# Patient Record
Sex: Female | Born: 1965 | Race: White | Hispanic: No | Marital: Single | Smoking: Never smoker
Health system: Southern US, Community
[De-identification: ages and names within clinical notes are randomized; demographics above are authoritative.]

## PROBLEM LIST (undated history)

## (undated) DIAGNOSIS — C569 Malignant neoplasm of unspecified ovary: Secondary | ICD-10-CM

## (undated) DIAGNOSIS — N390 Urinary tract infection, site not specified: Secondary | ICD-10-CM

## (undated) DIAGNOSIS — E210 Primary hyperparathyroidism: Secondary | ICD-10-CM

## (undated) DIAGNOSIS — R112 Nausea with vomiting, unspecified: Secondary | ICD-10-CM

## (undated) DIAGNOSIS — I10 Essential (primary) hypertension: Secondary | ICD-10-CM

## (undated) DIAGNOSIS — Z9889 Other specified postprocedural states: Secondary | ICD-10-CM

## (undated) DIAGNOSIS — C801 Malignant (primary) neoplasm, unspecified: Secondary | ICD-10-CM

## (undated) DIAGNOSIS — E042 Nontoxic multinodular goiter: Secondary | ICD-10-CM

## (undated) DIAGNOSIS — C55 Malignant neoplasm of uterus, part unspecified: Secondary | ICD-10-CM

## (undated) HISTORY — DX: Primary hyperparathyroidism: E21.0

## (undated) HISTORY — PX: CHOLECYSTECTOMY: SHX55

## (undated) HISTORY — DX: Nontoxic multinodular goiter: E04.2

## (undated) HISTORY — DX: Essential (primary) hypertension: I10

## (undated) HISTORY — DX: Malignant neoplasm of unspecified ovary: C56.9

---

## 2009-10-05 ENCOUNTER — Encounter: Admission: RE | Admit: 2009-10-05 | Discharge: 2009-10-05 | Payer: Self-pay | Admitting: Family Medicine

## 2010-02-18 ENCOUNTER — Encounter: Payer: Self-pay | Admitting: Family Medicine

## 2013-09-02 ENCOUNTER — Ambulatory Visit: Payer: BC Managed Care – PPO | Attending: Gynecologic Oncology | Admitting: Gynecologic Oncology

## 2013-09-02 ENCOUNTER — Encounter: Payer: Self-pay | Admitting: Gynecologic Oncology

## 2013-09-02 VITALS — BP 163/97 | HR 98 | Resp 16 | Ht 62.0 in | Wt 288.5 lb

## 2013-09-02 DIAGNOSIS — C541 Malignant neoplasm of endometrium: Secondary | ICD-10-CM

## 2013-09-02 DIAGNOSIS — C55 Malignant neoplasm of uterus, part unspecified: Secondary | ICD-10-CM | POA: Insufficient documentation

## 2013-09-02 DIAGNOSIS — C549 Malignant neoplasm of corpus uteri, unspecified: Secondary | ICD-10-CM

## 2013-09-02 NOTE — Progress Notes (Signed)
Consult Note: Gyn-Onc  Consult was requested by Dr. Marvel Plan for the evaluation of Shannon [name] 48 y.o. female  CC:  Chief Complaint  Patient presents with  . Uterine cancer    Assessment/Plan:  [name]  is a 48 y.o.  year old who is seen at the request of Dr Marvel Plan for grade 1 endometrial cancer in the setting of anovulatory bleeding and morbid obesity.  A detailed discussion was held with the patient and her family with regard to to her endometrial cancer diagnosis. We discussed the standard management options for uterine cancer which includes surgery followed possibly by adjuvant therapy depending on the results of surgery. The options for surgical management include a hysterectomy and removal of the tubes and ovaries possibly with removal of pelvic and para-aortic lymph nodes. A minimally invasive approach including a robotic hysterectomy or laparoscopic hysterectomy have benefits including shorter hospital stay, recovery time and better wound healing. The alternative approach is an open hysterectomy. The patient has been counseled about these surgical options and the risks of surgery in general including infection, bleeding, damage to surrounding structures (including bowel, bladder, ureters, nerves or vessels), and the postoperative risks of PE/ DVT, and lymphedema. I extensively reviewed the additional risks of robotic hysterectomy including possible need for conversion to open laparotomy.  I discussed positioning during surgery of trendelenberg and risks of minor facial swelling and care we take in preoperative positioning.  After counseling and consideration of her options, she desires to proceed with robotic hysterectomy, bilateral salpingo-oophorectomy and pelvic lymphadenectomy on 09/28/13. I discussed that she will be postmenopausal postoperatively I discussed that if her staging reveals an early stage lesion, it is safe to provide her with estrogen replacement therapy  postop until age of natural menopause. I discussed that after hysterectomy she will no longer be able to conceive a biologic child (she will have permanent infertility).  She will be seen by anesthesia for preoperative clearance and discussion of postoperative pain management.  She was given the opportunity to ask questions, which were answered to her satisfaction, and she is agreement with the above mentioned plan of care.    HPI: Shannon [name] is a 48 year old woman who has a 5 year history of abnormal uterine bleeding. She has experienced long episodes of amenorrhea followed by menorrhagia. Approximate 3 years ago she was treated with oral contraceptive pills to control cycling. This helped somewhat. In July 2015 she experienced particularly heavy uterine bleeding and was seen for evaluation by Dr. Marvel Plan. Dr. 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 She was prescribed aygestin to control the bleeding.  Interval History: The patient continues to have abnormal bleeding and intermittent spotting.  Current Meds:  Outpatient Encounter Prescriptions as of 09/02/2013  Medication Sig  . norethindrone (AYGESTIN) 5 MG tablet     Allergy:  Allergies  Allergen Reactions  . Tetanus Toxoids     Swelling at site, lethargic x few days    Social Hx:   History   Social History  . Marital Status: Single    Spouse Name: N/A    Number of Children: N/A  . Years of Education: N/A   Occupational History  . Not on file.   Social History Main Topics  . Smoking status: Never Smoker   . Smokeless tobacco: Not on file  . Alcohol Use: Not on file  . Drug Use: Not on file  . Sexual Activity: Not Currently  Other Topics Concern  . Not on file   Social History Narrative  . No narrative on file    Past Surgical Hx:  Past Surgical History  Procedure Laterality Date  . Cholecystectomy       Past  Medical Hx:  Past Medical History  Diagnosis Date  . Hypertension     Past Gynecological History:  G2P2 (SVD)  No LMP recorded.  Family Hx: History reviewed. No pertinent family history. No history of colon and endometrial cancer   Review of Systems:  Constitutional  Feels well,    ENT Normal appearing ears and nares bilaterally Skin/Breast  No rash, sores, jaundice, itching, dryness Cardiovascular  No chest pain, shortness of breath, or edema  Pulmonary  No cough or wheeze.  Gastro Intestinal  No nausea, vomitting, or diarrhoea. No bright red blood per rectum, no abdominal pain, change in bowel movement, or constipation.  Genito Urinary  No frequency, urgency, dysuria, see HPI Musculo Skeletal  No myalgia, arthralgia, joint swelling or pain  Neurologic  No weakness, numbness, change in gait,  Psychology  No depression, anxiety, insomnia.   Vitals:  Blood pressure 163/97, pulse 98, resp. rate 16, height 5\' 2"  (1.575 m), weight 288 lb 8 oz (130.863 kg).  Physical Exam: WD in NAD Neck  Supple NROM, without any enlargements.  Lymph Node Survey No cervical supraclavicular or inguinal adenopathy Cardiovascular  Pulse normal rate, regularity and rhythm. S1 and S2 normal.  Lungs  Clear to auscultation bilateraly, without wheezes/crackles/rhonchi. Good air movement.  Skin  No rash/lesions/breakdown  Psychiatry  Alert and oriented to person, place, and time  Abdomen  Normoactive bowel sounds, abdomen soft, non-tender and morbidly obese without evidence of hernia.  Back No CVA tenderness Genito Urinary  Vulva/vagina: Normal external female genitalia.  No lesions. No discharge or bleeding.  Bladder/urethra:  No lesions or masses, well supported bladder  Vagina: normal rugation and support  Cervix: Normal appearing, no lesions.  Uterus: Small, mobile, no parametrial involvement or nodularity. Exam limited secondary to body habitus.  Adnexa: no palpable masses. Rectal   Good tone, no masses no cul de sac nodularity.  Extremities  No bilateral cyanosis, clubbing or edema.   Donaciano Eva, MD   09/02/2013, 3:20 PM

## 2013-09-02 NOTE — Patient Instructions (Signed)
Your surgery is scheduled for Sept                 Preparing for your Surgery  Pre-operative Testing -You will receive a phone call from presurgical testing at [facility] to arrange for a pre-operative testing appointment before your surgery.  This appointment normally occurs one to two weeks before your scheduled surgery.   -Bring your insurance card, copy of an advanced directive if applicable, medication list  -At that visit, you will be asked to sign a consent for a possible blood transfusion in case a transfusion becomes necessary during surgery.  The need for a blood transfusion is rare but having consent is a necessary part of your care.     Day Before Surgery at [place] will be asked to take in only clear liquids the day before surgery.  Examples of clear liquids include broths, jello, and clear juices.  You may also be advised to perform a Miralax bowel prep or fleets enema the night before your surgery based off of your provider's recommendations.  You will be advised to have nothing to eat or drink after midnight the evening before.    Your role in recovery Your role is to become active as soon as directed by your doctor, while still giving yourself time to heal.  Rest when you feel tired. You will be asked to do the following in order to speed your recovery:  - Cough and breathe deeply. This helps toclear and expand your lungs and can prevent pneumonia. You may be given a spirometer to practice deep breathing. A staff member will show you how to use the spirometer. - Do mild physical activity. Walking or moving your legs help your circulation and body functions return to normal. A staff member will help you when you try to walk and will provide you with simple exercises. Do not try to get up or walk alone the first time. - Actively manage your pain. Managing your pain lets you move in comfort. We will ask you to rate your pain on a scale of zero to 10. It is your  responsibility to tell your doctor or nurse where and how much you hurt so your pain can be treated.  Special Considerations -If you are diabetic, you may be placed on insulin after surgery to have closer control over your blood sugars to promote healing and recovery.  This does not mean that you will be discharged on insulin.  If applicable, your oral antidiabetics will be resumed when you are tolerating a solid diet.  -Your final pathology results from surgery should be available by the Friday after surgery and the results will be relayed to you when available.  Hysterectomy Information  A hysterectomy is a surgery in which your uterus is removed. This surgery may be done to treat various medical problems. After the surgery, you will no longer have menstrual periods. The surgery will also make you unable to become pregnant (sterile). The fallopian tubes and ovaries can be removed (bilateral salpingo-oophorectomy) during this surgery as well.  REASONS FOR A HYSTERECTOMY  Persistent, abnormal bleeding.  Lasting (chronic) pelvic pain or infection.  The lining of the uterus (endometrium) starts growing outside the uterus (endometriosis).  The endometrium starts growing in the muscle of the uterus (adenomyosis).  The uterus falls down into the vagina (pelvic organ prolapse).  Noncancerous growths in the uterus (uterine fibroids) that cause symptoms.  Precancerous cells.  Cervical cancer or uterine cancer.  TYPES OF HYSTERECTOMIES  Supracervical hysterectomy--In this type, the top part of the uterus is removed, but not the cervix.  Total hysterectomy--The uterus and cervix are removed.  Radical hysterectomy--The uterus, the cervix, and the fibrous tissue that holds the uterus in place in the pelvis (parametrium) are removed. WAYS A HYSTERECTOMY CAN BE PERFORMED  Abdominal hysterectomy--A large surgical cut (incision) is made in the abdomen. The uterus is removed through this  incision.  Vaginal hysterectomy--An incision is made in the vagina. The uterus is removed through this incision. There are no abdominal incisions.  Conventional laparoscopic hysterectomy--Three or four small incisions are made in the abdomen. A thin, lighted tube with a camera (laparoscope) is inserted into one of the incisions. Other tools are put through the other incisions. The uterus is cut into small pieces. The small pieces are removed through the incisions, or they are removed through the vagina.  Laparoscopically assisted vaginal hysterectomy (LAVH)--Three or four small incisions are made in the abdomen. Part of the surgery is performed laparoscopically and part vaginally. The uterus is removed through the vagina.  Robot-assisted laparoscopic hysterectomy--A laparoscope and other tools are inserted into 3 or 4 small incisions in the abdomen. A computer-controlled device is used to give the surgeon a 3D image and to help control the surgical instruments. This allows for more precise movements of surgical instruments. The uterus is cut into small pieces and removed through the incisions or removed through the vagina. RISKS AND COMPLICATIONS  Possible complications associated with this procedure include:  Bleeding and risk of blood transfusion. Tell your health care provider if you do not want to receive any blood products.  Blood clots in the legs or lung.  Infection.  Injury to surrounding organs.  Problems or side effects related to anesthesia.  Conversion to an abdominal hysterectomy from one of the other techniques. WHAT TO EXPECT AFTER A HYSTERECTOMY  You will be given pain medicine.  You will need to have someone with you for the first 3-5 days after you go home.  You will need to follow up with your surgeon in 2-4 weeks after surgery to evaluate your progress.  You may have early menopause symptoms such as hot flashes, night sweats, and insomnia.  If you had a  hysterectomy for a problem that was not cancer or not a condition that could lead to cancer, then you no longer need Pap tests. However, even if you no longer need a Pap test, a regular exam is a good idea to make sure no other problems are starting. Document Released: 07/10/2000 Document Revised: 11/04/2012 Document Reviewed: 09/21/2012 [facility] Patient Information 2015 [place], [place]. This information is not intended to replace advice given to you by your health care provider. Make sure you discuss any questions you have with your health care provider.

## 2013-09-20 ENCOUNTER — Encounter (HOSPITAL_COMMUNITY): Payer: Self-pay | Admitting: Pharmacist

## 2013-09-23 ENCOUNTER — Ambulatory Visit (HOSPITAL_COMMUNITY)
Admission: RE | Admit: 2013-09-23 | Discharge: 2013-09-23 | Disposition: A | Discharge status: Home / Self Care | Payer: BC Managed Care – PPO | Source: Ambulatory Visit | Attending: Gynecologic Oncology | Admitting: Gynecologic Oncology

## 2013-09-23 ENCOUNTER — Encounter (HOSPITAL_COMMUNITY)
Admission: RE | Admit: 2013-09-23 | Discharge: 2013-09-23 | Disposition: A | Discharge status: Home / Self Care | Payer: BC Managed Care – PPO | Source: Ambulatory Visit | Attending: Gynecologic Oncology | Admitting: Gynecologic Oncology

## 2013-09-23 ENCOUNTER — Encounter (HOSPITAL_COMMUNITY): Payer: Self-pay

## 2013-09-23 DIAGNOSIS — C55 Malignant neoplasm of uterus, part unspecified: Secondary | ICD-10-CM | POA: Diagnosis not present

## 2013-09-23 DIAGNOSIS — Z01818 Encounter for other preprocedural examination: Secondary | ICD-10-CM | POA: Insufficient documentation

## 2013-09-23 HISTORY — DX: Urinary tract infection, site not specified: N39.0

## 2013-09-23 HISTORY — DX: Other specified postprocedural states: Z98.890

## 2013-09-23 HISTORY — DX: Malignant (primary) neoplasm, unspecified: C80.1

## 2013-09-23 HISTORY — DX: Nausea with vomiting, unspecified: R11.2

## 2013-09-23 LAB — URINALYSIS, ROUTINE W REFLEX MICROSCOPIC
Bilirubin Urine: NEGATIVE
Glucose, UA: NEGATIVE mg/dL
Ketones, ur: NEGATIVE mg/dL
NITRITE: NEGATIVE
PH: 6 (ref 5.0–8.0)
Protein, ur: NEGATIVE mg/dL
SPECIFIC GRAVITY, URINE: 1.023 (ref 1.005–1.030)
Urobilinogen, UA: 1 mg/dL (ref 0.0–1.0)

## 2013-09-23 LAB — URINE MICROSCOPIC-ADD ON

## 2013-09-23 LAB — HCG, SERUM, QUALITATIVE: Preg, Serum: NEGATIVE

## 2013-09-23 LAB — CBC WITH DIFFERENTIAL/PLATELET
BASOS PCT: 0 % (ref 0–1)
Basophils Absolute: 0 10*3/uL (ref 0.0–0.1)
EOS ABS: 0.1 10*3/uL (ref 0.0–0.7)
Eosinophils Relative: 1 % (ref 0–5)
HCT: 34.7 % — ABNORMAL LOW (ref 36.0–46.0)
Hemoglobin: 11 g/dL — ABNORMAL LOW (ref 12.0–15.0)
LYMPHS ABS: 1.7 10*3/uL (ref 0.7–4.0)
Lymphocytes Relative: 20 % (ref 12–46)
MCH: 27.3 pg (ref 26.0–34.0)
MCHC: 31.7 g/dL (ref 30.0–36.0)
MCV: 86.1 fL (ref 78.0–100.0)
Monocytes Absolute: 0.7 10*3/uL (ref 0.1–1.0)
Monocytes Relative: 8 % (ref 3–12)
NEUTROS PCT: 71 % (ref 43–77)
Neutro Abs: 5.8 10*3/uL (ref 1.7–7.7)
PLATELETS: 291 10*3/uL (ref 150–400)
RBC: 4.03 MIL/uL (ref 3.87–5.11)
RDW: 13.1 % (ref 11.5–15.5)
WBC: 8.3 10*3/uL (ref 4.0–10.5)

## 2013-09-23 LAB — COMPREHENSIVE METABOLIC PANEL
ALBUMIN: 4.2 g/dL (ref 3.5–5.2)
ALK PHOS: 57 U/L (ref 39–117)
ALT: 13 U/L (ref 0–35)
AST: 14 U/L (ref 0–37)
Anion gap: 12 (ref 5–15)
BUN: 8 mg/dL (ref 6–23)
CO2: 24 mEq/L (ref 19–32)
Calcium: 10.8 mg/dL — ABNORMAL HIGH (ref 8.4–10.5)
Chloride: 102 mEq/L (ref 96–112)
Creatinine, Ser: 0.65 mg/dL (ref 0.50–1.10)
GFR calc Af Amer: >90 mL/min (ref >90)
GFR calc non Af Amer: >90 mL/min (ref >90)
Glucose, Bld: 86 mg/dL (ref 70–99)
POTASSIUM: 4.8 meq/L (ref 3.7–5.3)
SODIUM: 138 meq/L (ref 137–147)
TOTAL PROTEIN: 7.5 g/dL (ref 6.0–8.3)
Total Bilirubin: 0.4 mg/dL (ref 0.3–1.2)

## 2013-09-23 LAB — ABO/RH: ABO/RH(D): O POS

## 2013-09-23 NOTE — Pre-Procedure Instructions (Addendum)
09-23-13 EKG/ CXR done today 09-23-13 labs viewable in Epic-faxed to Dr. Serita Grit office [phone], note urinalysis.

## 2013-09-23 NOTE — Patient Instructions (Addendum)
[address] Shannon [name]  09/23/2013   Your procedure is scheduled on: 9-1  -2015 Tuesday  Enter through [facility] Entrance and follow signs to [facility]. Arrive at    0800    AM..  Call this number if you have problems the morning of surgery: [phone]  Or Presurgical Testing [phone].   For Living Will and/or Health Care Power Attorney Forms: please provide copy for your medical record,may bring AM of surgery(Forms should be already notarized -we do not provide this service).(09-23-13  No information preferred today).  Remember: Follow any bowel prep instructions per MD office.(Clear Liquid diet x 24 hours prior to surgery).    Do not eat food/ or drink: After Midnight.     CLEAR LIQUID DIET   Foods Allowed                                                                     Foods Excluded  Coffee and tea, regular and decaf                             liquids that you cannot  Plain Jell-O in any flavor                                             see through such as: Fruit ices (not with fruit pulp)                                     milk, soups, orange juice  Iced Popsicles                                    All solid food Carbonated beverages, regular and diet                                    Cranberry, grape and apple juices Sports drinks like Gatorade Lightly seasoned clear broth or consume(fat free) Sugar, honey syrup  Sample Menu Breakfast                                Lunch                                     Supper Cranberry juice                    Beef broth                            Chicken broth Jell-O  Grape juice                           Apple juice Coffee or tea                        Jell-O                                      Popsicle                                                Coffee or tea                        Coffee or  tea  _____________________________________________________________________    Take these medicines the morning of surgery with A SIP OF WATER: none   Do not wear jewelry, make-up or nail polish.  Do not wear lotions, powders, or perfumes. You may wear deodorant.  Do not shave 48 hours(2 days) prior to first CHG shower(legs and under arms).(Shaving face and neck okay.)  Do not bring valuables to the hospital.(Hospital is not responsible for lost valuables).  Contacts, dentures or removable bridgework, body piercing, hair pins may not be worn into surgery.  Leave suitcase in the car. After surgery it may be brought to your room.  For patients admitted to the hospital, checkout time is 11:00 AM the day of discharge.(Restricted visitors-Any Persons displaying flu-like symptoms or illness).    Patients discharged the day of surgery will not be allowed to drive home. Must have responsible person with you x 24 hours once discharged.  Name and phone number of your driver: Shannon [name] ,sister [phone] h  Special Instructions: CHG(Chlorhedine 4%-"Hibiclens","Betasept","Aplicare") Shower Use Special Wash: see special instructions.(avoid face and genitals)   Please read over the following fact sheets that you were given: Incentive Spirometry Instruction.  Remember : Type/Screen "Blue armbands" - may not be removed once applied(would result in being retested AM of surgery, if removed).   _______________________    [facility] - Preparing for Surgery Before surgery, you can play an important role.  Because skin is not sterile, your skin needs to be as free of germs as possible.  You can reduce the number of germs on your skin by washing with CHG (chlorahexidine gluconate) soap before surgery.  CHG is an antiseptic cleaner which kills germs and bonds with the skin to continue killing germs even after washing. Please DO NOT use if you have an allergy to CHG or antibacterial soaps.  If your skin  becomes reddened/irritated stop using the CHG and inform your nurse when you arrive at Short Stay. Do not shave (including legs and underarms) for at least 48 hours prior to the first CHG shower.  You may shave your face/neck. Please follow these instructions carefully:  1.  Shower with CHG Soap the night before surgery and the  morning of Surgery.  2.  If you choose to wash your hair, wash your hair first as usual with your  normal  shampoo.  3.  After you shampoo, rinse your hair and body thoroughly to remove the  shampoo.  4.  Use CHG as you would any other liquid soap.  You can apply chg directly  to the skin and wash                       Gently with a scrungie or clean washcloth.  5.  Apply the CHG Soap to your body ONLY FROM THE NECK DOWN.   Do not use on face/ open                           Wound or open sores. Avoid contact with eyes, ears mouth and genitals (private parts).                       Wash face,  Genitals (private parts) with your normal soap.             6.  Wash thoroughly, paying special attention to the area where your surgery  will be performed.  7.  Thoroughly rinse your body with warm water from the neck down.  8.  DO NOT shower/wash with your normal soap after using and rinsing off  the CHG Soap.                9.  Pat yourself dry with a clean towel.            10.  Wear clean pajamas.            11.  Place clean sheets on your bed the night of your first shower and do not  sleep with pets. Day of Surgery : Do not apply any lotions/deodorants the morning of surgery.  Please wear clean clothes to the hospital/surgery center.  FAILURE TO FOLLOW THESE INSTRUCTIONS MAY RESULT IN THE CANCELLATION OF YOUR SURGERY PATIENT SIGNATURE_________________________________  NURSE SIGNATURE__________________________________  ________________________________________________________________________   Shannon [name]  An incentive spirometer is a  tool that can help keep your lungs clear and active. This tool measures how well you are filling your lungs with each breath. Taking long deep breaths may help reverse or decrease the chance of developing breathing (pulmonary) problems (especially infection) following:  A long period of time when you are unable to move or be active. BEFORE THE PROCEDURE   If the spirometer includes an indicator to show your best effort, your nurse or respiratory therapist will set it to a desired goal.  If possible, sit up straight or lean slightly forward. Try not to slouch.  Hold the incentive spirometer in an upright position. INSTRUCTIONS FOR USE  1. Sit on the edge of your bed if possible, or sit up as far as you can in bed or on a chair. 2. Hold the incentive spirometer in an upright position. 3. Breathe out normally. 4. Place the mouthpiece in your mouth and seal your lips tightly around it. 5. Breathe in slowly and as deeply as possible, raising the piston or the ball toward the top of the column. 6. Hold your breath for 3-5 seconds or for as long as possible. Allow the piston or ball to fall to the bottom of the column. 7. Remove the mouthpiece from your mouth and breathe out normally. 8. Rest for a few seconds and repeat Steps 1 through 7 at least 10 times every 1-2 hours when you are awake. Take your time and take a few normal breaths between deep breaths. 9. The spirometer may include an indicator to show  your best effort. Use the indicator as a goal to work toward during each repetition. 10. After each set of 10 deep breaths, practice coughing to be sure your lungs are clear. If you have an incision (the cut made at the time of surgery), support your incision when coughing by placing a pillow or rolled up towels firmly against it. Once you are able to get out of bed, walk around indoors and cough well. You may stop using the incentive spirometer when instructed by your caregiver.  RISKS AND  COMPLICATIONS  Take your time so you do not get dizzy or light-headed.  If you are in pain, you may need to take or ask for pain medication before doing incentive spirometry. It is harder to take a deep breath if you are having pain. AFTER USE  Rest and breathe slowly and easily.  It can be helpful to keep track of a log of your progress. Your caregiver can provide you with a simple table to help with this. If you are using the spirometer at home, follow these instructions: [place] IF:   You are having difficultly using the spirometer.  You have trouble using the spirometer as often as instructed.  Your pain medication is not giving enough relief while using the spirometer.  You develop fever of 100.5 F (38.1 C) or higher. SEEK IMMEDIATE MEDICAL CARE IF:   You cough up bloody sputum that had not been present before.  You develop fever of 102 F (38.9 C) or greater.  You develop worsening pain at or near the incision site. MAKE SURE YOU:   Understand these instructions.  Will watch your condition.  Will get help right away if you are not doing well or get worse. Document Released: 05/27/2006 Document Revised: 04/08/2011 Document Reviewed: 07/28/2006 Exit[employer] Patient Information 2014 [employer], [place].   ________________________________________________________________________  WHAT IS A BLOOD TRANSFUSION? Blood Transfusion Information  A transfusion is the replacement of blood or some of its parts. Blood is made up of multiple cells which provide different functions.  Red blood cells carry oxygen and are used for blood loss replacement.  White blood cells fight against infection.  Platelets control bleeding.  Plasma helps clot blood.  Other blood products are available for specialized needs, such as hemophilia or other clotting disorders. BEFORE THE TRANSFUSION  Who gives blood for transfusions?   Healthy volunteers who are fully evaluated to make sure  their blood is safe. This is blood bank blood. Transfusion therapy is the safest it has ever been in the practice of medicine. Before blood is taken from a donor, a complete history is taken to make sure that person has no history of diseases nor engages in risky social behavior (examples are intravenous drug use or sexual activity with multiple partners). The donor's travel history is screened to minimize risk of transmitting infections, such as malaria. The donated blood is tested for signs of infectious diseases, such as HIV and hepatitis. The blood is then tested to be sure it is compatible with you in order to minimize the chance of a transfusion reaction. If you or a relative donates blood, this is often done in anticipation of surgery and is not appropriate for emergency situations. It takes many days to process the donated blood. RISKS AND COMPLICATIONS Although transfusion therapy is very safe and saves many lives, the main dangers of transfusion include:   Getting an infectious disease.  Developing a transfusion reaction. This is an allergic reaction to  something in the blood you were given. Every precaution is taken to prevent this. The decision to have a blood transfusion has been considered carefully by your caregiver before blood is given. Blood is not given unless the benefits outweigh the risks. AFTER THE TRANSFUSION  Right after receiving a blood transfusion, you will usually feel much better and more energetic. This is especially true if your red blood cells have gotten low (anemic). The transfusion raises the level of the red blood cells which carry oxygen, and this usually causes an energy increase.  The nurse administering the transfusion will monitor you carefully for complications. HOME CARE INSTRUCTIONS  No special instructions are needed after a transfusion. You may find your energy is better. Speak with your caregiver about any limitations on activity for underlying diseases  you may have. SEEK MEDICAL CARE IF:   Your condition is not improving after your transfusion.  You develop redness or irritation at the intravenous (IV) site. SEEK IMMEDIATE MEDICAL CARE IF:  Any of the following symptoms occur over the next 12 hours:  Shaking chills.  You have a temperature by mouth above 102 F (38.9 C), not controlled by medicine.  Chest, back, or muscle pain.  People around you feel you are not acting correctly or are confused.  Shortness of breath or difficulty breathing.  Dizziness and fainting.  You get a rash or develop hives.  You have a decrease in urine output.  Your urine turns a dark color or changes to pink, red, or brown. Any of the following symptoms occur over the next 10 days:  You have a temperature by mouth above 102 F (38.9 C), not controlled by medicine.  Shortness of breath.  Weakness after normal activity.  The white part of the eye turns yellow (jaundice).  You have a decrease in the amount of urine or are urinating less often.  Your urine turns a dark color or changes to pink, red, or brown. Document Released: 01/12/2000 Document Revised: 04/08/2011 Document Reviewed: 08/31/2007 [facility] Patient Information 2014 [place], [place].  _______________________________________________________________________

## 2013-09-23 NOTE — Progress Notes (Signed)
09-23-13 labs viewable in Epic, please note urinalysis. Would need to order urine culture and notify Lab 02-13 if culture needed preop.

## 2013-09-27 ENCOUNTER — Telehealth: Payer: Self-pay | Admitting: Gynecologic Oncology

## 2013-09-27 MED ORDER — DEXTROSE 5 % IV SOLN
3.0000 g | INTRAVENOUS | Status: AC
Start: 2013-09-28 — End: 2013-09-28
  Administered 2013-09-28: 3 g via INTRAVENOUS
  Filled 2013-09-27: qty 3000

## 2013-09-27 NOTE — Telephone Encounter (Signed)
Telephone call to check on pre-operative status.  Patient complaint with pre-operative instructions.  Reinforced NPO after midnight.  No questions or concerns voiced.  Instructed to call for any needs.

## 2013-09-28 ENCOUNTER — Encounter (HOSPITAL_COMMUNITY)
Admission: RE | Disposition: A | Discharge status: Home / Self Care | Payer: Self-pay | Source: Ambulatory Visit | Attending: Gynecologic Oncology

## 2013-09-28 ENCOUNTER — Encounter (HOSPITAL_COMMUNITY): Payer: BC Managed Care – PPO | Admitting: Anesthesiology

## 2013-09-28 ENCOUNTER — Encounter (HOSPITAL_COMMUNITY): Payer: Self-pay

## 2013-09-28 ENCOUNTER — Ambulatory Visit (HOSPITAL_COMMUNITY)
Admission: RE | Admit: 2013-09-28 | Discharge: 2013-09-29 | Disposition: A | Discharge status: Home / Self Care | Payer: BC Managed Care – PPO | Source: Ambulatory Visit | Attending: Gynecologic Oncology | Admitting: Gynecologic Oncology

## 2013-09-28 ENCOUNTER — Ambulatory Visit (HOSPITAL_COMMUNITY): Payer: BC Managed Care – PPO | Admitting: Anesthesiology

## 2013-09-28 DIAGNOSIS — N83209 Unspecified ovarian cyst, unspecified side: Secondary | ICD-10-CM | POA: Insufficient documentation

## 2013-09-28 DIAGNOSIS — Z6841 Body Mass Index (BMI) 40.0 and over, adult: Secondary | ICD-10-CM | POA: Diagnosis not present

## 2013-09-28 DIAGNOSIS — C541 Malignant neoplasm of endometrium: Secondary | ICD-10-CM | POA: Diagnosis present

## 2013-09-28 DIAGNOSIS — Z887 Allergy status to serum and vaccine status: Secondary | ICD-10-CM | POA: Diagnosis not present

## 2013-09-28 DIAGNOSIS — C549 Malignant neoplasm of corpus uteri, unspecified: Secondary | ICD-10-CM | POA: Diagnosis present

## 2013-09-28 DIAGNOSIS — N970 Female infertility associated with anovulation: Secondary | ICD-10-CM | POA: Diagnosis not present

## 2013-09-28 DIAGNOSIS — N841 Polyp of cervix uteri: Secondary | ICD-10-CM | POA: Diagnosis not present

## 2013-09-28 DIAGNOSIS — C55 Malignant neoplasm of uterus, part unspecified: Secondary | ICD-10-CM

## 2013-09-28 HISTORY — PX: ROBOTIC ASSISTED TOTAL HYSTERECTOMY WITH BILATERAL SALPINGO OOPHERECTOMY: SHX6086

## 2013-09-28 HISTORY — DX: Malignant neoplasm of uterus, part unspecified: C55

## 2013-09-28 LAB — TYPE AND SCREEN
ABO/RH(D): O POS
Antibody Screen: NEGATIVE

## 2013-09-28 SURGERY — ROBOTIC ASSISTED TOTAL HYSTERECTOMY WITH BILATERAL SALPINGO OOPHORECTOMY
Anesthesia: General | Laterality: Bilateral

## 2013-09-28 MED ORDER — DEXAMETHASONE SODIUM PHOSPHATE 10 MG/ML IJ SOLN
INTRAMUSCULAR | Status: AC
  Filled 2013-09-28: qty 1

## 2013-09-28 MED ORDER — PROPOFOL 10 MG/ML IV BOLUS
INTRAVENOUS | Status: AC
  Filled 2013-09-28: qty 20

## 2013-09-28 MED ORDER — KETOROLAC TROMETHAMINE 30 MG/ML IJ SOLN
30.0000 mg | Freq: Four times a day (QID) | INTRAMUSCULAR | Status: DC
  Administered 2013-09-28 – 2013-09-29 (×3): 30 mg via INTRAVENOUS
  Filled 2013-09-28 (×4): qty 1

## 2013-09-28 MED ORDER — HYDROMORPHONE HCL PF 2 MG/ML IJ SOLN
INTRAMUSCULAR | Status: AC
  Filled 2013-09-28: qty 1

## 2013-09-28 MED ORDER — MIDAZOLAM HCL 5 MG/5ML IJ SOLN
INTRAMUSCULAR | Status: DC | PRN
  Administered 2013-09-28 (×2): 1 mg via INTRAVENOUS

## 2013-09-28 MED ORDER — METOCLOPRAMIDE HCL 5 MG/ML IJ SOLN
INTRAMUSCULAR | Status: DC | PRN
  Administered 2013-09-28: 10 mg via INTRAVENOUS

## 2013-09-28 MED ORDER — BUPIVACAINE LIPOSOME 1.3 % IJ SUSP
20.0000 mL | Freq: Once | INTRAMUSCULAR | Status: AC
  Administered 2013-09-28: 20 mL
  Filled 2013-09-28 (×2): qty 20

## 2013-09-28 MED ORDER — ONDANSETRON HCL 4 MG/2ML IJ SOLN
INTRAMUSCULAR | Status: DC | PRN
  Administered 2013-09-28: 4 mg via INTRAVENOUS

## 2013-09-28 MED ORDER — EPHEDRINE SULFATE 50 MG/ML IJ SOLN
INTRAMUSCULAR | Status: DC | PRN
  Administered 2013-09-28 (×2): 10 mg via INTRAVENOUS
  Administered 2013-09-28: 5 mg via INTRAVENOUS

## 2013-09-28 MED ORDER — FENTANYL CITRATE 0.05 MG/ML IJ SOLN
INTRAMUSCULAR | Status: DC | PRN
  Administered 2013-09-28 (×7): 50 ug via INTRAVENOUS

## 2013-09-28 MED ORDER — KETOROLAC TROMETHAMINE 30 MG/ML IJ SOLN
30.0000 mg | Freq: Four times a day (QID) | INTRAMUSCULAR | Status: DC
  Filled 2013-09-28 (×4): qty 1

## 2013-09-28 MED ORDER — PROPOFOL 10 MG/ML IV BOLUS
INTRAVENOUS | Status: DC | PRN
  Administered 2013-09-28: 200 mg via INTRAVENOUS

## 2013-09-28 MED ORDER — GLYCOPYRROLATE 0.2 MG/ML IJ SOLN
INTRAMUSCULAR | Status: AC
  Filled 2013-09-28: qty 3

## 2013-09-28 MED ORDER — STERILE WATER FOR IRRIGATION IR SOLN
Status: DC | PRN
  Administered 2013-09-28: 3000 mL

## 2013-09-28 MED ORDER — KCL IN DEXTROSE-NACL 20-5-0.45 MEQ/L-%-% IV SOLN
INTRAVENOUS | Status: DC
  Administered 2013-09-28 – 2013-09-29 (×2): via INTRAVENOUS
  Filled 2013-09-28 (×4): qty 1000

## 2013-09-28 MED ORDER — ONDANSETRON HCL 4 MG/2ML IJ SOLN
INTRAMUSCULAR | Status: AC
  Filled 2013-09-28: qty 2

## 2013-09-28 MED ORDER — SODIUM CHLORIDE 0.9 % IJ SOLN
INTRAMUSCULAR | Status: AC
  Filled 2013-09-28: qty 20

## 2013-09-28 MED ORDER — ONDANSETRON HCL 4 MG/2ML IJ SOLN: 4.0000 mg | Freq: Four times a day (QID) | INTRAMUSCULAR | Status: DC | PRN

## 2013-09-28 MED ORDER — LACTATED RINGERS IV SOLN
INTRAVENOUS | Status: DC
  Administered 2013-09-28 (×2): via INTRAVENOUS
  Administered 2013-09-28: 1000 mL via INTRAVENOUS

## 2013-09-28 MED ORDER — ROCURONIUM BROMIDE 100 MG/10ML IV SOLN
INTRAVENOUS | Status: AC
  Filled 2013-09-28: qty 1

## 2013-09-28 MED ORDER — METOCLOPRAMIDE HCL 5 MG/ML IJ SOLN
INTRAMUSCULAR | Status: AC
  Filled 2013-09-28: qty 2

## 2013-09-28 MED ORDER — MIDAZOLAM HCL 2 MG/2ML IJ SOLN
INTRAMUSCULAR | Status: AC
  Filled 2013-09-28: qty 2

## 2013-09-28 MED ORDER — ROCURONIUM BROMIDE 100 MG/10ML IV SOLN
INTRAVENOUS | Status: DC | PRN
  Administered 2013-09-28 (×2): 10 mg via INTRAVENOUS
  Administered 2013-09-28: 50 mg via INTRAVENOUS
  Administered 2013-09-28: 10 mg via INTRAVENOUS

## 2013-09-28 MED ORDER — HYDROMORPHONE HCL PF 1 MG/ML IJ SOLN
INTRAMUSCULAR | Status: AC
  Filled 2013-09-28: qty 1

## 2013-09-28 MED ORDER — SCOPOLAMINE 1 MG/3DAYS TD PT72
MEDICATED_PATCH | TRANSDERMAL | Status: AC
  Filled 2013-09-28: qty 1

## 2013-09-28 MED ORDER — HYDROMORPHONE HCL PF 1 MG/ML IJ SOLN
0.2500 mg | INTRAMUSCULAR | Status: DC | PRN
Start: 2013-09-28 — End: 2013-09-28
  Administered 2013-09-28: 0.5 mg via INTRAVENOUS

## 2013-09-28 MED ORDER — OXYCODONE-ACETAMINOPHEN 5-325 MG PO TABS
1.0000 | ORAL_TABLET | ORAL | Status: DC | PRN
  Administered 2013-09-29: 1 via ORAL
  Filled 2013-09-28: qty 1

## 2013-09-28 MED ORDER — ONDANSETRON HCL 4 MG PO TABS: 4.0000 mg | ORAL_TABLET | Freq: Four times a day (QID) | ORAL | Status: DC | PRN

## 2013-09-28 MED ORDER — DEXAMETHASONE SODIUM PHOSPHATE 4 MG/ML IJ SOLN
INTRAMUSCULAR | Status: DC | PRN
  Administered 2013-09-28: 10 mg via INTRAVENOUS

## 2013-09-28 MED ORDER — NEOSTIGMINE METHYLSULFATE 10 MG/10ML IV SOLN
INTRAVENOUS | Status: DC | PRN
  Administered 2013-09-28: 2 mg via INTRAVENOUS

## 2013-09-28 MED ORDER — ENOXAPARIN SODIUM 40 MG/0.4ML ~~LOC~~ SOLN
40.0000 mg | SUBCUTANEOUS | Status: AC
  Administered 2013-09-28: 40 mg via SUBCUTANEOUS
  Filled 2013-09-28: qty 0.4

## 2013-09-28 MED ORDER — FENTANYL CITRATE 0.05 MG/ML IJ SOLN
INTRAMUSCULAR | Status: AC
  Filled 2013-09-28: qty 2

## 2013-09-28 MED ORDER — ACETAMINOPHEN 10 MG/ML IV SOLN
1000.0000 mg | Freq: Once | INTRAVENOUS | Status: AC
  Administered 2013-09-28: 1000 mg via INTRAVENOUS
  Filled 2013-09-28: qty 100

## 2013-09-28 MED ORDER — EPHEDRINE SULFATE 50 MG/ML IJ SOLN
INTRAMUSCULAR | Status: AC
  Filled 2013-09-28: qty 1

## 2013-09-28 MED ORDER — NEOSTIGMINE METHYLSULFATE 10 MG/10ML IV SOLN
INTRAVENOUS | Status: AC
  Filled 2013-09-28: qty 1

## 2013-09-28 MED ORDER — LACTATED RINGERS IR SOLN
Status: DC | PRN
  Administered 2013-09-28: 1000 mL

## 2013-09-28 MED ORDER — GLYCOPYRROLATE 0.2 MG/ML IJ SOLN
INTRAMUSCULAR | Status: DC | PRN
  Administered 2013-09-28: 0.3 mg via INTRAVENOUS

## 2013-09-28 MED ORDER — HYDROMORPHONE HCL PF 1 MG/ML IJ SOLN
INTRAMUSCULAR | Status: DC | PRN
  Administered 2013-09-28: 0.5 mg via INTRAVENOUS

## 2013-09-28 MED ORDER — ENOXAPARIN SODIUM 40 MG/0.4ML ~~LOC~~ SOLN
40.0000 mg | SUBCUTANEOUS | Status: DC
  Administered 2013-09-29: 40 mg via SUBCUTANEOUS
  Filled 2013-09-28 (×2): qty 0.4

## 2013-09-28 MED ORDER — FENTANYL CITRATE 0.05 MG/ML IJ SOLN
INTRAMUSCULAR | Status: AC
  Filled 2013-09-28: qty 5

## 2013-09-28 MED ORDER — KETAMINE HCL 10 MG/ML IJ SOLN
INTRAMUSCULAR | Status: DC | PRN
  Administered 2013-09-28: 30 mg via INTRAVENOUS
  Administered 2013-09-28: 20 mg via INTRAVENOUS

## 2013-09-28 MED ORDER — PROMETHAZINE HCL 25 MG/ML IJ SOLN: 6.2500 mg | INTRAMUSCULAR | Status: DC | PRN

## 2013-09-28 MED ORDER — SCOPOLAMINE 1 MG/3DAYS TD PT72
MEDICATED_PATCH | TRANSDERMAL | Status: DC | PRN
  Administered 2013-09-28: 1 via TRANSDERMAL

## 2013-09-28 SURGICAL SUPPLY — 48 items
CABLE HIGH FREQUENCY MONO STRZ (ELECTRODE) ×2 IMPLANT
CHLORAPREP W/TINT 26ML (MISCELLANEOUS) ×2 IMPLANT
CORDS BIPOLAR (ELECTRODE) ×2 IMPLANT
COVER SURGICAL LIGHT HANDLE (MISCELLANEOUS) ×2 IMPLANT
COVER TIP SHEARS 8 DVNC (MISCELLANEOUS) ×1 IMPLANT
COVER TIP SHEARS 8MM DA VINCI (MISCELLANEOUS) ×1
DERMABOND ADVANCED (GAUZE/BANDAGES/DRESSINGS) ×1
DERMABOND ADVANCED .7 DNX12 (GAUZE/BANDAGES/DRESSINGS) ×1 IMPLANT
DRAPE LG THREE QUARTER DISP (DRAPES) ×4 IMPLANT
DRAPE SURG IRRIG POUCH 19X23 (DRAPES) ×2 IMPLANT
DRAPE TABLE BACK 44X90 PK DISP (DRAPES) ×4 IMPLANT
DRAPE WARM FLUID 44X44 (DRAPE) ×2 IMPLANT
DRSG TEGADERM 6X8 (GAUZE/BANDAGES/DRESSINGS) ×8 IMPLANT
ELECT REM PT RETURN 9FT ADLT (ELECTROSURGICAL) ×2
ELECTRODE REM PT RTRN 9FT ADLT (ELECTROSURGICAL) ×1 IMPLANT
GLOVE BIO SURGEON STRL SZ 6 (GLOVE) ×6 IMPLANT
GLOVE BIO SURGEON STRL SZ 6.5 (GLOVE) ×4 IMPLANT
GOWN STRL REUS W/ TWL LRG LVL4 (GOWN DISPOSABLE) ×3 IMPLANT
GOWN STRL REUS W/TWL LRG LVL3 (GOWN DISPOSABLE) ×6 IMPLANT
GOWN STRL REUS W/TWL LRG LVL4 (GOWN DISPOSABLE) ×3
HOLDER FOLEY CATH W/STRAP (MISCELLANEOUS) ×2 IMPLANT
KIT ACCESSORY DA VINCI DISP (KITS) ×1
KIT ACCESSORY DVNC DISP (KITS) ×1 IMPLANT
KIT BASIN OR (CUSTOM PROCEDURE TRAY) ×2 IMPLANT
MANIPULATOR UTERINE 4.5 ZUMI (MISCELLANEOUS) ×2 IMPLANT
OCCLUDER COLPOPNEUMO (BALLOONS) ×2 IMPLANT
PENCIL BUTTON HOLSTER BLD 10FT (ELECTRODE) ×2 IMPLANT
POUCH SPECIMEN RETRIEVAL 10MM (ENDOMECHANICALS) ×4 IMPLANT
SET TUBE IRRIG SUCTION NO TIP (IRRIGATION / IRRIGATOR) ×2 IMPLANT
SHEET LAVH (DRAPES) ×2 IMPLANT
SOLUTION ANTI FOG 6CC (MISCELLANEOUS) ×2 IMPLANT
SOLUTION ELECTROLUBE (MISCELLANEOUS) ×2 IMPLANT
SUT VIC AB 0 CT1 27 (SUTURE) ×2
SUT VIC AB 0 CT1 27XBRD ANTBC (SUTURE) ×2 IMPLANT
SUT VIC AB 4-0 PS2 27 (SUTURE) ×4 IMPLANT
SUT VICRYL 0 UR6 27IN ABS (SUTURE) ×2 IMPLANT
SYR 50ML LL SCALE MARK (SYRINGE) ×2 IMPLANT
TOWEL OR 17X26 10 PK STRL BLUE (TOWEL DISPOSABLE) ×4 IMPLANT
TOWEL OR NON WOVEN STRL DISP B (DISPOSABLE) ×2 IMPLANT
TRAP SPECIMEN MUCOUS 40CC (MISCELLANEOUS) IMPLANT
TRAY FOLEY CATH 14FRSI W/METER (CATHETERS) ×2 IMPLANT
TRAY LAP CHOLE (CUSTOM PROCEDURE TRAY) ×2 IMPLANT
TROCAR 12M 150ML BLUNT (TROCAR) ×2 IMPLANT
TROCAR BLADELESS OPT 5 100 (ENDOMECHANICALS) ×2 IMPLANT
TROCAR XCEL 12X100 BLDLESS (ENDOMECHANICALS) ×2 IMPLANT
TUBING INSUFFLATION 10FT LAP (TUBING) ×2 IMPLANT
WATER STERILE IRR 1500ML POUR (IV SOLUTION) ×2 IMPLANT
YANKAUER SUCT BULB TIP 10FT TU (MISCELLANEOUS) ×2 IMPLANT

## 2013-09-28 NOTE — Interval H&P Note (Signed)
History and Physical Interval Note:  09/28/2013 9:56 AM  Shannon [name]  has presented today for surgery, with the diagnosis of endometrial cancer  The various methods of treatment have been discussed with the patient and family. After consideration of risks, benefits and other options for treatment, the patient has consented to  Procedure(s): ROBOTIC ASSISTED TOTAL HYSTERECTOMY WITH BILATERAL SALPINGO OOPHORECTOMY WITH POSSIBLE LYMPH NODE DISECTION (Bilateral) as a surgical intervention .  The patient's history has been reviewed, patient examined, no change in status, stable for surgery.  I have reviewed the patient's chart and labs.  Questions were answered to the patient's satisfaction.     Donaciano Eva

## 2013-09-28 NOTE — H&P (View-Only) (Signed)
Consult Note: Gyn-Onc  Consult was requested by Dr. Marvel Plan for the evaluation of Shannon [name] 48 y.o. female  CC:  Chief Complaint  Patient presents with  . Uterine cancer    Assessment/Plan:  [name]  is a 48 y.o.  year old who is seen at the request of Dr Marvel Plan for grade 1 endometrial cancer in the setting of anovulatory bleeding and morbid obesity.  A detailed discussion was held with the patient and her family with regard to to her endometrial cancer diagnosis. We discussed the standard management options for uterine cancer which includes surgery followed possibly by adjuvant therapy depending on the results of surgery. The options for surgical management include a hysterectomy and removal of the tubes and ovaries possibly with removal of pelvic and para-aortic lymph nodes. A minimally invasive approach including a robotic hysterectomy or laparoscopic hysterectomy have benefits including shorter hospital stay, recovery time and better wound healing. The alternative approach is an open hysterectomy. The patient has been counseled about these surgical options and the risks of surgery in general including infection, bleeding, damage to surrounding structures (including bowel, bladder, ureters, nerves or vessels), and the postoperative risks of PE/ DVT, and lymphedema. I extensively reviewed the additional risks of robotic hysterectomy including possible need for conversion to open laparotomy.  I discussed positioning during surgery of trendelenberg and risks of minor facial swelling and care we take in preoperative positioning.  After counseling and consideration of her options, she desires to proceed with robotic hysterectomy, bilateral salpingo-oophorectomy and pelvic lymphadenectomy on 09/28/13. I discussed that she will be postmenopausal postoperatively I discussed that if her staging reveals an early stage lesion, it is safe to provide her with estrogen replacement therapy  postop until age of natural menopause. I discussed that after hysterectomy she will no longer be able to conceive a biologic child (she will have permanent infertility).  She will be seen by anesthesia for preoperative clearance and discussion of postoperative pain management.  She was given the opportunity to ask questions, which were answered to her satisfaction, and she is agreement with the above mentioned plan of care.    HPI: Shannon [name] is a 48 year old woman who has a 5 year history of abnormal uterine bleeding. She has experienced long episodes of amenorrhea followed by menorrhagia. Approximate 3 years ago she was treated with oral contraceptive pills to control cycling. This helped somewhat. In July 2015 she experienced particularly heavy uterine bleeding and was seen for evaluation by Dr. Marvel Plan. Dr. 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 She was prescribed aygestin to control the bleeding.  Interval History: The patient continues to have abnormal bleeding and intermittent spotting.  Current Meds:  Outpatient Encounter Prescriptions as of 09/02/2013  Medication Sig  . norethindrone (AYGESTIN) 5 MG tablet     Allergy:  Allergies  Allergen Reactions  . Tetanus Toxoids     Swelling at site, lethargic x few days    Social Hx:   History   Social History  . Marital Status: Single    Spouse Name: N/A    Number of Children: N/A  . Years of Education: N/A   Occupational History  . Not on file.   Social History Main Topics  . Smoking status: Never Smoker   . Smokeless tobacco: Not on file  . Alcohol Use: Not on file  . Drug Use: Not on file  . Sexual Activity: Not Currently  Other Topics Concern  . Not on file   Social History Narrative  . No narrative on file    Past Surgical Hx:  Past Surgical History  Procedure Laterality Date  . Cholecystectomy       Past  Medical Hx:  Past Medical History  Diagnosis Date  . Hypertension     Past Gynecological History:  G2P2 (SVD)  No LMP recorded.  Family Hx: History reviewed. No pertinent family history. No history of colon and endometrial cancer   Review of Systems:  Constitutional  Feels well,    ENT Normal appearing ears and nares bilaterally Skin/Breast  No rash, sores, jaundice, itching, dryness Cardiovascular  No chest pain, shortness of breath, or edema  Pulmonary  No cough or wheeze.  Gastro Intestinal  No nausea, vomitting, or diarrhoea. No bright red blood per rectum, no abdominal pain, change in bowel movement, or constipation.  Genito Urinary  No frequency, urgency, dysuria, see HPI Musculo Skeletal  No myalgia, arthralgia, joint swelling or pain  Neurologic  No weakness, numbness, change in gait,  Psychology  No depression, anxiety, insomnia.   Vitals:  Blood pressure 163/97, pulse 98, resp. rate 16, height 5\' 2"  (1.575 m), weight 288 lb 8 oz (130.863 kg).  Physical Exam: WD in NAD Neck  Supple NROM, without any enlargements.  Lymph Node Survey No cervical supraclavicular or inguinal adenopathy Cardiovascular  Pulse normal rate, regularity and rhythm. S1 and S2 normal.  Lungs  Clear to auscultation bilateraly, without wheezes/crackles/rhonchi. Good air movement.  Skin  No rash/lesions/breakdown  Psychiatry  Alert and oriented to person, place, and time  Abdomen  Normoactive bowel sounds, abdomen soft, non-tender and morbidly obese without evidence of hernia.  Back No CVA tenderness Genito Urinary  Vulva/vagina: Normal external female genitalia.  No lesions. No discharge or bleeding.  Bladder/urethra:  No lesions or masses, well supported bladder  Vagina: normal rugation and support  Cervix: Normal appearing, no lesions.  Uterus: Small, mobile, no parametrial involvement or nodularity. Exam limited secondary to body habitus.  Adnexa: no palpable masses. Rectal   Good tone, no masses no cul de sac nodularity.  Extremities  No bilateral cyanosis, clubbing or edema.   Donaciano Eva, MD   09/02/2013, 3:20 PM

## 2013-09-28 NOTE — Anesthesia Preprocedure Evaluation (Signed)
Anesthesia Evaluation  Patient identified by MRN, date of birth, ID band Patient awake    Reviewed: Allergy & Precautions, H&P , NPO status , Patient's Chart, lab work & pertinent test results  Airway Mallampati: III TM Distance: <3 FB Neck ROM: Full    Dental no notable dental hx.    Pulmonary neg pulmonary ROS,  breath sounds clear to auscultation  + decreased breath sounds      Cardiovascular hypertension, Rhythm:Regular Rate:Normal     Neuro/Psych negative neurological ROS  negative psych ROS   GI/Hepatic negative GI ROS, Neg liver ROS,   Endo/Other  Morbid obesity  Renal/GU negative Renal ROS  negative genitourinary   Musculoskeletal negative musculoskeletal ROS (+)   Abdominal (+) + obese,   Peds negative pediatric ROS (+)  Hematology negative hematology ROS (+)   Anesthesia Other Findings   Reproductive/Obstetrics negative OB ROS                           Anesthesia Physical Anesthesia Plan  ASA: III  Anesthesia Plan: General   Post-op Pain Management:    Induction: Intravenous  Airway Management Planned: Oral ETT  Additional Equipment:   Intra-op Plan:   Post-operative Plan: Extubation in OR  Informed Consent: I have reviewed the patients History and Physical, chart, labs and discussed the procedure including the risks, benefits and alternatives for the proposed anesthesia with the patient or authorized representative who has indicated his/her understanding and acceptance.   Dental advisory given  Plan Discussed with: CRNA and Surgeon  Anesthesia Plan Comments:         Anesthesia Quick Evaluation

## 2013-09-28 NOTE — Anesthesia Postprocedure Evaluation (Signed)
  Anesthesia Post-op Note  Patient: Shannon [name]  Procedure(s) Performed: Procedure(s) (LRB): ROBOTIC ASSISTED TOTAL HYSTERECTOMY WITH BILATERAL SALPINGO OOPHORECTOMY WITH  LYMPH NODE DISECTION,  (Bilateral)  Patient Location: PACU  Anesthesia Type: General  Level of Consciousness: awake and alert   Airway and Oxygen Therapy: Patient Spontanous Breathing  Post-op Pain: mild  Post-op Assessment: Post-op Vital signs reviewed, Patient's Cardiovascular Status Stable, Respiratory Function Stable, Patent Airway and No signs of Nausea or vomiting  Last Vitals:  Filed Vitals:   09/28/13 1430  BP: 127/76  Pulse: 89  Temp:   Resp: 16    Post-op Vital Signs: stable   Complications: No apparent anesthesia complications

## 2013-09-28 NOTE — Transfer of Care (Signed)
Immediate Anesthesia Transfer of Care Note  Patient: Shannon [name]  Procedure(s) Performed: Procedure(s): ROBOTIC ASSISTED TOTAL HYSTERECTOMY WITH BILATERAL SALPINGO OOPHORECTOMY WITH  LYMPH NODE DISECTION,  (Bilateral)  Patient Location: PACU  Anesthesia Type:General  Level of Consciousness: Patient easily awoken, sedated, comfortable, cooperative, following commands, responds to stimulation.   Airway & Oxygen Therapy: Patient spontaneously breathing, ventilating well, oxygen via simple oxygen mask.  Post-op Assessment: Report given to PACU RN, vital signs reviewed and stable, moving all extremities.   Post vital signs: Reviewed and stable.  Complications: No apparent anesthesia complications

## 2013-09-28 NOTE — Progress Notes (Signed)
Peripad put in place.

## 2013-09-28 NOTE — Progress Notes (Signed)
Peripad  In place- no drainage noted.

## 2013-09-28 NOTE — Op Note (Signed)
PATIENT: Shannon [name] DATE OF BIRTH: 10-23-1965 ENCOUNTER DATE: 9/1/5   Preop Diagnosis: grade 1 endometrioid adenocarcinoma.   Postoperative Diagnosis: same.   Surgery: Total robotic hysterectomy bilateral salpingo-oophorectomy (uterus >250gm), left and right pelvic lymph node dissection.   Surgeons:  Lenard Simmer. Denman George, MD; Lahoma Crocker, MD   Anesthesia: General   Estimated blood loss:  75 ml   IVF:  1200 ml   Urine output:  778 ml   Complications: None   Pathology: Uterus, cervix, bilateral tubes and ovaries, left and right pelvic lymph nodes to pathology.   Operative findings: 14 week size uterus, normal appearing ovaries, no suspicious nodes  Procedure: The patient was identified in the preoperative holding area. Informed consent was signed on the chart. Patient was seen history was reviewed and exam was performed.   The patient was then taken to the operating room and placed in the supine position with SCD hose on. She was then placed in the dorsolithotomy position. Her arms were tucked at her side with appropriate precautions on the gel pad. General anesthesia was then induced without difficulty. Shoulder blocks were then placed in the usual fashion with appropriate precautions. A OG-tube was placed to suction. First timeout was performed to confirm the patient, procedure, antibiotic, allergy status, estimated blood loss and OR time. The perineum was then prepped in the usual fashion with Betadine. A 14 French Foley was inserted into the bladder under sterile conditions. A sterile speculum was placed in the vagina. The cervix was without lesions. The cervix was grasped with a single-tooth tenaculum. The dilator without difficulty. A ZUMI with a large Koe ring was placed without difficulty. The abdomen was then prepped with 2 Chlor prep sponges per protocol.   Patient was then draped after the prep was dried. Second timeout was performed to confirm the above. After again  confirming OG tube placement and it was to suction. A stab-wound was made in left upper quadrant 2 cm below the costal margin on the left in the midclavicular line. A 5 mm operative report was used to assure intra-abdominal placement. The abdomen was insufflated. At this point all points during the procedure the patient's intra-abdominal pressure was not increased over 15 mm of mercury. After insufflation was complete, the patient was placed in deep Trendelenburg position. 25 cm above the pubic symphysis that area was marked the camera port. Bilateral robotic ports were marked 10 cm from the midline incision at approximately 5 angle. Under direct visualization each of the trochars was placed into the abdomen. The small bowel was folded on its mesentery to allow visualization to the pelvis. The 5 mm LUQ port was then converted to a 10/12 port under direct visualization.  After assuring adequate visualization, the robot was then docked in the usual fashion. Under direct visualization the robotic instruments replaced.   The round ligament on the patient's right side was transected with monopolar cautery. The anterior and posterior leaves of the broad ligament were then taken down in the usual fashion. The ureter was identified on the medial leaf of the broad ligament. A window was made between the IP and the ureter. The IP was coagulated with bipolar cautery and transected. The posterior leaf of the broad ligament was taken down to the level of the KOH ring. The bladder flap was created using meticulous dissection and pinpoint cautery. The uterine vessels were coagulated with bipolar cautery. The uterine vessels were then transected and the C loop was created. The same procedure  was performed on the patient's left side. The pneumo-occulder in the vagina was then insufflated. The colpotomy was then created in the usual fashion. The specimen was too large to fit through the vaginal colpotomy therefore it was placed in  the abdomen for later retrieval.  Our attention was then drawn to opening the paravesical space on her right side the perirectal space was also opened. The obturator nerve was identified. The nodal bundle extending over the external iliac artery down to the external iliac vein was taken down using sharp dissection and monopolar cautery. The genitofemoral nerve was identified and spared. We continued our dissection down to the level of the obturator nerve. The nodal bundle superior to the obturator nerve was taken. All pedicles were noted to be hemostatic the ureter was noted to be well medial of the area of dissection. The nodal bundle was then placed and an Endo catch bag. In a similar fashion on the left the pararectal and paravesical spaces were developed, the obturator and genitofemoral nerves were identified and spared and the ureter was mobilized medially. The lateralized nodal packet was removed en bloc and placed in an endocatch bag for retrieval. The nodes and raytecs were placed in the vagina and removed. The vaginal cuff was closed with a running 0 Vicryl on CT 1 suture. The abdomen and pelvis were copiously irrigated and noted to be hemostatic. The robotic instruments were removed under direct visualization as were the robotic trochars. The robotic was undocked.   A 5cm suprapubic transverse incision was made with the scalpel. The fascia was incised transversely with the bovie. The rectus was mobilized off of the rectus and the peritoneum was opened in the midline. The uterine specimen was retrieved manually from the pelvis. The fascia was closed with 0 vicryl and the suprapubic incision. The subcutaneous fat was infiltrated with long acting local anesthetic. The subcutaneous tissues were closed with 2-0 vicryl. The skin was closed with 4-0 vicryl.  Using of 0 Vicryl on a UR 6 needle the midline port fascia was closed after being grasped with allis clamps. The subcutaneous tissues of the port in  the left upper quadrant was reapproximated. The skin was closed using 4-0 Vicryl. Dermabond was applied. The pneumo occluded balloon was removed from the vagina. The vagina was swabbed and noted to be hemostatic.   All instrument needle and Ray-Tec counts were correct x2. The patient tolerated the procedure well and was taken to the recovery room in stable condition. This is Everitt Amber dictating an operative note on patient Shannon [name].

## 2013-09-29 ENCOUNTER — Encounter (HOSPITAL_COMMUNITY): Payer: Self-pay | Admitting: Gynecologic Oncology

## 2013-09-29 DIAGNOSIS — C549 Malignant neoplasm of corpus uteri, unspecified: Secondary | ICD-10-CM | POA: Diagnosis not present

## 2013-09-29 LAB — CBC
HCT: 27.5 % — ABNORMAL LOW (ref 36.0–46.0)
Hemoglobin: 8.7 g/dL — ABNORMAL LOW (ref 12.0–15.0)
MCH: 27.3 pg (ref 26.0–34.0)
MCHC: 31.6 g/dL (ref 30.0–36.0)
MCV: 86.2 fL (ref 78.0–100.0)
PLATELETS: 323 10*3/uL (ref 150–400)
RBC: 3.19 MIL/uL — ABNORMAL LOW (ref 3.87–5.11)
RDW: 13.1 % (ref 11.5–15.5)
WBC: 12.7 10*3/uL — AB (ref 4.0–10.5)

## 2013-09-29 LAB — BASIC METABOLIC PANEL
Anion gap: 10 (ref 5–15)
BUN: 6 mg/dL (ref 6–23)
CO2: 23 mEq/L (ref 19–32)
Calcium: 9.6 mg/dL (ref 8.4–10.5)
Chloride: 104 mEq/L (ref 96–112)
Creatinine, Ser: 0.65 mg/dL (ref 0.50–1.10)
Glucose, Bld: 140 mg/dL — ABNORMAL HIGH (ref 70–99)
POTASSIUM: 4.4 meq/L (ref 3.7–5.3)
SODIUM: 137 meq/L (ref 137–147)

## 2013-09-29 LAB — HEMOGLOBIN AND HEMATOCRIT, BLOOD
HCT: 27.9 % — ABNORMAL LOW (ref 36.0–46.0)
Hemoglobin: 9.2 g/dL — ABNORMAL LOW (ref 12.0–15.0)

## 2013-09-29 MED ORDER — OXYCODONE-ACETAMINOPHEN 5-325 MG PO TABS: 1.0000 | ORAL_TABLET | ORAL | Status: DC | PRN

## 2013-09-29 NOTE — Discharge Instructions (Signed)
09/29/2013  Return to work: 4-6 weeks if applicable  Activity: 1. Be up and out of the bed during the day.  Take a nap if needed.  You may walk up steps but be careful and use the hand rail.  Stair climbing will tire you more than you think, you may need to stop part way and rest.   2. No lifting or straining for 6 weeks.  3. No driving for 1 week.  Do not drive if you are taking narcotic pain medicine.  4. Shower daily.  Use soap and water on your incision and pat dry; don't rub.  No tub baths until cleared by your surgeon.   5. No sexual activity and nothing in the vagina for 8 weeks.  Diet: 1. Low sodium Heart Healthy Diet is recommended.  2. It is safe to use a laxative, such as Miralax or Colace, if you have difficulty moving your bowels.   Wound Care: 1. Keep clean and dry.  Shower daily.  Reasons to call the Doctor:  Fever - Oral temperature greater than 100.4 degrees Fahrenheit  Foul-smelling vaginal discharge  Difficulty urinating  Nausea and vomiting  Increased pain at the site of the incision that is unrelieved with pain medicine.  Difficulty breathing with or without chest pain  New calf pain especially if only on one side  Sudden, continuing increased vaginal bleeding with or without clots.   Contacts: For questions or concerns you should contact:  Dr. Lahoma Crocker at [phone]  Dr. Everitt Amber at [phone]  Joylene John, NP at [phone]  Acetaminophen; Oxycodone tablets What is this medicine? ACETAMINOPHEN; OXYCODONE (a set a MEE noe fen; ox i KOE done) is a pain reliever. It is used to treat mild to moderate pain. This medicine may be used for other purposes; ask your health care provider or pharmacist if you have questions. COMMON BRAND NAME(S): Endocet, Magnacet, Narvox, Percocet, Perloxx, Primalev, Primlev, Roxicet, Xolox What should I tell my health care provider before I take this medicine? They need to know if you have any of  these conditions: -brain tumor -Crohn's disease, inflammatory bowel disease, or ulcerative colitis -drug abuse or addiction -head injury -heart or circulation problems -if you often drink alcohol -kidney disease or problems going to the bathroom -liver disease -lung disease, asthma, or breathing problems -an unusual or allergic reaction to acetaminophen, oxycodone, other opioid analgesics, other medicines, foods, dyes, or preservatives -pregnant or trying to get pregnant -breast-feeding How should I use this medicine? Take this medicine by mouth with a full glass of water. Follow the directions on the prescription label. Take your medicine at regular intervals. Do not take your medicine more often than directed. Talk to your pediatrician regarding the use of this medicine in children. Special care may be needed. Patients over 54 years old may have a stronger reaction and need a smaller dose. Overdosage: If you think you have taken too much of this medicine contact a poison control center or emergency room at once. NOTE: This medicine is only for you. Do not share this medicine with others. What if I miss a dose? If you miss a dose, take it as soon as you can. If it is almost time for your next dose, take only that dose. Do not take double or extra doses. What may interact with this medicine? -alcohol -antihistamines -barbiturates like amobarbital, butalbital, butabarbital, methohexital, pentobarbital, phenobarbital, thiopental, and secobarbital -benztropine -drugs for bladder problems like solifenacin, trospium, oxybutynin, tolterodine, hyoscyamine, and  methscopolamine -drugs for breathing problems like ipratropium and tiotropium -drugs for certain stomach or intestine problems like propantheline, homatropine methylbromide, glycopyrrolate, atropine, belladonna, and dicyclomine -general anesthetics like etomidate, ketamine, nitrous oxide, propofol, desflurane, enflurane, halothane,  isoflurane, and sevoflurane -medicines for depression, anxiety, or psychotic disturbances -medicines for sleep -muscle relaxants -naltrexone -narcotic medicines (opiates) for pain -phenothiazines like perphenazine, thioridazine, chlorpromazine, mesoridazine, fluphenazine, prochlorperazine, promazine, and trifluoperazine -scopolamine -tramadol -trihexyphenidyl This list may not describe all possible interactions. Give your health care provider a list of all the medicines, herbs, non-prescription drugs, or dietary supplements you use. Also tell them if you smoke, drink alcohol, or use illegal drugs. Some items may interact with your medicine. What should I watch for while using this medicine? Tell your doctor or health care professional if your pain does not go away, if it gets worse, or if you have new or a different type of pain. You may develop tolerance to the medicine. Tolerance means that you will need a higher dose of the medication for pain relief. Tolerance is normal and is expected if you take this medicine for a long time. Do not suddenly stop taking your medicine because you may develop a severe reaction. Your body becomes used to the medicine. This does NOT mean you are addicted. Addiction is a behavior related to getting and using a drug for a non-medical reason. If you have pain, you have a medical reason to take pain medicine. Your doctor will tell you how much medicine to take. If your doctor wants you to stop the medicine, the dose will be slowly lowered over time to avoid any side effects. You may get drowsy or dizzy. Do not drive, use machinery, or do anything that needs mental alertness until you know how this medicine affects you. Do not stand or sit up quickly, especially if you are an older patient. This reduces the risk of dizzy or fainting spells. Alcohol may interfere with the effect of this medicine. Avoid alcoholic drinks. There are different types of narcotic medicines  (opiates) for pain. If you take more than one type at the same time, you may have more side effects. Give your health care provider a list of all medicines you use. Your doctor will tell you how much medicine to take. Do not take more medicine than directed. Call emergency for help if you have problems breathing. The medicine will cause constipation. Try to have a bowel movement at least every 2 to 3 days. If you do not have a bowel movement for 3 days, call your doctor or health care professional. Do not take Tylenol (acetaminophen) or medicines that have acetaminophen with this medicine. Too much acetaminophen can be very dangerous. Many nonprescription medicines contain acetaminophen. Always read the labels carefully to avoid taking more acetaminophen. What side effects may I notice from receiving this medicine? Side effects that you should report to your doctor or health care professional as soon as possible: -allergic reactions like skin rash, itching or hives, swelling of the face, lips, or tongue -breathing difficulties, wheezing -confusion -light headedness or fainting spells -severe stomach pain -unusually weak or tired -yellowing of the skin or the whites of the eyes Side effects that usually do not require medical attention (report to your doctor or health care professional if they continue or are bothersome): -dizziness -drowsiness -nausea -vomiting This list may not describe all possible side effects. Call your doctor for medical advice about side effects. You may report side effects to FDA at  1-800-FDA-1088. Where should I keep my medicine? Keep out of the reach of children. This medicine can be abused. Keep your medicine in a safe place to protect it from theft. Do not share this medicine with anyone. Selling or giving away this medicine is dangerous and against the law. Store at room temperature between 20 and 25 degrees C (68 and 77 degrees F). Keep container tightly closed.  Protect from light. This medicine may cause accidental overdose and death if it is taken by other adults, children, or pets. Flush any unused medicine down the toilet to reduce the chance of harm. Do not use the medicine after the expiration date. NOTE: This sheet is a summary. It may not cover all possible information. If you have questions about this medicine, talk to your doctor, pharmacist, or health care provider.  2015, [employer]. (2012-09-07 13:17:35)

## 2013-09-29 NOTE — Discharge Summary (Signed)
Physician Discharge Summary  Patient ID: Shannon [name] MRN: [id number] DOB/AGE: [birth year] 48 y.o.  Admit date: 09/28/2013 Discharge date: 09/29/2013  Admission Diagnoses: Endometrial cancer, grade I  Discharge Diagnoses:  Principal Problem:   Endometrial cancer, grade I Active Problems:   Endometrial cancer   Discharged Condition:  The patient is in good condition and stable for discharge.    Hospital Course: On 09/28/2013, the patient underwent the following: Procedure(s): ROBOTIC ASSISTED TOTAL HYSTERECTOMY WITH BILATERAL SALPINGO OOPHORECTOMY WITH  LYMPH NODE DISSECTION.  The postoperative course was uneventful.  She was discharged to home on postoperative day 1 tolerating a regular diet.  Consults: None  Significant Diagnostic Studies: None  Treatments: surgery: see above  Discharge Exam: Blood pressure 105/62, pulse 79, temperature 98.7 F (37.1 C), temperature source Oral, resp. rate 18, height 5\' 2"  (1.575 m), weight 283 lb (128.368 kg), last menstrual period 08/06/2013, SpO2 96.00%. General appearance: alert, cooperative and no distress Resp: clear to auscultation bilaterally Cardio: regular rate and rhythm, S1, S2 normal, no murmur, click, rub or gallop GI: soft, non-tender; bowel sounds normal; no masses,  no organomegaly and abdomen obese Extremities: extremities normal, atraumatic, no cyanosis or edema Incision/Wound: Lap sites with dermabond intact with no drainage or erythema  Disposition: Home      Discharge Instructions   Call MD for:  difficulty breathing, headache or visual disturbances    Complete by:  As directed      Call MD for:  extreme fatigue    Complete by:  As directed      Call MD for:  hives    Complete by:  As directed      Call MD for:  persistant dizziness or light-headedness    Complete by:  As directed      Call MD for:  persistant nausea and vomiting    Complete by:  As directed      Call MD for:  redness, tenderness, or signs of  infection (pain, swelling, redness, odor or green/yellow discharge around incision site)    Complete by:  As directed      Call MD for:  severe uncontrolled pain    Complete by:  As directed      Call MD for:  temperature >100.4    Complete by:  As directed      Diet - low sodium heart healthy    Complete by:  As directed      Driving Restrictions    Complete by:  As directed   No driving for 1 week.  Do not take narcotics and drive.     Increase activity slowly    Complete by:  As directed      Lifting restrictions    Complete by:  As directed   No lifting greater than 10 lbs.     Sexual Activity Restrictions    Complete by:  As directed   No sexual activity, nothing in the vagina, for 8 weeks.            Medication List    STOP taking these medications       norethindrone 5 MG tablet  Commonly known as:  AYGESTIN      TAKE these medications       oxyCODONE-acetaminophen 5-325 MG per tablet  Commonly known as:  PERCOCET/ROXICET  Take 1-2 tablets by mouth every 4 (four) hours as needed for severe pain (moderate to severe pain (when tolerating fluids)).       Follow-up  Information   Follow up with Donaciano Eva, MD On 11/11/2013. (at 12:00pm at the [facility] for follow up)    Specialty:  Obstetrics and Gynecology   Contact information:   [address] [place] [address] [phone]       Greater than thirty minutes were spend for face to face discharge instructions and discharge orders/summary in EPIC.   Signed: CROSS, MELISSA DEAL 09/29/2013, 9:04 AM

## 2013-09-29 NOTE — Progress Notes (Signed)
Nurse reviewed discharge instructions with pt and family.  Pt verbalized understanding of discharge instructions, new medications and follow up appointments.  No concerns at time of discharge.

## 2013-10-05 ENCOUNTER — Encounter: Payer: Self-pay | Admitting: Gynecologic Oncology

## 2013-10-05 ENCOUNTER — Telehealth: Payer: Self-pay | Admitting: Gynecologic Oncology

## 2013-10-05 NOTE — Progress Notes (Signed)
Patient presents to the office today with complaints of drainage from her left abdominal lap site after being startled out of bed over the weekend.  Reports having low grade temps at times with 100.5 being the highest.  Tolerating diet, bowels and bladder functioning, no vaginal bleeding.  Ecchymosis noted measuring 15 cm by 8 cm across the lower abdomen.  Dermabond removed from the left lap site with minimal amount of serous drainage noted.  No signs of infection at this time.  Incisional care discussed.  Low transverse incision intact with no drainage or erythema.  Dr. Denman George notified of situation.  Reportable signs and symptoms reviewed.  Patient to call the office for any questions or concerns.

## 2013-10-05 NOTE — Telephone Encounter (Signed)
Informed patient of her pathology findings (stage IA grade 1 endometrioid endometrial adenocarcinoma) no adjuvant therapy recommended for this low risk disease. 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 No adjuvant therapy recommended for this synchronous primary.

## 2013-10-05 NOTE — Telephone Encounter (Signed)
Returned phone call to the patient.  She reports becoming startled over the weekend and jumping out of bed.  Since that time, she has had serous drainage from her left abdomen incision.  No other symptoms voiced.  Having to change the bandage every two hours.  Advised to come to the office so the incision could be assessed.  Verbalizing understanding.

## 2013-10-06 ENCOUNTER — Telehealth: Payer: Self-pay | Admitting: Gynecologic Oncology

## 2013-10-06 NOTE — Telephone Encounter (Signed)
Called to check on patient's current status.  Reporting temp of 102 last pm.  Increasing abdominal pain and feeling full.  Denies vaginal bleeding, nausea, vomiting.  No change in bowel or bladder habits.  Drainage has decreased significantly and no drainage reported from the low transverse incision.

## 2013-10-08 ENCOUNTER — Encounter: Payer: Self-pay | Admitting: Gynecologic Oncology

## 2013-10-08 ENCOUNTER — Ambulatory Visit (HOSPITAL_BASED_OUTPATIENT_CLINIC_OR_DEPARTMENT_OTHER): Payer: BC Managed Care – PPO | Admitting: Gynecologic Oncology

## 2013-10-08 ENCOUNTER — Ambulatory Visit (HOSPITAL_COMMUNITY): Payer: BC Managed Care – PPO

## 2013-10-08 ENCOUNTER — Inpatient Hospital Stay (HOSPITAL_COMMUNITY): Payer: BC Managed Care – PPO

## 2013-10-08 ENCOUNTER — Ambulatory Visit: Payer: BC Managed Care – PPO

## 2013-10-08 ENCOUNTER — Other Ambulatory Visit: Payer: Self-pay | Admitting: *Deleted

## 2013-10-08 ENCOUNTER — Inpatient Hospital Stay (HOSPITAL_COMMUNITY)
Admission: AD | Admit: 2013-10-08 | Discharge: 2013-10-12 | DRG: 864 | Disposition: A | Discharge status: Home / Self Care | Payer: BC Managed Care – PPO | Source: Ambulatory Visit | Attending: Internal Medicine | Admitting: Internal Medicine

## 2013-10-08 ENCOUNTER — Encounter (HOSPITAL_COMMUNITY): Payer: Self-pay | Admitting: Internal Medicine

## 2013-10-08 VITALS — BP 142/82 | HR 113 | Temp 100.2°F | Resp 18 | Ht 62.0 in | Wt 284.4 lb

## 2013-10-08 DIAGNOSIS — I10 Essential (primary) hypertension: Secondary | ICD-10-CM | POA: Diagnosis present

## 2013-10-08 DIAGNOSIS — Z79899 Other long term (current) drug therapy: Secondary | ICD-10-CM | POA: Diagnosis not present

## 2013-10-08 DIAGNOSIS — C541 Malignant neoplasm of endometrium: Secondary | ICD-10-CM | POA: Diagnosis present

## 2013-10-08 DIAGNOSIS — C562 Malignant neoplasm of left ovary: Secondary | ICD-10-CM | POA: Diagnosis present

## 2013-10-08 DIAGNOSIS — C549 Malignant neoplasm of corpus uteri, unspecified: Secondary | ICD-10-CM | POA: Diagnosis present

## 2013-10-08 DIAGNOSIS — I498 Other specified cardiac arrhythmias: Secondary | ICD-10-CM | POA: Diagnosis present

## 2013-10-08 DIAGNOSIS — D509 Iron deficiency anemia, unspecified: Secondary | ICD-10-CM | POA: Diagnosis present

## 2013-10-08 DIAGNOSIS — Z6841 Body Mass Index (BMI) 40.0 and over, adult: Secondary | ICD-10-CM | POA: Diagnosis not present

## 2013-10-08 DIAGNOSIS — R5082 Postprocedural fever: Secondary | ICD-10-CM

## 2013-10-08 DIAGNOSIS — R0600 Dyspnea, unspecified: Secondary | ICD-10-CM | POA: Diagnosis present

## 2013-10-08 DIAGNOSIS — R9431 Abnormal electrocardiogram [ECG] [EKG]: Secondary | ICD-10-CM

## 2013-10-08 DIAGNOSIS — I119 Hypertensive heart disease without heart failure: Secondary | ICD-10-CM

## 2013-10-08 DIAGNOSIS — K5900 Constipation, unspecified: Secondary | ICD-10-CM | POA: Diagnosis present

## 2013-10-08 DIAGNOSIS — R6510 Systemic inflammatory response syndrome (SIRS) of non-infectious origin without acute organ dysfunction: Secondary | ICD-10-CM | POA: Diagnosis present

## 2013-10-08 DIAGNOSIS — D649 Anemia, unspecified: Secondary | ICD-10-CM

## 2013-10-08 DIAGNOSIS — R000 Tachycardia, unspecified: Secondary | ICD-10-CM | POA: Diagnosis present

## 2013-10-08 DIAGNOSIS — C569 Malignant neoplasm of unspecified ovary: Secondary | ICD-10-CM

## 2013-10-08 DIAGNOSIS — R509 Fever, unspecified: Secondary | ICD-10-CM | POA: Diagnosis present

## 2013-10-08 LAB — CBC WITH DIFFERENTIAL/PLATELET
BASO%: 1.1 % (ref 0.0–2.0)
BASOS PCT: 0 % (ref 0–1)
Basophils Absolute: 0.1 10*3/uL (ref 0.0–0.1)
Basophils Absolute: 0 10*3/uL (ref 0.0–0.1)
EOS ABS: 0.4 10*3/uL (ref 0.0–0.7)
EOS%: 5.5 % (ref 0.0–7.0)
Eosinophils Absolute: 0.5 10*3/uL (ref 0.0–0.5)
Eosinophils Relative: 5 % (ref 0–5)
HCT: 29.1 % — ABNORMAL LOW (ref 34.8–46.6)
HEMATOCRIT: 28.2 % — AB (ref 36.0–46.0)
HGB: 9.3 g/dL — ABNORMAL LOW (ref 11.6–15.9)
Hemoglobin: 9 g/dL — ABNORMAL LOW (ref 12.0–15.0)
LYMPH%: 13.4 % — AB (ref 14.0–49.7)
Lymphocytes Relative: 15 % (ref 12–46)
Lymphs Abs: 1.1 10*3/uL (ref 0.7–4.0)
MCH: 26.6 pg (ref 25.1–34.0)
MCH: 26.6 pg (ref 26.0–34.0)
MCHC: 31.8 g/dL (ref 31.5–36.0)
MCHC: 31.9 g/dL (ref 30.0–36.0)
MCV: 83.7 fL (ref 79.5–101.0)
MCV: 83.4 fL (ref 78.0–100.0)
MONO ABS: 0.7 10*3/uL (ref 0.1–1.0)
MONO#: 1 10*3/uL — AB (ref 0.1–0.9)
MONO%: 11.6 % (ref 0.0–14.0)
Monocytes Relative: 9 % (ref 3–12)
NEUT#: 6.1 10*3/uL (ref 1.5–6.5)
NEUT%: 68.4 % (ref 38.4–76.8)
NEUTROS ABS: 5.1 10*3/uL (ref 1.7–7.7)
NEUTROS PCT: 71 % (ref 43–77)
Platelets: 453 10*3/uL — ABNORMAL HIGH (ref 145–400)
Platelets: 400 10*3/uL (ref 150–400)
RBC: 3.48 10*6/uL — ABNORMAL LOW (ref 3.70–5.45)
RBC: 3.38 MIL/uL — AB (ref 3.87–5.11)
RDW: 13.6 % (ref 11.2–14.5)
RDW: 12.9 % (ref 11.5–15.5)
WBC: 8.9 10*3/uL (ref 3.9–10.3)
WBC: 7.3 10*3/uL (ref 4.0–10.5)
lymph#: 1.2 10*3/uL (ref 0.9–3.3)

## 2013-10-08 LAB — VITAMIN B12: Vitamin B-12: 276 pg/mL (ref 211–911)

## 2013-10-08 LAB — FERRITIN: FERRITIN: 30 ng/mL (ref 10–291)

## 2013-10-08 LAB — URINALYSIS, MICROSCOPIC - CHCC
BILIRUBIN (URINE): NEGATIVE
Blood: NEGATIVE
GLUCOSE UR CHCC: NEGATIVE mg/dL
KETONES: NEGATIVE mg/dL
LEUKOCYTE ESTERASE: NEGATIVE
Nitrite: NEGATIVE
Protein: <30 mg/dL
Specific Gravity, Urine: 1.03 (ref 1.003–1.035)
Urobilinogen, UR: 0.2 mg/dL (ref 0.2–1)
pH: 5 (ref 4.6–8.0)

## 2013-10-08 LAB — COMPREHENSIVE METABOLIC PANEL (CC13)
ALT: 24 U/L (ref 0–55)
AST: 26 U/L (ref 5–34)
Albumin: 3 g/dL — ABNORMAL LOW (ref 3.5–5.0)
Alkaline Phosphatase: 72 U/L (ref 40–150)
Anion Gap: 8 mEq/L (ref 3–11)
BUN: 8.6 mg/dL (ref 7.0–26.0)
CO2: 25 mEq/L (ref 22–29)
Calcium: 10 mg/dL (ref 8.4–10.4)
Chloride: 106 mEq/L (ref 98–109)
Creatinine: 0.7 mg/dL (ref 0.6–1.1)
Glucose: 99 mg/dl (ref 70–140)
Potassium: 4.2 mEq/L (ref 3.5–5.1)
Sodium: 139 mEq/L (ref 136–145)
Total Bilirubin: 0.46 mg/dL (ref 0.20–1.20)
Total Protein: 7.2 g/dL (ref 6.4–8.3)

## 2013-10-08 LAB — MAGNESIUM: Magnesium: 1.7 mg/dL (ref 1.5–2.5)

## 2013-10-08 LAB — CREATININE, SERUM
Creatinine, Ser: 0.58 mg/dL (ref 0.50–1.10)
GFR calc Af Amer: >90 mL/min (ref >90)
GFR calc non Af Amer: >90 mL/min (ref >90)

## 2013-10-08 LAB — RETICULOCYTES
RBC.: 3.31 MIL/uL — ABNORMAL LOW (ref 3.87–5.11)
Retic Count, Absolute: 72.8 10*3/uL (ref 19.0–186.0)
Retic Ct Pct: 2.2 % (ref 0.4–3.1)

## 2013-10-08 LAB — LACTIC ACID, PLASMA: Lactic Acid, Venous: 1 mmol/L (ref 0.5–2.2)

## 2013-10-08 LAB — FOLATE

## 2013-10-08 MED ORDER — DOCUSATE SODIUM 100 MG PO CAPS
100.0000 mg | ORAL_CAPSULE | Freq: Two times a day (BID) | ORAL | Status: DC
  Administered 2013-10-08 (×2): 100 mg via ORAL
  Filled 2013-10-08 (×10): qty 1

## 2013-10-08 MED ORDER — ACETAMINOPHEN 325 MG PO TABS
650.0000 mg | ORAL_TABLET | Freq: Four times a day (QID) | ORAL | Status: DC | PRN
  Administered 2013-10-08 – 2013-10-10 (×3): 650 mg via ORAL
  Filled 2013-10-08 (×3): qty 2

## 2013-10-08 MED ORDER — ENOXAPARIN SODIUM 60 MG/0.6ML ~~LOC~~ SOLN
60.0000 mg | SUBCUTANEOUS | Status: DC
  Administered 2013-10-08 – 2013-10-11 (×4): 60 mg via SUBCUTANEOUS
  Filled 2013-10-08 (×5): qty 0.6

## 2013-10-08 MED ORDER — PIPERACILLIN-TAZOBACTAM 3.375 G IVPB
3.3750 g | Freq: Three times a day (TID) | INTRAVENOUS | Status: DC
  Administered 2013-10-08 – 2013-10-11 (×11): 3.375 g via INTRAVENOUS
  Filled 2013-10-08 (×12): qty 50

## 2013-10-08 MED ORDER — OXYCODONE HCL 5 MG PO TABS: 5.0000 mg | ORAL_TABLET | ORAL | Status: DC | PRN

## 2013-10-08 MED ORDER — IOHEXOL 350 MG/ML SOLN
125.0000 mL | Freq: Once | INTRAVENOUS | Status: AC | PRN
  Administered 2013-10-08: 125 mL via INTRAVENOUS

## 2013-10-08 MED ORDER — ACETAMINOPHEN 650 MG RE SUPP: 650.0000 mg | Freq: Four times a day (QID) | RECTAL | Status: DC | PRN

## 2013-10-08 MED ORDER — ONDANSETRON HCL 4 MG PO TABS: 4.0000 mg | ORAL_TABLET | Freq: Four times a day (QID) | ORAL | Status: DC | PRN

## 2013-10-08 MED ORDER — DIPHENHYDRAMINE HCL 50 MG/ML IJ SOLN
25.0000 mg | Freq: Once | INTRAMUSCULAR | Status: AC
  Administered 2013-10-08: 25 mg via INTRAVENOUS
  Filled 2013-10-08: qty 1

## 2013-10-08 MED ORDER — OXYCODONE-ACETAMINOPHEN 5-325 MG PO TABS: 1.0000 | ORAL_TABLET | ORAL | Status: DC | PRN

## 2013-10-08 MED ORDER — MORPHINE SULFATE 2 MG/ML IJ SOLN: 1.0000 mg | INTRAMUSCULAR | Status: DC | PRN

## 2013-10-08 MED ORDER — VANCOMYCIN HCL 10 G IV SOLR
1250.0000 mg | Freq: Two times a day (BID) | INTRAVENOUS | Status: DC
  Administered 2013-10-09 – 2013-10-11 (×5): 1250 mg via INTRAVENOUS
  Filled 2013-10-08 (×6): qty 1250

## 2013-10-08 MED ORDER — MAGNESIUM CITRATE PO SOLN: 1.0000 | Freq: Once | ORAL | Status: AC | PRN

## 2013-10-08 MED ORDER — SODIUM CHLORIDE 0.9 % IV BOLUS (SEPSIS)
1000.0000 mL | Freq: Once | INTRAVENOUS | Status: AC
  Administered 2013-10-08: 1000 mL via INTRAVENOUS

## 2013-10-08 MED ORDER — BISACODYL 5 MG PO TBEC: 10.0000 mg | DELAYED_RELEASE_TABLET | Freq: Every day | ORAL | Status: DC | PRN

## 2013-10-08 MED ORDER — SODIUM CHLORIDE 0.9 % IV SOLN
INTRAVENOUS | Status: DC
  Administered 2013-10-08 – 2013-10-09 (×3): via INTRAVENOUS
  Administered 2013-10-09: 125 mL/h via INTRAVENOUS

## 2013-10-08 MED ORDER — SORBITOL 70 % SOLN
30.0000 mL | Freq: Every day | Status: DC | PRN
  Filled 2013-10-08: qty 30

## 2013-10-08 MED ORDER — VANCOMYCIN HCL 10 G IV SOLR
2500.0000 mg | Freq: Once | INTRAVENOUS | Status: AC
  Administered 2013-10-08: 2500 mg via INTRAVENOUS
  Filled 2013-10-08: qty 2500

## 2013-10-08 MED ORDER — IOHEXOL 300 MG/ML  SOLN
50.0000 mL | INTRAMUSCULAR | Status: AC
  Administered 2013-10-08 (×2): 50 mL via ORAL

## 2013-10-08 MED ORDER — POLYETHYLENE GLYCOL 3350 17 G PO PACK
17.0000 g | PACK | Freq: Every day | ORAL | Status: DC | PRN
  Filled 2013-10-08: qty 1

## 2013-10-08 MED ORDER — ONDANSETRON HCL 4 MG/2ML IJ SOLN: 4.0000 mg | Freq: Four times a day (QID) | INTRAMUSCULAR | Status: DC | PRN

## 2013-10-08 NOTE — H&P (Signed)
[facility] History and Physical  Shannon [name] QAS:[id number] DOB: 07-Apr-1965 DOA: 10/08/2013  Referring physician: Dr Everitt Amber PCP: Odette Fraction, MD   Chief Complaint: fever, abdominal pain  HPI: Shannon [name] is a 48 y.o. 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 Patient states that postoperatively on day of discharge she had a little bit of leakage in the left sided abdominal incision. Patient had followed up with the nurse practitioner incision was opened slightly with increased leakage which subsequently resolved. Patient also stated that noted her lower abdominal hematoma around the lower abdominal incision site placed a washcloth on it with some improvement. Patient endorses some constipation, dysuria, generalized weakness, flatus. Patient denies any diarrhea, no chest parenchyma no melena, no hematemesis, no hematochezia. Patient stated had last bowel movement one day prior to admission. Patient was seen on followup by GYN oncologist office and subsequently transferred to the hospital for further workup on the postop fever. Hospitalist group was called to admit the patient for further evaluation and management.   Review of Systems: As per history of present illness otherwise negative. Constitutional:  No weight loss, night sweats. HEENT:  No headaches, Difficulty swallowing,Tooth/dental problems,Sore throat,  No sneezing, itching, ear ache, nasal congestion, post nasal drip,  Cardio-vascular:    No chest pain, palpitations  GI:  No heartburn, indigestion, nausea, vomiting, diarrhea, change in bowel habits, loss of appetite  Resp:  No excess mucus, no productive cough, No non-productive cough, No coughing up of blood.No change in color of mucus.No wheezing.No chest wall deformity  Skin:  no rash or lesions.  GU:  no change in color of urine, no urgency or frequency. No flank pain.  Musculoskeletal:  No joint pain or swelling. No decreased range of motion. No back pain.  Psych:  No change in mood or affect. No depression or anxiety. No memory loss.   Past Medical History  Diagnosis Date  . PONV (postoperative nausea and vomiting)   . Hypertension     not taking med x1 yr. couldn't afford.  Marland Kitchen UTI (lower urinary tract infection)     history of 1 month ago  . Cancer     Uterine cancer dx. surgery planned TAH  . Uterine cancer   . Ovarian cancer    Past Surgical History  Procedure Laterality Date  . Cholecystectomy      laparoscopic  . Robotic assisted total hysterectomy with bilateral salpingo oopherectomy Bilateral 09/28/2013    Procedure: ROBOTIC ASSISTED TOTAL HYSTERECTOMY WITH BILATERAL SALPINGO OOPHORECTOMY WITH  LYMPH NODE DISECTION, ;  Surgeon: Everitt Amber, MD;  Location: [facility];  Service: Gynecology;  Laterality: Bilateral;   Social History:  reports that she has never smoked. She does not have any smokeless tobacco history on file. She reports that she does not drink alcohol or use illicit drugs.  Allergies  Allergen Reactions  . Tetanus Toxoids     Swelling at site, lethargic x few days    History reviewed. No pertinent family history.   Prior to Admission medications   Medication Sig Start Date End Date Taking? Authorizing Provider  acetaminophen (TYLENOL)  500 MG tablet Take 500 mg by mouth every 6 (six) hours as needed (For pain.).   Yes Historical Provider, MD  bisacodyl (DULCOLAX) 5 MG EC tablet Take 10 mg by mouth daily as needed for moderate  constipation.   Yes Historical Provider, MD  docusate sodium (STOOL SOFTENER) 100 MG capsule Take 100 mg by mouth daily as needed for mild constipation.   Yes Historical Provider, MD  oxyCODONE-acetaminophen (PERCOCET/ROXICET) 5-325 MG per tablet Take 1-2 tablets by mouth every 4 (four) hours as needed for moderate pain or severe pain (when taking fluids).   Yes Historical Provider, MD   Physical Exam: Filed Vitals:   10/08/13 1339 10/08/13 1450  BP: 132/87 148/83  Pulse: 121 124  Temp: 99.8 F (37.7 C) 97.6 F (36.4 C)  TempSrc: Oral Oral  Resp: 19 18  Height: 5\' 2"  (1.575 m)   Weight: 128.822 kg (284 lb)   SpO2: 97% 98%    Wt Readings from Last 3 Encounters:  10/08/13 128.822 kg (284 lb)  10/08/13 129.003 kg (284 lb 6.4 oz)  09/28/13 128.368 kg (283 lb)    General:  Obese female in no acute cardiopulmonary distress. Speaking in full sentences.  Eyes: PERRLA, EOMI, normal lids, irises & conjunctiva ENT: grossly normal hearing, lips & tongue Neck: no LAD, masses or thyromegaly Cardiovascular: Tachycardic, no m/r/g. No LE edema.  Respiratory: CTA bilaterally, no w/r/r. Normal respiratory effort. Abdomen: soft, nondistended, positive bowel sounds, slight abdominal discomfort/pain in the mid abdominal region. Incision sites look clean dry and intact. Suprapubic incision site with no cellulitis or drainage. Skin: no rash or induration seen on limited exam Musculoskeletal: grossly normal tone BUE/BLE Psychiatric: grossly normal mood and affect, speech fluent and appropriate Neurologic: Alert and oriented x3. Cranial nerves II through XII are grossly intact. Sensation is intact. Visual fields are intact. No focal abnormalities.           Labs on Admission:  Basic Metabolic Panel:  Recent Labs Lab 10/08/13 1158  NA 139  K 4.2  CO2 25  GLUCOSE 99  BUN 8.6  CREATININE 0.7  CALCIUM 10.0   Liver Function Tests:  Recent Labs Lab 10/08/13 1158  AST 26  ALT 24  ALKPHOS  72  BILITOT 0.46  PROT 7.2  ALBUMIN 3.0*   No results found for this basename: LIPASE, AMYLASE,  in the last 168 hours No results found for this basename: AMMONIA,  in the last 168 hours CBC:  Recent Labs Lab 10/08/13 1158 10/08/13 1420  WBC 8.9 7.3  NEUTROABS 6.1 5.1  HGB 9.3* 9.0*  HCT 29.1* 28.2*  MCV 83.7 83.4  PLT 453* 400   Cardiac Enzymes: No results found for this basename: CKTOTAL, CKMB, CKMBINDEX, TROPONINI,  in the last 168 hours  BNP (last 3 results) No results found for this basename: PROBNP,  in the last 8760 hours CBG: No results found for this basename: GLUCAP,  in the last 168 hours  Radiological Exams on Admission: No results found.  EKG: None  Assessment/Plan Principal Problem:   SIRS (systemic inflammatory response syndrome) Active Problems:   Postoperative fever   Endometrial cancer, grade I   Morbid obesity   Ovarian cancer on left   Anemia   Tachycardia   Dyspnea  #1 systemic inflammatory response syndrome Patient is presenting with fever and chills abdominal pain postoperatively noted to have a temperature 100.2 with a pulse rate of 124. CBC which was done a GYN oncologist office with a normal  white count. Will check blood cultures x2. Urinalysis which was done was unremarkable. Will check a lactic acid level. Check a CT of the abdomen and pelvis to rule out any intra-abdominal abnormalities/abscesses. We'll place empirically on IV vancomycin, IV Zosyn. Follow.  #2 postoperative fever/tachycardia/dyspnea Questionable etiology. May be secondary to problem #1. PE is also a consideration in the differential as patient with recent surgery. Patient will be pancultured as noted in problem #1. Will check a CT of the chest, abdomen and pelvis to rule out PE and any intra-abdominal abnormalities of process. Check EKG. Place on IV fluids. Empiric IV antibiotics of vancomycin and Zosyn.  #3 anemia Likely secondary to postop blood loss with recent  surgery. Patient with no overt GI bleed. Check an anemia panel. Follow H&H. Transfusion threshold hemoglobin less than 7.  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 GYN/ONC, Dr. Denman George will follow the patient during the hospitalization.  #5 morbid obesity.  #6 prophylaxis Lovenox for DVT prophylaxis.  Code Status: Full DVT Prophylaxis: Lovenox Family Communication: Updated patient and sister at bedside Disposition Plan: Admit to MedSurg  Time spent: 49 mins  [facility] MD [facility] Pager [phone]  **Disclaimer: This note may have been dictated with voice recognition software. Similar sounding words can inadvertently be transcribed and this note may contain transcription errors which may not have been corrected upon publication of note.**

## 2013-10-08 NOTE — Progress Notes (Signed)
ANTIBIOTIC CONSULT NOTE - INITIAL  Pharmacy Consult for vancomycin, Zosyn Indication: rule out sepsis  Allergies  Allergen Reactions  . Tetanus Toxoids     Swelling at site, lethargic x few days    Patient Measurements: Height: 5\' 2"  (157.5 cm) (Documented on chart 10/08/13) Weight: 284 lb (128.822 kg) (Documented on chart) IBW/kg (Calculated) : 50.1  Vital Signs: Temp: 97.6 F (36.4 C) (09/11 1450) Temp src: Oral (09/11 1450) BP: 148/83 mmHg (09/11 1450) Pulse Rate: 124 (09/11 1450) Intake/Output from previous day:   Intake/Output from this shift:    Labs:  Recent Labs  10/08/13 1158 10/08/13 1158 10/08/13 1420  WBC 8.9  --  7.3  HGB 9.3*  --  9.0*  PLT 453*  --  400  CREATININE  --  0.7 0.58   Estimated Creatinine Clearance: 114.4 ml/min (by C-G formula based on Cr of 0.58). No results found for this basename: VANCOTROUGH, VANCOPEAK, VANCORANDOM, GENTTROUGH, GENTPEAK, GENTRANDOM, TOBRATROUGH, TOBRAPEAK, TOBRARND, AMIKACINPEAK, AMIKACINTROU, AMIKACIN,  in the last 72 hours   Microbiology: No results found for this or any previous visit (from the past 720 hour(s)).  Medical History: Past Medical History  Diagnosis Date  . PONV (postoperative nausea and vomiting)   . Hypertension     not taking med x1 yr. couldn't afford.  Marland Kitchen UTI (lower urinary tract infection)     history of 1 month ago  . Cancer     Uterine cancer dx. surgery planned TAH  . Uterine cancer   . Ovarian cancer     Medications:  Scheduled:  . docusate sodium  100 mg Oral BID  . enoxaparin (LOVENOX) injection  60 mg Subcutaneous Q24H  . iohexol  50 mL Oral Q1 Hr x 2   Infusions:  . sodium chloride 100 mL/hr at 10/08/13 1446   Assessment: 48 yo female s/p robotic hysterectomy, BSO and lymphadenectomy 9/1 found to have stage 1A Grade 1 endometrioid endometrial cancer presents to ER today with fevers past 48-72 hr of 100.2-102. To start vancomycin and Zosyn per pharmacy dosing for rule out  sepsis. Md also concerned for intraperitoneal process to do CT  9/11 >> vancomycin >> 9/11 >> Zosyn >>   Tmax 102 at home  WBC WNL  SCr 0.7, CrCl N > 100  9/11 Blood: 9/11 Urine:  Goal of Therapy:  Vancomycin trough level 15-20 mcg/ml  Plan:  1) Vancomycin 2500mg  IV x 1 then 1250mg  IV q12 2) Zosyn 3.375g IV q8 (extended interval infusion)   Adrian Saran, PharmD, BCPS Pager [phone] 10/08/2013 3:01 PM

## 2013-10-08 NOTE — Progress Notes (Signed)
POSTOPERATIVE EVALUTION  CC: fevers   HPI:  Shannon [name] is a 48 y.o. year old. initially seen in consultation on 09/02/13 for grade 1 endometrial cancer.  She then underwent a robotic hysterectomy, BSO and lymphadenectomy on 07/07/46 without complications.  Her postoperative course was uncomplicated and she was discharged on postoperative day 1.  Her final pathologic diagnosis is a Stage IA Grade 1 endometrioid endometrial cancer with no lymphovascular space invasion, no myometrial invasion and negative lymph nodes. 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  She is seen today for a postoperative check and to discuss her pathology results and treatment plan.  Since discharge from the hospital, she is feeling progressively worse. She has been having intermittent fevers (100.2-102) for the past 48-72 hours. She feels general malaise and abdominal pain. She is passing flatus and having bowel movements. She has some suprapubic discomfort not dissimilar to a UTI. She has only minimal serous drainage from the right mid abdominal port site.  Allergies  Allergen Reactions  . Tetanus Toxoids     Swelling at site, lethargic x few days   Outpatient Encounter Prescriptions as of 10/08/2013  Medication Sig  . oxyCODONE-acetaminophen (PERCOCET/ROXICET) 5-325 MG per tablet Take 1-2 tablets by mouth every 4 (four) hours as needed for severe pain (moderate to severe pain (when tolerating fluids)).    Past Medical History  Diagnosis Date  . PONV (postoperative nausea and vomiting)   . Hypertension     not taking med x1 yr. couldn't afford.  Marland Kitchen UTI (lower urinary tract infection)     history of 1 month ago  . Cancer     Uterine cancer dx. surgery planned TAH  . Uterine cancer   . Ovarian cancer    Past Surgical History  Procedure Laterality Date  . Cholecystectomy       laparoscopic  . Robotic assisted total hysterectomy with bilateral salpingo oopherectomy Bilateral 09/28/2013    Procedure: ROBOTIC ASSISTED TOTAL HYSTERECTOMY WITH BILATERAL SALPINGO OOPHORECTOMY WITH  LYMPH NODE DISECTION, ;  Surgeon: Everitt Amber, MD;  Location: [facility];  Service: Gynecology;  Laterality: Bilateral;  History reviewed. No pertinent family history.  History   Social History  . Marital Status: Single    Spouse Name: N/A    Number of Children: N/A  . Years of Education: N/A   Social History Main Topics  . Smoking status: Never Smoker   . Smokeless tobacco: None  . Alcohol Use: No  . Drug Use: No  . Sexual Activity: Not Currently   Other Topics Concern  . None   Social History Narrative  . None    Review of systems: Constitutional:  She has no weight gain or weight loss. +++ fever or chills. General malaise. Eyes: No blurred vision Ears, Nose, Mouth, Throat: No dizziness, headaches or changes in hearing. No mouth sores. Cardiovascular: No chest pain, palpitations or edema. Respiratory:  No shortness of breath, wheezing or cough Gastrointestinal: She has normal bowel movements without diarrhea or constipation. She denies any nausea or vomiting. She denies blood in her stool or heart burn. Genitourinary:  + pelvic pain and pelvic pressure. She has no hematuria, but does have dysuria, though no incontinence. She has no irregular vaginal bleeding or vaginal discharge Musculoskeletal: Denies muscle weakness or joint pains.  Skin:  She has no skin changes, rashes or itching Neurological:  Denies dizziness  or headaches. No neuropathy, no numbness or tingling. Psychiatric:  She denies depression or anxiety. Hematologic/Lymphatic:   No easy bruising or bleeding   Physical Exam: Blood pressure 142/82, pulse 113, temperature 100.2 F (37.9 C), temperature source Oral, resp. rate 18, height 5\' 2"  (1.575 m), weight 284 lb 6.4 oz (129.003 kg), last menstrual period  08/06/2013. General: Well dressed, well nourished in no apparent distress.   HEENT:  Normocephalic and atraumatic, no lesions.  Extraocular muscles intact. Sclerae anicteric. Pupils equal, round, reactive. No mouth sores or ulcers. Thyroid is normal size, not nodular, midline. Skin:  No lesions or rashes. Breasts:  Soft, symmetric.  No skin or nipple changes.  No palpable LN or masses. Lungs:  Clear to auscultation bilaterally.  No wheezes. Cardiovascular:  Regular rate and rhythm.  No murmurs or rubs. Mildly tachycardic Abdomen:  Soft, generally tender, nondistended but obese.  No palpable masses.  No hepatosplenomegaly.  No ascites. Normal bowel sounds.  No hernias.  Incisions are intact, not draining, healing normally with no signs of cellulitis. The suprapubic incision is also healing well with no cellulitis or drainage. There is some superior echymosis that is fading. Genitourinary: Normal EGBUS  Vaginal cuff intact.  No bleeding or discharge, no visible cuff cellulitis.  + fullness in cul de sac and tenderness on bimanual exam. Extremities: No cyanosis, clubbing or edema.  No calf tenderness or erythema. No palpable cords. Psychiatric: Mood and affect are appropriate. Neurological: Awake, alert and oriented x 3. Sensation is intact, no neuropathy.  Musculoskeletal: No pain, normal strength and range of motion.  Assessment:    48 y.o. 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   S/p robotic hysterectomy, BSO and lymphadenectomy on 09/28/13. no LVSI, no myometrial invasion, negative pelvic washings and negative lymph nodes. Ovarian cancer was low grade, with no capsular involvement.   Plan: 1) Pathology reports reviewed today 2) Treatment counseling - Very low risk (<5%) for recurrence given age, grade, depth of myometrial invasion and LVSI status. Multidisciplinary tumor board recommendation is for routine  surveillance with frequent pelvic exams and visits.  We will start with visits every 3 months x 2 years, then every 6 months for 3 more years, at which time she can return to annual visits.  Discussed signs and symptoms of recurrence including vaginal bleeding or discharge, leg pain or swelling and changes in bowel or bladder habits. She was given the opportunity to ask questions, which were answered to her satisfaction, and she is agreement with the above mentioned plan of care.  3)  Postoperative fever of unknown origin: I am concerned for an intraperitoneal process given her vaginal cuff tenderness, fever, and abdominal tenderness. I am recommending admission to the hospital for CT of the chest, abdomen and pelvis with contrast to evaluate for possible abscess, enteric leak or ureteral leak. I have ordered a CBC with differential and a CMP to evaluate lab status and signs of infection. I have ordered a UA and culture. I recommend blood cultures prior to starting broad spectrum antibiotics (eg Zosyn). I have communicated with Dr Darrick Meigs regarding an admission to hospital today. I will actively follow for management of her postoperative issues.  Donaciano Eva, MD (cell) [phone]

## 2013-10-09 DIAGNOSIS — Z90710 Acquired absence of both cervix and uterus: Secondary | ICD-10-CM

## 2013-10-09 DIAGNOSIS — R188 Other ascites: Secondary | ICD-10-CM

## 2013-10-09 DIAGNOSIS — C549 Malignant neoplasm of corpus uteri, unspecified: Secondary | ICD-10-CM

## 2013-10-09 DIAGNOSIS — C569 Malignant neoplasm of unspecified ovary: Secondary | ICD-10-CM

## 2013-10-09 DIAGNOSIS — D509 Iron deficiency anemia, unspecified: Secondary | ICD-10-CM

## 2013-10-09 DIAGNOSIS — J9819 Other pulmonary collapse: Secondary | ICD-10-CM

## 2013-10-09 DIAGNOSIS — I119 Hypertensive heart disease without heart failure: Secondary | ICD-10-CM

## 2013-10-09 DIAGNOSIS — J90 Pleural effusion, not elsewhere classified: Secondary | ICD-10-CM

## 2013-10-09 DIAGNOSIS — R0602 Shortness of breath: Secondary | ICD-10-CM

## 2013-10-09 DIAGNOSIS — I517 Cardiomegaly: Secondary | ICD-10-CM

## 2013-10-09 LAB — BASIC METABOLIC PANEL
Anion gap: 13 (ref 5–15)
BUN: 6 mg/dL (ref 6–23)
CALCIUM: 9.3 mg/dL (ref 8.4–10.5)
CHLORIDE: 104 meq/L (ref 96–112)
CO2: 23 meq/L (ref 19–32)
CREATININE: 0.65 mg/dL (ref 0.50–1.10)
GFR calc Af Amer: >90 mL/min (ref >90)
GFR calc non Af Amer: >90 mL/min (ref >90)
GLUCOSE: 103 mg/dL — AB (ref 70–99)
Potassium: 4 mEq/L (ref 3.7–5.3)
Sodium: 140 mEq/L (ref 137–147)

## 2013-10-09 LAB — TROPONIN I
Troponin I: <0.3 ng/mL (ref <0.30)
Troponin I: <0.3 ng/mL (ref <0.30)

## 2013-10-09 LAB — CBC
HEMATOCRIT: 25.6 % — AB (ref 36.0–46.0)
Hemoglobin: 8 g/dL — ABNORMAL LOW (ref 12.0–15.0)
MCH: 26.2 pg (ref 26.0–34.0)
MCHC: 31.3 g/dL (ref 30.0–36.0)
MCV: 83.9 fL (ref 78.0–100.0)
Platelets: 349 10*3/uL (ref 150–400)
RBC: 3.05 MIL/uL — ABNORMAL LOW (ref 3.87–5.11)
RDW: 12.9 % (ref 11.5–15.5)
WBC: 7.3 10*3/uL (ref 4.0–10.5)

## 2013-10-09 LAB — IRON AND TIBC
IRON: 10 ug/dL — AB (ref 42–135)
SATURATION RATIOS: 3 % — AB (ref 20–55)
TIBC: 303 ug/dL (ref 250–470)
UIBC: 293 ug/dL (ref 125–400)

## 2013-10-09 LAB — URINE CULTURE

## 2013-10-09 LAB — TSH: TSH: 2.65 u[IU]/mL (ref 0.350–4.500)

## 2013-10-09 MED ORDER — FERUMOXYTOL INJECTION 510 MG/17 ML
1020.0000 mg | Freq: Once | INTRAVENOUS | Status: AC
  Administered 2013-10-09: 1020 mg via INTRAVENOUS
  Filled 2013-10-09: qty 34

## 2013-10-09 MED ORDER — FERROUS SULFATE 325 (65 FE) MG PO TABS
325.0000 mg | ORAL_TABLET | Freq: Three times a day (TID) | ORAL | Status: DC
  Administered 2013-10-09 – 2013-10-12 (×10): 325 mg via ORAL
  Filled 2013-10-09 (×11): qty 1

## 2013-10-09 MED ORDER — CARVEDILOL 3.125 MG PO TABS
3.1250 mg | ORAL_TABLET | Freq: Two times a day (BID) | ORAL | Status: DC
  Administered 2013-10-09 – 2013-10-10 (×3): 3.125 mg via ORAL
  Filled 2013-10-09 (×6): qty 1

## 2013-10-09 MED ORDER — MAGNESIUM SULFATE 40 MG/ML IJ SOLN
2.0000 g | Freq: Once | INTRAMUSCULAR | Status: AC
  Administered 2013-10-09: 2 g via INTRAVENOUS
  Filled 2013-10-09: qty 50

## 2013-10-09 MED ORDER — ACETAMINOPHEN 325 MG PO TABS
650.0000 mg | ORAL_TABLET | Freq: Once | ORAL | Status: AC
Start: 2013-10-09 — End: 2013-10-09
  Administered 2013-10-09: 650 mg via ORAL
  Filled 2013-10-09: qty 2

## 2013-10-09 MED ORDER — DIPHENHYDRAMINE HCL 25 MG PO CAPS
25.0000 mg | ORAL_CAPSULE | Freq: Once | ORAL | Status: AC
Start: 2013-10-09 — End: 2013-10-09
  Administered 2013-10-09: 25 mg via ORAL
  Filled 2013-10-09: qty 1

## 2013-10-09 NOTE — Progress Notes (Signed)
Rt gave pt flutter valve. Pt knows and understands how to use. 

## 2013-10-09 NOTE — Progress Notes (Signed)
  Echocardiogram 2D Echocardiogram has been performed.  Shannon [name] 10/09/2013, 3:51 PM

## 2013-10-09 NOTE — Progress Notes (Signed)
Consultation Inpatient  CC: postop fevers   Subjective: Shannon [name] feels overall a little better today. She is still having chills and sweats. She had a tmax of 103 last night (9/11) at 9pm. She is passing flatus and BM's. Voiding without issues. Overall little pain. Mostly feelings of abdominal fullness and general malaise.  HPI:  Shannon [name] is a 48 y.o. year old. initially seen in consultation on 09/02/13 for grade 1 endometrial cancer.  She then underwent a robotic hysterectomy, BSO and lymphadenectomy on 07/05/18 without complications.  Her postoperative course was uncomplicated and she was discharged on postoperative day 1.  Her final pathologic diagnosis is a Stage IA Grade 1 endometrioid endometrial cancer with no lymphovascular space invasion, no myometrial invasion and negative lymph nodes. 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  She was seen on 10/08/13 for a postoperative check and to discuss her pathology results and treatment plan. She reported feeling progressively worse. She has been having intermittent fevers (100.2-102) for the previous 48-72 hours. She feels general malaise and abdominal pain. She is passing flatus and having bowel movements. She has some suprapubic discomfort not dissimilar to a UTI. She has only minimal serous drainage from the right mid abdominal port site.  She was admitted to the hospital with SIRS and work up for postoperative fevers. Labs were unremarkable (including mild, appropriate postop anemia, and a normal WBC with no left shift). UA was unremarkable Blood cultures were drawn.  CT of the chest, abdo and pelvis on 10/08/13 revealed:  1. No evidence of pulmonary embolism.  2. Moderate patchy areas of atelectasis in both lungs.  3. Trace right pleural effusion and possible trace pericardial  effusion.  4. Mild  cardiomegaly.  5. No evidence of intra-abdominal or intrapelvic abscess.  6. Small amount of abdominal free fluid and moderate amount pelvic  free fluid.  7. Status post hysterectomy. No adnexal mass or lymphadenopathy  The free fluid seen on scan is almost certainly lymphatic fluid s/p lymphadenectomy.  She was empirically started on vanc and zosyn (day was was 10/08/13)  Allergies  Allergen Reactions  . Contrast Media [Iodinated Diagnostic Agents] Anaphylaxis    Swelling of the throat, difficulty breathing  . Tetanus Toxoids     Swelling at site, lethargic x few days   Outpatient Encounter Prescriptions as of 10/08/2013  Medication Sig  . oxyCODONE-acetaminophen (PERCOCET/ROXICET) 5-325 MG per tablet Take 1-2 tablets by mouth every 4 (four) hours as needed for severe pain (moderate to severe pain (when tolerating fluids)).    Past Medical History  Diagnosis Date  . PONV (postoperative nausea and vomiting)   . Hypertension     not taking med x1 yr. couldn't afford.  Marland Kitchen UTI (lower urinary tract infection)     history of 1 month ago  . Cancer     Uterine cancer dx. surgery planned TAH  . Uterine cancer   . Ovarian cancer    Past Surgical History  Procedure Laterality Date  . Cholecystectomy      laparoscopic  . Robotic assisted total hysterectomy with bilateral salpingo oopherectomy Bilateral 09/28/2013    Procedure: ROBOTIC ASSISTED TOTAL HYSTERECTOMY WITH BILATERAL SALPINGO OOPHORECTOMY WITH  LYMPH NODE DISECTION, ;  Surgeon: Everitt Amber, MD;  Location: [facility];  Service: Gynecology;  Laterality: Bilateral;  History reviewed. No pertinent family history.  History   Social History  .  Marital Status: Single    Spouse Name: N/A    Number of Children: N/A  . Years of Education: N/A   Social History Main Topics  . Smoking status: Never Smoker   . Smokeless tobacco: None  . Alcohol Use: No  . Drug Use: No  . Sexual Activity: Not Currently   Other Topics Concern  . None    Social History Narrative  . None    Review of systems: Constitutional:  She has no weight gain or weight loss. +++ fever or chills. General malaise. Eyes: No blurred vision Ears, Nose, Mouth, Throat: No dizziness, headaches or changes in hearing. No mouth sores. Cardiovascular: No chest pain, palpitations or edema. Respiratory:  No shortness of breath, wheezing or cough Gastrointestinal: She has normal bowel movements without diarrhea or constipation. She denies any nausea or vomiting. She denies blood in her stool or heart burn. Genitourinary:  + pelvic pain and pelvic pressure. She has no hematuria, but does have dysuria, though no incontinence. She has no irregular vaginal bleeding or vaginal discharge Musculoskeletal: Denies muscle weakness or joint pains.  Skin:  She has no skin changes, rashes or itching Neurological:  Denies dizziness or headaches. No neuropathy, no numbness or tingling. Psychiatric:  She denies depression or anxiety. Hematologic/Lymphatic:   No easy bruising or bleeding   Physical Exam: Blood pressure 119/69, pulse 118, temperature 100.4 F (38 C), temperature source Oral, resp. rate 20, height 5\' 2"  (1.575 m), weight 284 lb 13.4 oz (129.2 kg), last menstrual period 08/06/2013, SpO2 95.00%. T max 103  At 9pm 10/08/13 General: Well dressed, well nourished in no apparent distress.   HEENT:  Normocephalic and atraumatic, no lesions.  Extraocular muscles intact. Sclerae anicteric. Pupils equal, round, reactive. No mouth sores or ulcers. Thyroid is normal size, not nodular, midline. Skin:  No lesions or rashes. Breasts:  Soft, symmetric.  No skin or nipple changes.  No palpable LN or masses. Lungs:  Clear to auscultation bilaterally.  No wheezes. Cardiovascular:  Regular rate and rhythm.  No murmurs or rubs. Mildly tachycardic Abdomen:  Soft, generally tender, nondistended but obese.  No palpable masses.  No hepatosplenomegaly.  No ascites. Normal bowel sounds.  No  hernias.  Incisions are intact, not draining, healing normally with no signs of cellulitis. The suprapubic incision is also healing well with no cellulitis or drainage. There is some superior echymosis that is fading. Genitourinary: Normal EGBUS  Vaginal cuff intact.  No bleeding or discharge, no visible cuff cellulitis.  + fullness in cul de sac and tenderness on bimanual exam. Extremities: No cyanosis, clubbing or edema.  No calf tenderness or erythema. No palpable cords. Psychiatric: Mood and affect are appropriate. Neurological: Awake, alert and oriented x 3. Sensation is intact, no neuropathy.  Musculoskeletal: No pain, normal strength and range of motion.  Assessment:    48 y.o. 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   S/p robotic hysterectomy, BSO and lymphadenectomy on 09/28/13. Postoperative fevers of unclear origin.   Plan: Recommend continuing broad spectrum antibiotics until 48 hours afebrile. Appropriate to continue regular diet. Recommend DVT prophylaxis with SCD's and Lovenox.  Donaciano Eva, MD (cell) [phone]

## 2013-10-09 NOTE — Consult Note (Signed)
Reason for Consult: Abnormal ECG  Requesting Physician: Thompson  Cardiologist: None  HPI: This is a 48 y.o.obese female with a past medical history significant for inconsistently treated HTN, now admitted for fever and anemia following hysterectomy. Her ECG shows sinus tachycardia and prominent repolarization abnormalities. She does not endorse any cardiac complaints now or in the past, other than fatigue attributable to anemia.  She has downsloping ST depression and inverted T waves in V3-V6 and in the inferior leads. The pattern is quite suggestive of LVH, but the voltage for LVH is not seen. It may be masked by obesity. A previous ECG from 8/27 shows an identical pattern, but the magnitude of ST depression was much lower.  Her echo today shows normal regional LV wall motion and overall LVEF. There is LVH and LA enlargement, in a pattern consistent with hypertensive heart disease.  PMHx:  Past Medical History  Diagnosis Date  . PONV (postoperative nausea and vomiting)   . Hypertension     not taking med x1 yr. couldn't afford.  Marland Kitchen UTI (lower urinary tract infection)     history of 1 month ago  . Cancer     Uterine cancer dx. surgery planned TAH  . Uterine cancer   . Ovarian cancer    Past Surgical History  Procedure Laterality Date  . Cholecystectomy      laparoscopic  . Robotic assisted total hysterectomy with bilateral salpingo oopherectomy Bilateral 09/28/2013    Procedure: ROBOTIC ASSISTED TOTAL HYSTERECTOMY WITH BILATERAL SALPINGO OOPHORECTOMY WITH  LYMPH NODE DISECTION, ;  Surgeon: Everitt Amber, MD;  Location: [facility];  Service: Gynecology;  Laterality: Bilateral;    FAMHx: History reviewed. No pertinent family history.  SOCHx:  reports that she has never smoked. She does not have any smokeless tobacco history on file. She reports that she does not drink alcohol or use illicit drugs.  ALLERGIES: Allergies  Allergen Reactions  . Contrast Media [Iodinated  Diagnostic Agents] Anaphylaxis    Swelling of the throat, difficulty breathing  . Tetanus Toxoids     Swelling at site, lethargic x few days    ROS: Constitutional:  No weight loss, night sweats.  HEENT:  No headaches, Difficulty swallowing,Tooth/dental problems,Sore throat,  No sneezing, itching, ear ache, nasal congestion, post nasal drip,  Cardio-vascular:  No chest pain, palpitations, no dyspnea at rest or with exertion, no edema GI:  No heartburn, indigestion, nausea, vomiting, diarrhea, change in bowel habits, loss of appetite  Resp:  No excess mucus, no productive cough, No non-productive cough, No coughing up of blood.No change in color of mucus.No wheezing.No chest wall deformity  Skin:  no rash or lesions.  GU:  no change in color of urine, no urgency or frequency. No flank pain.  Musculoskeletal:  No joint pain or swelling. No decreased range of motion. No back pain.  Psych:  No change in mood or affect. No depression or anxiety. No memory loss.     HOME MEDICATIONS: Prescriptions prior to admission  Medication Sig Dispense Refill  . acetaminophen (TYLENOL) 500 MG tablet Take 500 mg by mouth every 6 (six) hours as needed (For pain.).      Marland Kitchen bisacodyl (DULCOLAX) 5 MG EC tablet Take 10 mg by mouth daily as needed for moderate constipation.      . docusate sodium (STOOL SOFTENER) 100 MG capsule Take 100 mg by mouth daily as needed for mild constipation.      Marland Kitchen oxyCODONE-acetaminophen (PERCOCET/ROXICET) 5-325 MG  per tablet Take 1-2 tablets by mouth every 4 (four) hours as needed for moderate pain or severe pain (when taking fluids).         VITALS: Blood pressure 115/70, pulse 117, temperature 100.5 F (38.1 C), temperature source Oral, resp. rate 20, height 5\' 2"  (1.575 m), weight 284 lb 13.4 oz (129.2 kg), last menstrual period 08/06/2013, SpO2 97.00%.  PHYSICAL EXAM:  General: Alert, oriented x3, no distress Head: no evidence of trauma, PERRL, EOMI, no  exophtalmos or lid lag, no myxedema, no xanthelasma; normal ears, nose and oropharynx Neck: normalar venous pulsations and no hepatojugular reflux; brisk carotid pulses without delay and no carotid bruits Chest: clear to auscultation, no signs of consolidation by percussion or palpation, normal fremitus, symmetrical and full respiratory excursions Cardiovascular: normal position and quality of the apical impulse, regular rhythm, normal first heart sound and normald heart sound, no rubs or gallops, nourmur Abdomen: no tenderness or distention, no masses by palpation, no abnormal pulsatility or arterial bruits, normal bowel sounds, no hepatosplenomegaly Extremities: no clubbing, cyanosis;  nodema; 2+ radial, ulnar and brachial pulses bilaterally; 2+ right femoral, posterior tibial and dorsalis pedis pulses; 2+ left femoral, posterior tibial and dorsalis pedis pulses; no subclavian or femoral bruits Neurological: grossly nonfocal   LABS  CBC  Recent Labs  10/08/13 1158 10/08/13 1420 10/09/13 0535  WBC 8.9 7.3 7.3  NEUTROABS 6.1 5.1  --   HGB 9.3* 9.0* 8.0*  HCT 29.1* 28.2* 25.6*  MCV 83.7 83.4 83.9  PLT 453* 400 248   Basic Metabolic Panel  Recent Labs  10/08/13 1158 10/08/13 1420 10/09/13 0535  NA 139  --  140  K 4.2  --  4.0  CL  --   --  104  CO2 25  --  23  GLUCOSE 99  --  103*  BUN 8.6  --  6  CREATININE 0.7 0.58 0.65  CALCIUM 10.0  --  9.3  MG  --  1.7  --    Liver Function Tests  Recent Labs  10/08/13 1158  AST 26  ALT 24  ALKPHOS 72  BILITOT 0.46  PROT 7.2  ALBUMIN 3.0*   No results found for this basename: LIPASE, AMYLASE,  in the last 72 hours Cardiac Enzymes  Recent Labs  10/09/13 0857 10/09/13 1405  TROPONINI <0.30 <0.30   Thyroid Function Tests  Recent Labs  10/09/13 0857  TSH 2.650      IMAGING: Ct Angio Chest Pe W/cm &/or Wo Cm  10/08/2013   CLINICAL DATA:  Shortness of breath and tachycardia. Recent surgery, patient underwent  hysterectomy and bilateral salpingo-oophorectomy and lymphadenectomy for stage IA endometrioid endometrial cancer and synchronous stage IA grade 1 endometrioid ovarian cancer.  EXAM: CT ANGIOGRAPHY CHEST AND CT ABDOMEN AND PELVIS WITH CONTRAST  TECHNIQUE: Multidetector CT imaging of the chest, abdomen and pelvis was performed using the standard protocol during bolus administration of intravenous contrast. Multiplanar CT image reconstructions and MIPs were obtained to evaluate the vascular anatomy.  CONTRAST:  168mL OMNIPAQUE IOHEXOL 350 MG/ML SOLN  COMPARISON:  Chest radiograph 09/23/2013  FINDINGS: Chest: There is some patient respiratory motion that creates some linear artifacts in the ascending aorta and main pulmonary artery. This is a technically satisfactory examination of the pulmonary arterial tree. No focal filling defects are identified to suggest pulmonary embolism.  Small hiatal hernia.  The thoracic aorta is normal in caliber. Negative for aortic dissection.  Negative for lymphadenopathy in the chest. There is mild  cardiomegaly. There may be a trace pericardial effusion versus pericardial thickening inferiorly.  There is a trace right pleural effusion.  Lung windows demonstrate moderate patchy areas of atelectasis in both lower lobes, left greater than right, and in the left upper lobe. No definite consolidation.  No acute bony abnormality.  Abdomen/pelvis: Status postcholecystectomy. The liver is normal in size and enhancement. No liver mass or biliary ductal dilatation. The spleen is within normal limits for size. Nonspecific 6 mm low-density lesion in the spleen is noted on image 16. No prior studies for comparison.  The pancreas, adrenal glands, and kidneys are within normal limits.  Stomach is not very distended. Small bowel loops are normal in caliber and wall thickness. The colon is normal in caliber and wall thickness. Patient is status post hysterectomy. No adnexal mass.  There is a moderate  amount of free fluid in the pelvis. This measures simple water density. No evidence of pelvic abscess. Urinary bladder appears within normal limits.  There is a small amount of perihepatic ascites.  Mild subcutaneous stranding in the anterior abdominal wall, without fluid collections.  Abdominal aorta is normal in caliber. Negative for lymphadenopathy in the abdomen or pelvis. Negative for free intraperitoneal air.  No acute or suspicious bony abnormality. Facet joint degenerative changes of the lumbar spine noted.  Review of the MIP images confirms the above findings.  IMPRESSION: 1. No evidence of pulmonary embolism. 2. Moderate patchy areas of atelectasis in both lungs. 3. Trace right pleural effusion and possible trace pericardial effusion. 4. Mild cardiomegaly. 5. No evidence of intra-abdominal or intrapelvic abscess. 6. Small amount of abdominal free fluid and moderate amount pelvic free fluid. 7. Status post hysterectomy.  No adnexal mass or lymphadenopathy.   Electronically Signed   By: Curlene Dolphin M.D.   On: 10/08/2013 18:00   Ct Abdomen Pelvis W Contrast  10/08/2013   CLINICAL DATA:  Shortness of breath and tachycardia. 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  EXAM: CT ANGIOGRAPHY CHEST AND CT ABDOMEN AND PELVIS WITH CONTRAST  TECHNIQUE: Multidetector CT imaging of the chest, abdomen and pelvis was performed using the standard protocol during bolus administration of intravenous contrast. Multiplanar CT image reconstructions and MIPs were obtained to evaluate the vascular anatomy.  CONTRAST:  116mL OMNIPAQUE IOHEXOL 350 MG/ML SOLN  COMPARISON:  Chest radiograph 09/23/2013  FINDINGS: Chest: There is some patient respiratory motion that creates some linear artifacts in the ascending aorta and main pulmonary artery. This is a technically  satisfactory examination of the pulmonary arterial tree. No focal filling defects are identified to suggest pulmonary embolism.  Small hiatal hernia.  The thoracic aorta is normal in caliber. Negative for aortic dissection.  Negative for lymphadenopathy in the chest. There is mild cardiomegaly. There may be a trace pericardial effusion versus pericardial thickening inferiorly.  There is a trace right pleural effusion.  Lung windows demonstrate moderate patchy areas of atelectasis in both lower lobes, left greater than right, and in the left upper lobe. No definite consolidation.  No acute bony abnormality.  Abdomen/pelvis: Status postcholecystectomy. The liver is normal in size and enhancement. No liver mass or biliary ductal dilatation. The spleen is within normal limits for size. Nonspecific 6 mm low-density lesion in the spleen is noted on image 16. No prior studies for comparison.  The pancreas, adrenal glands, and kidneys are within normal limits.  Stomach is  not very distended. Small bowel loops are normal in caliber and wall thickness. The colon is normal in caliber and wall thickness. Patient is status post hysterectomy. No adnexal mass.  There is a moderate amount of free fluid in the pelvis. This measures simple water density. No evidence of pelvic abscess. Urinary bladder appears within normal limits.  There is a small amount of perihepatic ascites.  Mild subcutaneous stranding in the anterior abdominal wall, without fluid collections.  Abdominal aorta is normal in caliber. Negative for lymphadenopathy in the abdomen or pelvis. Negative for free intraperitoneal air.  No acute or suspicious bony abnormality. Facet joint degenerative changes of the lumbar spine noted.  Review of the MIP images confirms the above findings.  IMPRESSION: 1. No evidence of pulmonary embolism. 2. Moderate patchy areas of atelectasis in both lungs. 3. Trace right pleural effusion and possible trace pericardial effusion. 4. Mild  cardiomegaly. 5. No evidence of intra-abdominal or intrapelvic abscess. 6. Small amount of abdominal free fluid and moderate amount pelvic free fluid. 7. Status post hysterectomy.  No adnexal mass or lymphadenopathy.   Electronically Signed   By: Curlene Dolphin M.D.   On: 10/08/2013 18:00    ECG: Described above TELEMETRY: Sinus tachycardia Echo: LVH, LA dilation  IMPRESSION: ECG changes are secondary to LVH, voltage masked by obesity, ST depression accentuated by tachycardia. No evidence of coronary insufficiency by history or biochemical markers. Limited coronary risk factors. Tachycardia is caused by infection/fever and anemia.  RECOMMENDATION: Carvedilol has been initiated and is a good initial choice for BP control. Her BP is likely lower than usual at this time, due to her acute illness and fever-associated vasodilation. Will need titration of meds when she fully recovers. Would also recommend Cardiology f/u and probably a formal stress test when the current acute illness resolves. She would greatly benefit from weight loss and a sodium restricted diet in the long run.  Time Spent Directly with Patient: 50 minutes  Sanda Klein, MD, [facility] [phone] office [phone] pager   10/09/2013, 6:39 PM

## 2013-10-09 NOTE — Progress Notes (Signed)
MEDICATION RELATED CONSULT NOTE - INITIAL   Pharmacy Consult for IV Iron Indication: Iron deficiency anemia  Allergies  Allergen Reactions  . Contrast Media [Iodinated Diagnostic Agents] Anaphylaxis    Swelling of the throat, difficulty breathing  . Tetanus Toxoids     Swelling at site, lethargic x few days    Patient Measurements: Height: 5\' 2"  (157.5 cm) Weight: 284 lb 13.4 oz (129.2 kg) IBW/kg (Calculated) : 50.1 Adjusted Body Weight:   Vital Signs: Temp: 100.4 F (38 C) (09/12 1015) Temp src: Oral (09/12 1015) BP: 119/69 mmHg (09/12 1015) Pulse Rate: 118 (09/12 1015) Intake/Output from previous day: 09/11 0701 - 09/12 0700 In: 443.3 [I.V.:393.3; IV Piggyback:50] Out: 0347 [Urine:1850] Intake/Output from this shift: Total I/O In: 2691.7 [P.O.:120; I.V.:2471.7; IV Piggyback:100] Out: 250 [Urine:250]  Labs:  Recent Labs  10/08/13 1158 10/08/13 1158 10/08/13 1420 10/09/13 0535  WBC 8.9  --  7.3 7.3  HGB 9.3*  --  9.0* 8.0*  HCT 29.1*  --  28.2* 25.6*  PLT 453*  --  400 349  CREATININE  --  0.7 0.58 0.65  MG  --   --  1.7  --   ALBUMIN  --  3.0*  --   --   PROT  --  7.2  --   --   AST  --  26  --   --   ALT  --  24  --   --   ALKPHOS  --  72  --   --   BILITOT  --  0.46  --   --    Estimated Creatinine Clearance: 114.5 ml/min (by C-G formula based on Cr of 0.65).   Microbiology: Recent Results (from the past 720 hour(s))  CULTURE, BLOOD (ROUTINE X 2)     Status: None   Collection Time    10/08/13  2:06 PM      Result Value Ref Range Status   Specimen Description BLOOD RIGHT ARM   Final   Special Requests BOTTLES DRAWN AEROBIC AND ANAEROBIC  10 CC   Final   Culture  Setup Time     Final   Value: 10/08/2013 17:25     Performed at [facility]   Culture     Final   Value:        BLOOD CULTURE RECEIVED NO GROWTH TO DATE CULTURE WILL BE HELD FOR 5 DAYS BEFORE ISSUING A FINAL NEGATIVE REPORT     Performed at [facility]   Report Status  PENDING   Incomplete  CULTURE, BLOOD (ROUTINE X 2)     Status: None   Collection Time    10/08/13  2:11 PM      Result Value Ref Range Status   Specimen Description BLOOD LEFT ARM   Final   Special Requests BOTTLES DRAWN AEROBIC ONLY  3CC   Final   Culture  Setup Time     Final   Value: 10/08/2013 17:24     Performed at [facility]   Culture     Final   Value:        BLOOD CULTURE RECEIVED NO GROWTH TO DATE CULTURE WILL BE HELD FOR 5 DAYS BEFORE ISSUING A FINAL NEGATIVE REPORT     Performed at [facility]   Report Status PENDING   Incomplete    Medical History: Past Medical History  Diagnosis Date  . PONV (postoperative nausea and vomiting)   . Hypertension  not taking med x1 yr. couldn't afford.  Marland Kitchen UTI (lower urinary tract infection)     history of 1 month ago  . Cancer     Uterine cancer dx. surgery planned TAH  . Uterine cancer   . Ovarian cancer    Assessment: 45 yoF with PMHx HTN, morbid obesity, and endometrial and ovarian cancer s/p hysterectomy, BOS, and lymphadenectomy on 9/1 presented 9/11 with fever and abdominal pain, found to have SIRS and anemia.  Pharmacy consulted to dose IV iron x 1.  Already started on ferrous sulfate 325mg  PO TID.   9/12:  Iron panel shows low iron stores and saturation of 3%. Hgb trending down at 8.0.  No s/sxs bleeding.  Calculated iron deficit using goal hemoglobin of 12 = 1095mg .    Goal of Therapy:  Hgb > 12g/dL  Plan:  Feraheme 1020mg  IV x 1 .  Monitor closely for allergic reactions, hypotension.    Ralene Bathe, PharmD, BCPS 10/09/2013, 3:19 PM  Pager: [phone]

## 2013-10-09 NOTE — Progress Notes (Signed)
[facility] PROGRESS NOTE  Shannon [name] YTK:[id number] DOB: July 19, 1965 DOA: 10/08/2013 PCP: Odette Fraction, MD  Assessment/Plan: #1 systemic inflammatory response syndrome Unknown etiology. CT of the chest abdomen and pelvis negative for any acute abnormalities or abscess formation. Urinalysis is negative. Patient still with fevers. Patient with normal white count. Blood cultures are pending. Continue empiric IV vancomycin IV Zosyn. Follow.  #2 tachycardia Unknown etiology. Maybe secondary to pain versus #1. TSH within normal limits at 2.650. Cardiac enzymes negative x2. EKG with sinus tachycardia and ST-T wave abnormalities in these 23 aVF V3 through V6. More pronounced than on prior EKGs. Will check a 2-D echo. Place on low dose beta blocker. Follow.   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  #4 postop fever Unknown etiology. Urinalysis negative. CT chest negative. CT abdomen and pelvis negative for any acute abs abnormalities. Blood cultures are pending. Continue empiric IV vancomycin and IV Zosyn.  #5 anemia Anemia panel consistent with iron deficiency anemia with iron level of less than 10 and also likely with a dilutional component. Patient with no overt bleeding. Follow H&H. Given a dose of IV iron. Will place on oral iron supplements.  #6 morbid obesity  #7 prophylaxis Lovenox for DVT prophylaxis.  Code Status: Full Family Communication: Updated patient and sister at the bedside. Disposition Plan: Home when medically stable.   Consultants:  GYN/oncology: Dr. Denman George 10/08/2013  Procedures:  CT chest, abdomen, pelvis 10/08/2013  Antibiotics:  IV Vancomycin 10/08/13  IV Zosyn 10/08/2013  HPI/Subjective: Patient states feeling better.  Objective: Filed Vitals:   10/09/13 1015  BP: 119/69  Pulse: 118   Temp: 100.4 F (38 C)  Resp: 20    Intake/Output Summary (Last 24 hours) at 10/09/13 1222 Last data filed at 10/09/13 1000  Gross per 24 hour  Intake 613.33 ml  Output   2100 ml  Net -1486.67 ml   Filed Weights   10/08/13 1339 10/09/13 1015  Weight: 128.822 kg (284 lb) 129.2 kg (284 lb 13.4 oz)    Exam:   General:  nad  Cardiovascular: rrr  Respiratory: ctab  Abdomen: soft/nt/nd/+bs  Musculoskeletal: No c/c/e  Data Reviewed: Basic Metabolic Panel:  Recent Labs Lab 10/08/13 1158 10/08/13 1420 10/09/13 0535  NA 139  --  140  K 4.2  --  4.0  CL  --   --  104  CO2 25  --  23  GLUCOSE 99  --  103*  BUN 8.6  --  6  CREATININE 0.7 0.58 0.65  CALCIUM 10.0  --  9.3  MG  --  1.7  --    Liver Function Tests:  Recent Labs Lab 10/08/13 1158  AST 26  ALT 24  ALKPHOS 72  BILITOT 0.46  PROT 7.2  ALBUMIN 3.0*   No results found for this basename: LIPASE, AMYLASE,  in the last 168 hours No results found for this basename: AMMONIA,  in the last 168 hours CBC:  Recent Labs Lab 10/08/13 1158 10/08/13 1420 10/09/13 0535  WBC 8.9 7.3 7.3  NEUTROABS 6.1 5.1  --   HGB 9.3* 9.0* 8.0*  HCT 29.1* 28.2* 25.6*  MCV 83.7 83.4 83.9  PLT 453* 400 349   Cardiac Enzymes:  Recent Labs Lab 10/09/13 0857  TROPONINI <0.30   BNP (last 3 results) No results found for this basename: PROBNP,  in the last 8760 hours  CBG: No results found for this basename: GLUCAP,  in the last 168 hours  No results found for this or any previous visit (from the past 240 hour(s)).   Studies: Ct Angio Chest Pe W/cm &/or Wo Cm  10/08/2013   CLINICAL DATA:  Shortness of breath and tachycardia. 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  EXAM: CT ANGIOGRAPHY CHEST AND CT ABDOMEN AND PELVIS WITH CONTRAST  TECHNIQUE:  Multidetector CT imaging of the chest, abdomen and pelvis was performed using the standard protocol during bolus administration of intravenous contrast. Multiplanar CT image reconstructions and MIPs were obtained to evaluate the vascular anatomy.  CONTRAST:  156mL OMNIPAQUE IOHEXOL 350 MG/ML SOLN  COMPARISON:  Chest radiograph 09/23/2013  FINDINGS: Chest: There is some patient respiratory motion that creates some linear artifacts in the ascending aorta and main pulmonary artery. This is a technically satisfactory examination of the pulmonary arterial tree. No focal filling defects are identified to suggest pulmonary embolism.  Small hiatal hernia.  The thoracic aorta is normal in caliber. Negative for aortic dissection.  Negative for lymphadenopathy in the chest. There is mild cardiomegaly. There may be a trace pericardial effusion versus pericardial thickening inferiorly.  There is a trace right pleural effusion.  Lung windows demonstrate moderate patchy areas of atelectasis in both lower lobes, left greater than right, and in the left upper lobe. No definite consolidation.  No acute bony abnormality.  Abdomen/pelvis: Status postcholecystectomy. The liver is normal in size and enhancement. No liver mass or biliary ductal dilatation. The spleen is within normal limits for size. Nonspecific 6 mm low-density lesion in the spleen is noted on image 16. No prior studies for comparison.  The pancreas, adrenal glands, and kidneys are within normal limits.  Stomach is not very distended. Small bowel loops are normal in caliber and wall thickness. The colon is normal in caliber and wall thickness. Patient is status post hysterectomy. No adnexal mass.  There is a moderate amount of free fluid in the pelvis. This measures simple water density. No evidence of pelvic abscess. Urinary bladder appears within normal limits.  There is a small amount of perihepatic ascites.  Mild subcutaneous stranding in the anterior abdominal wall,  without fluid collections.  Abdominal aorta is normal in caliber. Negative for lymphadenopathy in the abdomen or pelvis. Negative for free intraperitoneal air.  No acute or suspicious bony abnormality. Facet joint degenerative changes of the lumbar spine noted.  Review of the MIP images confirms the above findings.  IMPRESSION: 1. No evidence of pulmonary embolism. 2. Moderate patchy areas of atelectasis in both lungs. 3. Trace right pleural effusion and possible trace pericardial effusion. 4. Mild cardiomegaly. 5. No evidence of intra-abdominal or intrapelvic abscess. 6. Small amount of abdominal free fluid and moderate amount pelvic free fluid. 7. Status post hysterectomy.  No adnexal mass or lymphadenopathy.   Electronically Signed   By: Curlene Dolphin M.D.   On: 10/08/2013 18:00   Ct Abdomen Pelvis W Contrast  10/08/2013   CLINICAL DATA:  Shortness of breath and tachycardia. 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  EXAM: CT ANGIOGRAPHY CHEST AND CT ABDOMEN AND PELVIS WITH CONTRAST  TECHNIQUE: Multidetector CT imaging of the chest, abdomen and pelvis was performed using the standard protocol during bolus administration of  intravenous contrast. Multiplanar CT image reconstructions and MIPs were obtained to evaluate the vascular anatomy.  CONTRAST:  159mL OMNIPAQUE IOHEXOL 350 MG/ML SOLN  COMPARISON:  Chest radiograph 09/23/2013  FINDINGS: Chest: There is some patient respiratory motion that creates some linear artifacts in the ascending aorta and main pulmonary artery. This is a technically satisfactory examination of the pulmonary arterial tree. No focal filling defects are identified to suggest pulmonary embolism.  Small hiatal hernia.  The thoracic aorta is normal in caliber. Negative for aortic dissection.  Negative for lymphadenopathy in  the chest. There is mild cardiomegaly. There may be a trace pericardial effusion versus pericardial thickening inferiorly.  There is a trace right pleural effusion.  Lung windows demonstrate moderate patchy areas of atelectasis in both lower lobes, left greater than right, and in the left upper lobe. No definite consolidation.  No acute bony abnormality.  Abdomen/pelvis: Status postcholecystectomy. The liver is normal in size and enhancement. No liver mass or biliary ductal dilatation. The spleen is within normal limits for size. Nonspecific 6 mm low-density lesion in the spleen is noted on image 16. No prior studies for comparison.  The pancreas, adrenal glands, and kidneys are within normal limits.  Stomach is not very distended. Small bowel loops are normal in caliber and wall thickness. The colon is normal in caliber and wall thickness. Patient is status post hysterectomy. No adnexal mass.  There is a moderate amount of free fluid in the pelvis. This measures simple water density. No evidence of pelvic abscess. Urinary bladder appears within normal limits.  There is a small amount of perihepatic ascites.  Mild subcutaneous stranding in the anterior abdominal wall, without fluid collections.  Abdominal aorta is normal in caliber. Negative for lymphadenopathy in the abdomen or pelvis. Negative for free intraperitoneal air.  No acute or suspicious bony abnormality. Facet joint degenerative changes of the lumbar spine noted.  Review of the MIP images confirms the above findings.  IMPRESSION: 1. No evidence of pulmonary embolism. 2. Moderate patchy areas of atelectasis in both lungs. 3. Trace right pleural effusion and possible trace pericardial effusion. 4. Mild cardiomegaly. 5. No evidence of intra-abdominal or intrapelvic abscess. 6. Small amount of abdominal free fluid and moderate amount pelvic free fluid. 7. Status post hysterectomy.  No adnexal mass or lymphadenopathy.   Electronically Signed   By: Curlene Dolphin M.D.   On: 10/08/2013 18:00    Scheduled Meds: . docusate sodium  100 mg Oral BID  . enoxaparin (LOVENOX) injection  60 mg Subcutaneous Q24H  . piperacillin-tazobactam (ZOSYN)  IV  3.375 g Intravenous Q8H  . vancomycin  1,250 mg Intravenous Q12H   Continuous Infusions: . sodium chloride 125 mL/hr (10/09/13 0820)    Principal Problem:   SIRS (systemic inflammatory response syndrome) Active Problems:   Postoperative fever   Endometrial cancer, grade I   Morbid obesity   Ovarian cancer on left   Anemia, iron deficiency   Tachycardia   Dyspnea    Time spent: [place] MD [facility] Pager [phone]. If 7PM-7AM, please contact night-coverage at [email or web address], password [facility] 10/09/2013, 12:22 PM  LOS: 1 day

## 2013-10-10 LAB — BASIC METABOLIC PANEL
Anion gap: 11 (ref 5–15)
BUN: 5 mg/dL — AB (ref 6–23)
CO2: 24 meq/L (ref 19–32)
Calcium: 9.5 mg/dL (ref 8.4–10.5)
Chloride: 107 mEq/L (ref 96–112)
Creatinine, Ser: 0.61 mg/dL (ref 0.50–1.10)
GFR calc non Af Amer: >90 mL/min (ref >90)
Glucose, Bld: 103 mg/dL — ABNORMAL HIGH (ref 70–99)
POTASSIUM: 3.7 meq/L (ref 3.7–5.3)
Sodium: 142 mEq/L (ref 137–147)

## 2013-10-10 LAB — CBC
HCT: 31.7 % — ABNORMAL LOW (ref 36.0–46.0)
HEMATOCRIT: 24.5 % — AB (ref 36.0–46.0)
HEMOGLOBIN: 7.9 g/dL — AB (ref 12.0–15.0)
Hemoglobin: 10.2 g/dL — ABNORMAL LOW (ref 12.0–15.0)
MCH: 27.1 pg (ref 26.0–34.0)
MCH: 27.3 pg (ref 26.0–34.0)
MCHC: 32.2 g/dL (ref 30.0–36.0)
MCHC: 32.2 g/dL (ref 30.0–36.0)
MCV: 83.9 fL (ref 78.0–100.0)
MCV: 84.8 fL (ref 78.0–100.0)
PLATELETS: 327 10*3/uL (ref 150–400)
Platelets: 305 10*3/uL (ref 150–400)
RBC: 2.92 MIL/uL — ABNORMAL LOW (ref 3.87–5.11)
RBC: 3.74 MIL/uL — AB (ref 3.87–5.11)
RDW: 13 % (ref 11.5–15.5)
RDW: 13.1 % (ref 11.5–15.5)
WBC: 5.5 10*3/uL (ref 4.0–10.5)
WBC: 7.2 10*3/uL (ref 4.0–10.5)

## 2013-10-10 LAB — PREPARE RBC (CROSSMATCH)

## 2013-10-10 MED ORDER — SODIUM CHLORIDE 0.9 % IV SOLN
Freq: Once | INTRAVENOUS | Status: AC
  Administered 2013-10-10: 11:00:00 via INTRAVENOUS

## 2013-10-10 MED ORDER — MORPHINE SULFATE 2 MG/ML IJ SOLN
2.0000 mg | Freq: Once | INTRAMUSCULAR | Status: DC
  Filled 2013-10-10: qty 1

## 2013-10-10 MED ORDER — FUROSEMIDE 10 MG/ML IJ SOLN
20.0000 mg | Freq: Once | INTRAMUSCULAR | Status: AC
  Administered 2013-10-10: 20 mg via INTRAVENOUS
  Filled 2013-10-10: qty 2

## 2013-10-10 MED ORDER — ACETAMINOPHEN 325 MG PO TABS
650.0000 mg | ORAL_TABLET | Freq: Once | ORAL | Status: AC
  Administered 2013-10-10: 650 mg via ORAL
  Filled 2013-10-10: qty 2

## 2013-10-10 MED ORDER — DIPHENHYDRAMINE HCL 25 MG PO CAPS
25.0000 mg | ORAL_CAPSULE | Freq: Once | ORAL | Status: AC
  Administered 2013-10-10: 25 mg via ORAL
  Filled 2013-10-10: qty 1

## 2013-10-10 NOTE — Progress Notes (Signed)
[facility] PROGRESS NOTE  Shannon [name] UTM:[id number] DOB: [birth year] DOA: 10/08/2013 PCP: Odette Fraction, MD  Assessment/Plan: #1 systemic inflammatory response syndrome Unknown etiology. CT of the chest abdomen and pelvis negative for any acute abnormalities or abscess formation. Urinalysis is negative. Patient still with fevers. Patient with normal white count. Blood cultures are pending. Continue empiric IV vancomycin IV Zosyn. Follow.  #2 tachycardia/abnormal EKG Unknown etiology. Maybe secondary to pain versus #1, pain and probably an anemia. TSH within normal limits at 2.650. Cardiac enzymes negative x2. EKG with sinus tachycardia and ST-T wave abnormalities in these 23 aVF V3 through V6. More pronounced than on prior EKGs. 2-D echo with EF of 65-70% with no wall motion abnormalities. Mildly dilated left atrium. Mild left ventricular concentric hypertrophy. Continue low-dose beta blocker. Will transfuse 2 units packed red blood cells as tachycardia may be secondary to anemia. Patient has been seen by cardiology, and to patient's tachycardia my likely be secondary to acute illness with fever associated vasodilatation and anemia. No EKG changes secondary to LVH, voltage mass by obesity and the ST depression likely secondary to tachycardia. Cardiology recommended outpatient followup for probable formal stress test as outpatient. Lifestyle modifications.. Follow.   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  #4 postop fever Unknown etiology. Urinalysis negative. CT chest negative. CT abdomen and pelvis negative for any acute abs abnormalities. Blood cultures are pending. Fever curve trending down. Continue empiric IV vancomycin and IV Zosyn.  #5 anemia/severe iron deficiency anemia Anemia panel consistent with iron deficiency anemia  with iron level of less than 10 and also likely with a dilutional component. Patient with no overt bleeding. Hemoglobin currently at 7.9. Patient is status post IV iron. Patient currently on oral iron supplementation. Will transfuse 2 units of packed red blood cells as patient is noted to be tachycardic and symptomatic. Follow H&H.   #6 morbid obesity  #7 prophylaxis Lovenox for DVT prophylaxis.  Code Status: Full Family Communication: Updated patient and sister at the bedside. Disposition Plan: Home when medically stable.   Consultants:  GYN/oncology: Dr. Denman George 10/08/2013  Cardiology: Dr. Sallyanne Kuster 10/09/2013  Procedures:  CT chest, abdomen, pelvis 10/08/2013  2 units packed red blood cells pending  2-D echo 10/09/2013  Antibiotics:  IV Vancomycin 10/08/13  IV Zosyn 10/08/2013  HPI/Subjective: Patient states feeling better. Abdominal pain has improved per patient.  Objective: Filed Vitals:   10/10/13 1112  BP: 129/77  Pulse: 110  Temp: 98.3 F (36.8 C)  Resp: 24    Intake/Output Summary (Last 24 hours) at 10/10/13 1141 Last data filed at 10/10/13 1024  Gross per 24 hour  Intake 2514.42 ml  Output   1550 ml  Net 964.42 ml   Filed Weights   10/08/13 1339 10/09/13 1015  Weight: 128.822 kg (284 lb) 129.2 kg (284 lb 13.4 oz)    Exam:   General:  nad  Cardiovascular: rrr  Respiratory: ctab  Abdomen: soft/nt/nd/+bs  Musculoskeletal: No c/c/e  Data Reviewed: Basic Metabolic Panel:  Recent Labs Lab 10/08/13 1158 10/08/13 1420 10/09/13 0535 10/10/13 0625  NA 139  --  140 142  K 4.2  --  4.0 3.7  CL  --   --  104 107  CO2 25  --  23 24  GLUCOSE 99  --  103* 103*  BUN 8.6  --  6 5*  CREATININE 0.7 0.58 0.65  0.61  CALCIUM 10.0  --  9.3 9.5  MG  --  1.7  --   --    Liver Function Tests:  Recent Labs Lab 10/08/13 1158  AST 26  ALT 24  ALKPHOS 72  BILITOT 0.46  PROT 7.2  ALBUMIN 3.0*   No results found for this basename: LIPASE,  AMYLASE,  in the last 168 hours No results found for this basename: AMMONIA,  in the last 168 hours CBC:  Recent Labs Lab 10/08/13 1158 10/08/13 1420 10/09/13 0535 10/10/13 0625  WBC 8.9 7.3 7.3 5.5  NEUTROABS 6.1 5.1  --   --   HGB 9.3* 9.0* 8.0* 7.9*  HCT 29.1* 28.2* 25.6* 24.5*  MCV 83.7 83.4 83.9 83.9  PLT 453* 400 349 305   Cardiac Enzymes:  Recent Labs Lab 10/09/13 0857 10/09/13 1405 10/09/13 2004  TROPONINI <0.30 <0.30 <0.30   BNP (last 3 results) No results found for this basename: PROBNP,  in the last 8760 hours CBG: No results found for this basename: GLUCAP,  in the last 168 hours  Recent Results (from the past 240 hour(s))  URINE CULTURE     Status: None   Collection Time    10/08/13 12:27 PM      Result Value Ref Range Status   Urine Culture, Routine Culture, Urine   Final   Comment: Final - ===== COLONY COUNT: =====     45,000 COLONIES/ML     Multiple bacterial morphotypes present, none     predominant. Suggest appropriate recollection if      clinically indicated.  CULTURE, BLOOD (ROUTINE X 2)     Status: None   Collection Time    10/08/13  2:06 PM      Result Value Ref Range Status   Specimen Description BLOOD RIGHT ARM   Final   Special Requests BOTTLES DRAWN AEROBIC AND ANAEROBIC  10 CC   Final   Culture  Setup Time     Final   Value: 10/08/2013 17:25     Performed at [facility]   Culture     Final   Value:        BLOOD CULTURE RECEIVED NO GROWTH TO DATE CULTURE WILL BE HELD FOR 5 DAYS BEFORE ISSUING A FINAL NEGATIVE REPORT     Performed at [facility]   Report Status PENDING   Incomplete  CULTURE, BLOOD (ROUTINE X 2)     Status: None   Collection Time    10/08/13  2:11 PM      Result Value Ref Range Status   Specimen Description BLOOD LEFT ARM   Final   Special Requests BOTTLES DRAWN AEROBIC ONLY  3CC   Final   Culture  Setup Time     Final   Value: 10/08/2013 17:24     Performed at [facility]   Culture      Final   Value:        BLOOD CULTURE RECEIVED NO GROWTH TO DATE CULTURE WILL BE HELD FOR 5 DAYS BEFORE ISSUING A FINAL NEGATIVE REPORT     Performed at [facility]   Report Status PENDING   Incomplete     Studies: Ct Angio Chest Pe W/cm &/or Wo Cm  10/08/2013   CLINICAL DATA:  Shortness of breath and tachycardia. 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  EXAM: CT  ANGIOGRAPHY CHEST AND CT ABDOMEN AND PELVIS WITH CONTRAST  TECHNIQUE: Multidetector CT imaging of the chest, abdomen and pelvis was performed using the standard protocol during bolus administration of intravenous contrast. Multiplanar CT image reconstructions and MIPs were obtained to evaluate the vascular anatomy.  CONTRAST:  153mL OMNIPAQUE IOHEXOL 350 MG/ML SOLN  COMPARISON:  Chest radiograph 09/23/2013  FINDINGS: Chest: There is some patient respiratory motion that creates some linear artifacts in the ascending aorta and main pulmonary artery. This is a technically satisfactory examination of the pulmonary arterial tree. No focal filling defects are identified to suggest pulmonary embolism.  Small hiatal hernia.  The thoracic aorta is normal in caliber. Negative for aortic dissection.  Negative for lymphadenopathy in the chest. There is mild cardiomegaly. There may be a trace pericardial effusion versus pericardial thickening inferiorly.  There is a trace right pleural effusion.  Lung windows demonstrate moderate patchy areas of atelectasis in both lower lobes, left greater than right, and in the left upper lobe. No definite consolidation.  No acute bony abnormality.  Abdomen/pelvis: Status postcholecystectomy. The liver is normal in size and enhancement. No liver mass or biliary ductal dilatation. The spleen is within normal limits for size. Nonspecific 6 mm low-density  lesion in the spleen is noted on image 16. No prior studies for comparison.  The pancreas, adrenal glands, and kidneys are within normal limits.  Stomach is not very distended. Small bowel loops are normal in caliber and wall thickness. The colon is normal in caliber and wall thickness. Patient is status post hysterectomy. No adnexal mass.  There is a moderate amount of free fluid in the pelvis. This measures simple water density. No evidence of pelvic abscess. Urinary bladder appears within normal limits.  There is a small amount of perihepatic ascites.  Mild subcutaneous stranding in the anterior abdominal wall, without fluid collections.  Abdominal aorta is normal in caliber. Negative for lymphadenopathy in the abdomen or pelvis. Negative for free intraperitoneal air.  No acute or suspicious bony abnormality. Facet joint degenerative changes of the lumbar spine noted.  Review of the MIP images confirms the above findings.  IMPRESSION: 1. No evidence of pulmonary embolism. 2. Moderate patchy areas of atelectasis in both lungs. 3. Trace right pleural effusion and possible trace pericardial effusion. 4. Mild cardiomegaly. 5. No evidence of intra-abdominal or intrapelvic abscess. 6. Small amount of abdominal free fluid and moderate amount pelvic free fluid. 7. Status post hysterectomy.  No adnexal mass or lymphadenopathy.   Electronically Signed   By: Curlene Dolphin M.D.   On: 10/08/2013 18:00   Ct Abdomen Pelvis W Contrast  10/08/2013   CLINICAL DATA:  Shortness of breath and tachycardia. 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  EXAM: CT ANGIOGRAPHY CHEST AND CT ABDOMEN AND PELVIS WITH CONTRAST  TECHNIQUE: Multidetector CT imaging of the chest, abdomen and pelvis was performed using the standard protocol during bolus administration of intravenous  contrast. Multiplanar CT image reconstructions and MIPs were obtained to evaluate the vascular anatomy.  CONTRAST:  117mL OMNIPAQUE IOHEXOL 350 MG/ML SOLN  COMPARISON:  Chest radiograph 09/23/2013  FINDINGS: Chest: There is some patient respiratory motion that creates some linear artifacts in the ascending aorta and main pulmonary artery. This is a technically satisfactory examination of the pulmonary arterial tree. No focal filling defects are identified to suggest pulmonary embolism.  Small hiatal hernia.  The thoracic  aorta is normal in caliber. Negative for aortic dissection.  Negative for lymphadenopathy in the chest. There is mild cardiomegaly. There may be a trace pericardial effusion versus pericardial thickening inferiorly.  There is a trace right pleural effusion.  Lung windows demonstrate moderate patchy areas of atelectasis in both lower lobes, left greater than right, and in the left upper lobe. No definite consolidation.  No acute bony abnormality.  Abdomen/pelvis: Status postcholecystectomy. The liver is normal in size and enhancement. No liver mass or biliary ductal dilatation. The spleen is within normal limits for size. Nonspecific 6 mm low-density lesion in the spleen is noted on image 16. No prior studies for comparison.  The pancreas, adrenal glands, and kidneys are within normal limits.  Stomach is not very distended. Small bowel loops are normal in caliber and wall thickness. The colon is normal in caliber and wall thickness. Patient is status post hysterectomy. No adnexal mass.  There is a moderate amount of free fluid in the pelvis. This measures simple water density. No evidence of pelvic abscess. Urinary bladder appears within normal limits.  There is a small amount of perihepatic ascites.  Mild subcutaneous stranding in the anterior abdominal wall, without fluid collections.  Abdominal aorta is normal in caliber. Negative for lymphadenopathy in the abdomen or pelvis. Negative for free  intraperitoneal air.  No acute or suspicious bony abnormality. Facet joint degenerative changes of the lumbar spine noted.  Review of the MIP images confirms the above findings.  IMPRESSION: 1. No evidence of pulmonary embolism. 2. Moderate patchy areas of atelectasis in both lungs. 3. Trace right pleural effusion and possible trace pericardial effusion. 4. Mild cardiomegaly. 5. No evidence of intra-abdominal or intrapelvic abscess. 6. Small amount of abdominal free fluid and moderate amount pelvic free fluid. 7. Status post hysterectomy.  No adnexal mass or lymphadenopathy.   Electronically Signed   By: Curlene Dolphin M.D.   On: 10/08/2013 18:00    Scheduled Meds: . sodium chloride   Intravenous Once  . carvedilol  3.125 mg Oral BID WC  . docusate sodium  100 mg Oral BID  . enoxaparin (LOVENOX) injection  60 mg Subcutaneous Q24H  . ferrous sulfate  325 mg Oral TID WC  . furosemide  20 mg Intravenous Once  . piperacillin-tazobactam (ZOSYN)  IV  3.375 g Intravenous Q8H  . vancomycin  1,250 mg Intravenous Q12H   Continuous Infusions:    Principal Problem:   SIRS (systemic inflammatory response syndrome) Active Problems:   Postoperative fever   Endometrial cancer, grade I   Morbid obesity   Ovarian cancer on left   Anemia, iron deficiency   Tachycardia   Dyspnea    Time spent: [place] MD [facility] Pager [phone]. If 7PM-7AM, please contact night-coverage at [email or web address], password [facility] 10/10/2013, 11:41 AM  LOS: 2 days

## 2013-10-11 ENCOUNTER — Ambulatory Visit: Payer: BC Managed Care – PPO | Admitting: Gynecologic Oncology

## 2013-10-11 DIAGNOSIS — R9431 Abnormal electrocardiogram [ECG] [EKG]: Secondary | ICD-10-CM

## 2013-10-11 LAB — BASIC METABOLIC PANEL
ANION GAP: 14 (ref 5–15)
BUN: 5 mg/dL — ABNORMAL LOW (ref 6–23)
CALCIUM: 9.4 mg/dL (ref 8.4–10.5)
CO2: 22 mEq/L (ref 19–32)
CREATININE: 0.56 mg/dL (ref 0.50–1.10)
Chloride: 101 mEq/L (ref 96–112)
GFR calc non Af Amer: >90 mL/min (ref >90)
Glucose, Bld: 96 mg/dL (ref 70–99)
Potassium: 3.6 mEq/L — ABNORMAL LOW (ref 3.7–5.3)
Sodium: 137 mEq/L (ref 137–147)

## 2013-10-11 LAB — TYPE AND SCREEN
ABO/RH(D): O POS
ANTIBODY SCREEN: NEGATIVE
UNIT DIVISION: 0
Unit division: 0

## 2013-10-11 LAB — CBC
HCT: 28.6 % — ABNORMAL LOW (ref 36.0–46.0)
Hemoglobin: 9.5 g/dL — ABNORMAL LOW (ref 12.0–15.0)
MCH: 27.5 pg (ref 26.0–34.0)
MCHC: 33.2 g/dL (ref 30.0–36.0)
MCV: 82.9 fL (ref 78.0–100.0)
Platelets: 348 10*3/uL (ref 150–400)
RBC: 3.45 MIL/uL — ABNORMAL LOW (ref 3.87–5.11)
RDW: 13.2 % (ref 11.5–15.5)
WBC: 6.5 10*3/uL (ref 4.0–10.5)

## 2013-10-11 MED ORDER — POTASSIUM CHLORIDE CRYS ER 20 MEQ PO TBCR
40.0000 meq | EXTENDED_RELEASE_TABLET | Freq: Once | ORAL | Status: AC
Start: 2013-10-11 — End: 2013-10-11
  Administered 2013-10-11: 40 meq via ORAL
  Filled 2013-10-11: qty 2

## 2013-10-11 MED ORDER — SORBITOL 70 % SOLN
30.0000 mL | Freq: Every day | Status: DC | PRN
  Filled 2013-10-11: qty 30

## 2013-10-11 MED ORDER — CARVEDILOL 6.25 MG PO TABS
6.2500 mg | ORAL_TABLET | Freq: Two times a day (BID) | ORAL | Status: DC
  Administered 2013-10-11 – 2013-10-12 (×3): 6.25 mg via ORAL
  Filled 2013-10-11 (×4): qty 1

## 2013-10-11 NOTE — Progress Notes (Signed)
Consultation Inpatient - subsequent note  CC: postop fevers   Subjective: Shannon [name] cotinues to feel better today after a reduction in fevers and s/p PRBC transfusion yesterday. Overall little pain. Mostly feelings of abdominal fullness and general malaise.  Her Tmax was 100.4 yesterday (10/10/13) at 5pm.   HPI:  Shannon [name] is a 48 y.o. year old. initially seen in consultation on 09/02/13 for grade 1 endometrial cancer.  She then underwent a robotic hysterectomy, BSO and lymphadenectomy on 02/03/08 without complications.  Her postoperative course was uncomplicated and she was discharged on postoperative day 1.  Her final pathologic diagnosis is a Stage IA Grade 1 endometrioid endometrial cancer with no lymphovascular space invasion, no myometrial invasion and negative lymph nodes. 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  She was seen on 10/08/13 for a postoperative check and to discuss her pathology results and treatment plan. She reported feeling progressively worse. She had been having intermittent fevers (100.2-102) for the previous 48-72 hours. She feels general malaise and abdominal pain. She is passing flatus and having bowel movements. She has some suprapubic discomfort not dissimilar to a UTI. She has only minimal serous drainage from the right mid abdominal port site.  She was admitted to the hospital with SIRS and work up for postoperative fevers. Labs were unremarkable (including mild, appropriate postop anemia, and a normal WBC with no left shift). UA was unremarkable Blood cultures were drawn.  CT of the chest, abdo and pelvis on 10/08/13 revealed:  1. No evidence of pulmonary embolism.  2. Moderate patchy areas of atelectasis in both lungs.  3. Trace right pleural effusion and possible trace pericardial  effusion.  4. Mild cardiomegaly.  5.  No evidence of intra-abdominal or intrapelvic abscess.  6. Small amount of abdominal free fluid and moderate amount pelvic  free fluid.  7. Status post hysterectomy. No adnexal mass or lymphadenopathy  The free fluid seen on scan is almost certainly lymphatic fluid s/p lymphadenectomy.  She was empirically started on vanc and zosyn (day was was 10/08/13)  Allergies  Allergen Reactions  . Contrast Media [Iodinated Diagnostic Agents] Anaphylaxis    Swelling of the throat, difficulty breathing  . Tetanus Toxoids     Swelling at site, lethargic x few days   Outpatient Encounter Prescriptions as of 10/08/2013  Medication Sig  . oxyCODONE-acetaminophen (PERCOCET/ROXICET) 5-325 MG per tablet Take 1-2 tablets by mouth every 4 (four) hours as needed for severe pain (moderate to severe pain (when tolerating fluids)).    Past Medical History  Diagnosis Date  . PONV (postoperative nausea and vomiting)   . Hypertension     not taking med x1 yr. couldn't afford.  Marland Kitchen UTI (lower urinary tract infection)     history of 1 month ago  . Cancer     Uterine cancer dx. surgery planned TAH  . Uterine cancer   . Ovarian cancer    Past Surgical History  Procedure Laterality Date  . Cholecystectomy      laparoscopic  . Robotic assisted total hysterectomy with bilateral salpingo oopherectomy Bilateral 09/28/2013    Procedure: ROBOTIC ASSISTED TOTAL HYSTERECTOMY WITH BILATERAL SALPINGO OOPHORECTOMY WITH  LYMPH NODE DISECTION, ;  Surgeon: Everitt Amber, MD;  Location: [facility];  Service: Gynecology;  Laterality: Bilateral;  History reviewed. No pertinent family history.  History   Social History  . Marital Status: Single  Spouse Name: N/A    Number of Children: N/A  . Years of Education: N/A   Social History Main Topics  . Smoking status: Never Smoker   . Smokeless tobacco: None  . Alcohol Use: No  . Drug Use: No  . Sexual Activity: Not Currently   Other Topics Concern  . None   Social History  Narrative  . None    Review of systems: Constitutional:  She has no weight gain or weight loss. Improved fever or chills. General malaise. Eyes: No blurred vision Ears, Nose, Mouth, Throat: No dizziness, headaches or changes in hearing. No mouth sores. Cardiovascular: No chest pain, palpitations or edema. Respiratory:  No shortness of breath, wheezing or cough Gastrointestinal: She has normal bowel movements without diarrhea or constipation. She denies any nausea or vomiting. She denies blood in her stool or heart burn. Genitourinary:  + pelvic pain and pelvic pressure. She has no hematuria, no dysuria, no incontinence. She has no irregular vaginal bleeding or vaginal discharge Musculoskeletal: Denies muscle weakness or joint pains.  Skin:  She has no skin changes, rashes or itching Neurological:  Denies dizziness or headaches. No neuropathy, no numbness or tingling. Psychiatric:  She denies depression or anxiety. Hematologic/Lymphatic:   No easy bruising or bleeding   Physical Exam: Blood pressure 120/72, pulse 102, temperature 99.5 F (37.5 C), temperature source Oral, resp. rate 18, height 5\' 2"  (1.575 m), weight 285 lb (129.275 kg), last menstrual period 08/06/2013, SpO2 92.00%.  General: Well dressed, well nourished in no apparent distress.   HEENT:  Normocephalic and atraumatic, no lesions.  Extraocular muscles intact. Sclerae anicteric. Pupils equal, round, reactive. No mouth sores or ulcers. Thyroid is normal size, not nodular, midline. Skin:  No lesions or rashes. Breasts:  Soft, symmetric.  No skin or nipple changes.  No palpable LN or masses. Lungs:  Clear to auscultation bilaterally.  No wheezes. Cardiovascular:  Regular rate and rhythm.  No murmurs or rubs. Mildly tachycardic Abdomen:  Soft, generally tender, nondistended but obese.  No palpable masses.  No hepatosplenomegaly.  No ascites. Normal bowel sounds.  No hernias.  Incisions are intact, not draining, healing normally  with no signs of cellulitis. The suprapubic incision is also healing well with no cellulitis or drainage. There is some superior echymosis that is fading. Genitourinary: deferred. Extremities: No cyanosis, clubbing or edema.  No calf tenderness or erythema. No palpable cords. Psychiatric: Mood and affect are appropriate. Neurological: Awake, alert and oriented x 3. Sensation is intact, no neuropathy.  Musculoskeletal: No pain, normal strength and range of motion.  Assessment:    48 y.o. 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   S/p robotic hysterectomy, BSO and lymphadenectomy on 09/28/13. Postoperative fevers of unclear origin (negative UA, blood cultures, CT scan).   Plan: Recommend continuing broad spectrum antibiotics until 48 hours afebrile (10/12/13) at which time I would recommend converting to a broad spectrum oral antibiotic (eg Augmentin) for a 7 day course for empiric coverage then discharge with 2 week followup with oncology. Appropriate to continue regular diet. Recommend DVT prophylaxis with SCD's and Lovenox.  Donaciano Eva, MD (cell) [phone]

## 2013-10-11 NOTE — Progress Notes (Signed)
Patient Name: Shannon [name] Date of Encounter: 10/11/2013     Principal Problem:   SIRS (systemic inflammatory response syndrome) Active Problems:   Endometrial cancer, grade I   Morbid obesity   Postoperative fever   Ovarian cancer on left   Anemia, iron deficiency   Tachycardia   Dyspnea    SUBJECTIVE  Feels much better today.  No chest pain or dyspnea.  CURRENT MEDS . carvedilol  3.125 mg Oral BID WC  . docusate sodium  100 mg Oral BID  . enoxaparin (LOVENOX) injection  60 mg Subcutaneous Q24H  . ferrous sulfate  325 mg Oral TID WC  . piperacillin-tazobactam (ZOSYN)  IV  3.375 g Intravenous Q8H  . potassium chloride  40 mEq Oral Once  . vancomycin  1,250 mg Intravenous Q12H    OBJECTIVE  Filed Vitals:   10/10/13 2100 10/10/13 2148 10/11/13 0500 10/11/13 0600  BP: 131/70   120/72  Pulse: 108   102  Temp: 99.1 F (37.3 C)   99.5 F (37.5 C)  TempSrc: Oral   Oral  Resp: 20   18  Height:      Weight:  286 lb 13.1 oz (130.1 kg) 285 lb (129.275 kg)   SpO2: 94%   92%    Intake/Output Summary (Last 24 hours) at 10/11/13 0833 Last data filed at 10/11/13 0345  Gross per 24 hour  Intake   2531 ml  Output   3000 ml  Net   -469 ml   Filed Weights   10/09/13 1015 10/10/13 2148 10/11/13 0500  Weight: 284 lb 13.4 oz (129.2 kg) 286 lb 13.1 oz (130.1 kg) 285 lb (129.275 kg)    PHYSICAL EXAM  General: Pleasant, NAD. Morbid obesity. Neuro: Alert and oriented X 3. Moves all extremities spontaneously. Psych: Normal affect. HEENT:  Normal  Neck: Supple without bruits or JVD. Lungs:  Resp regular and unlabored, CTA. Heart: RRR no s3, s4, or murmurs. Abdomen: Soft, non-tender, non-distended, BS + x 4.  Extremities: No clubbing, cyanosis or edema. DP/PT/Radials 2+ and equal bilaterally.  Accessory Clinical Findings  CBC  Recent Labs  10/08/13 1158 10/08/13 1420  10/10/13 2200 10/11/13 0510  WBC 8.9 7.3  < > 7.2 6.5  NEUTROABS 6.1 5.1  --   --   --     HGB 9.3* 9.0*  < > 10.2* 9.5*  HCT 29.1* 28.2*  < > 31.7* 28.6*  MCV 83.7 83.4  < > 84.8 82.9  PLT 453* 400  < > 327 348  < > = values in this interval not displayed. Basic Metabolic Panel  Recent Labs  10/08/13 1420  10/10/13 0625 10/11/13 0510  NA  --   < > 142 137  K  --   < > 3.7 3.6*  CL  --   < > 107 101  CO2  --   < > 24 22  GLUCOSE  --   < > 103* 96  BUN  --   < > 5* 5*  CREATININE 0.58  < > 0.61 0.56  CALCIUM  --   < > 9.5 9.4  MG 1.7  --   --   --   < > = values in this interval not displayed. Liver Function Tests  Recent Labs  10/08/13 1158  AST 26  ALT 24  ALKPHOS 72  BILITOT 0.46  PROT 7.2  ALBUMIN 3.0*   No results found for this basename: LIPASE, AMYLASE,  in the last  72 hours Cardiac Enzymes  Recent Labs  10/09/13 0857 10/09/13 1405 10/09/13 2004  TROPONINI <0.30 <0.30 <0.30   BNP No components found with this basename: POCBNP,  D-Dimer No results found for this basename: DDIMER,  in the last 72 hours Hemoglobin A1C No results found for this basename: HGBA1C,  in the last 72 hours Fasting Lipid Panel No results found for this basename: CHOL, HDL, LDLCALC, TRIG, CHOLHDL, LDLDIRECT,  in the last 72 hours Thyroid Function Tests  Recent Labs  10/09/13 0857  TSH 2.650    TELE    ECG  Repeat pending  Radiology/Studies  Chest 2 View  09/23/2013   CLINICAL DATA:  Preop for uterine cancer  EXAM: CHEST  2 VIEW  COMPARISON:  None.  FINDINGS: Cardiomediastinal silhouette is unremarkable. No acute infiltrate or pleural effusion. No pulmonary edema. Bony thorax is unremarkable.  IMPRESSION: No active cardiopulmonary disease.   Electronically Signed   By: Lahoma Crocker M.D.   On: 09/23/2013 11:18   Ct Angio Chest Pe W/cm &/or Wo Cm  10/08/2013   CLINICAL DATA:  Shortness of breath and tachycardia. Recent surgery, patient underwent hysterectomy and bilateral salpingo-oophorectomy and lymphadenectomy for stage IA endometrioid endometrial cancer  and synchronous stage IA grade 1 endometrioid ovarian cancer.  EXAM: CT ANGIOGRAPHY CHEST AND CT ABDOMEN AND PELVIS WITH CONTRAST  TECHNIQUE: Multidetector CT imaging of the chest, abdomen and pelvis was performed using the standard protocol during bolus administration of intravenous contrast. Multiplanar CT image reconstructions and MIPs were obtained to evaluate the vascular anatomy.  CONTRAST:  179mL OMNIPAQUE IOHEXOL 350 MG/ML SOLN  COMPARISON:  Chest radiograph 09/23/2013  FINDINGS: Chest: There is some patient respiratory motion that creates some linear artifacts in the ascending aorta and main pulmonary artery. This is a technically satisfactory examination of the pulmonary arterial tree. No focal filling defects are identified to suggest pulmonary embolism.  Small hiatal hernia.  The thoracic aorta is normal in caliber. Negative for aortic dissection.  Negative for lymphadenopathy in the chest. There is mild cardiomegaly. There may be a trace pericardial effusion versus pericardial thickening inferiorly.  There is a trace right pleural effusion.  Lung windows demonstrate moderate patchy areas of atelectasis in both lower lobes, left greater than right, and in the left upper lobe. No definite consolidation.  No acute bony abnormality.  Abdomen/pelvis: Status postcholecystectomy. The liver is normal in size and enhancement. No liver mass or biliary ductal dilatation. The spleen is within normal limits for size. Nonspecific 6 mm low-density lesion in the spleen is noted on image 16. No prior studies for comparison.  The pancreas, adrenal glands, and kidneys are within normal limits.  Stomach is not very distended. Small bowel loops are normal in caliber and wall thickness. The colon is normal in caliber and wall thickness. Patient is status post hysterectomy. No adnexal mass.  There is a moderate amount of free fluid in the pelvis. This measures simple water density. No evidence of pelvic abscess. Urinary  bladder appears within normal limits.  There is a small amount of perihepatic ascites.  Mild subcutaneous stranding in the anterior abdominal wall, without fluid collections.  Abdominal aorta is normal in caliber. Negative for lymphadenopathy in the abdomen or pelvis. Negative for free intraperitoneal air.  No acute or suspicious bony abnormality. Facet joint degenerative changes of the lumbar spine noted.  Review of the MIP images confirms the above findings.  IMPRESSION: 1. No evidence of pulmonary embolism. 2. Moderate patchy areas of  atelectasis in both lungs. 3. Trace right pleural effusion and possible trace pericardial effusion. 4. Mild cardiomegaly. 5. No evidence of intra-abdominal or intrapelvic abscess. 6. Small amount of abdominal free fluid and moderate amount pelvic free fluid. 7. Status post hysterectomy.  No adnexal mass or lymphadenopathy.   Electronically Signed   By: Curlene Dolphin M.D.   On: 10/08/2013 18:00   Ct Abdomen Pelvis W Contrast  10/08/2013   CLINICAL DATA:  Shortness of breath and tachycardia. 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  EXAM: CT ANGIOGRAPHY CHEST AND CT ABDOMEN AND PELVIS WITH CONTRAST  TECHNIQUE: Multidetector CT imaging of the chest, abdomen and pelvis was performed using the standard protocol during bolus administration of intravenous contrast. Multiplanar CT image reconstructions and MIPs were obtained to evaluate the vascular anatomy.  CONTRAST:  159mL OMNIPAQUE IOHEXOL 350 MG/ML SOLN  COMPARISON:  Chest radiograph 09/23/2013  FINDINGS: Chest: There is some patient respiratory motion that creates some linear artifacts in the ascending aorta and main pulmonary artery. This is a technically satisfactory examination of the pulmonary arterial tree. No focal filling defects are identified to suggest  pulmonary embolism.  Small hiatal hernia.  The thoracic aorta is normal in caliber. Negative for aortic dissection.  Negative for lymphadenopathy in the chest. There is mild cardiomegaly. There may be a trace pericardial effusion versus pericardial thickening inferiorly.  There is a trace right pleural effusion.  Lung windows demonstrate moderate patchy areas of atelectasis in both lower lobes, left greater than right, and in the left upper lobe. No definite consolidation.  No acute bony abnormality.  Abdomen/pelvis: Status postcholecystectomy. The liver is normal in size and enhancement. No liver mass or biliary ductal dilatation. The spleen is within normal limits for size. Nonspecific 6 mm low-density lesion in the spleen is noted on image 16. No prior studies for comparison.  The pancreas, adrenal glands, and kidneys are within normal limits.  Stomach is not very distended. Small bowel loops are normal in caliber and wall thickness. The colon is normal in caliber and wall thickness. Patient is status post hysterectomy. No adnexal mass.  There is a moderate amount of free fluid in the pelvis. This measures simple water density. No evidence of pelvic abscess. Urinary bladder appears within normal limits.  There is a small amount of perihepatic ascites.  Mild subcutaneous stranding in the anterior abdominal wall, without fluid collections.  Abdominal aorta is normal in caliber. Negative for lymphadenopathy in the abdomen or pelvis. Negative for free intraperitoneal air.  No acute or suspicious bony abnormality. Facet joint degenerative changes of the lumbar spine noted.  Review of the MIP images confirms the above findings.  IMPRESSION: 1. No evidence of pulmonary embolism. 2. Moderate patchy areas of atelectasis in both lungs. 3. Trace right pleural effusion and possible trace pericardial effusion. 4. Mild cardiomegaly. 5. No evidence of intra-abdominal or intrapelvic abscess. 6. Small amount of abdominal free  fluid and moderate amount pelvic free fluid. 7. Status post hysterectomy.  No adnexal mass or lymphadenopathy.   Electronically Signed   By: Curlene Dolphin M.D.   On: 10/08/2013 18:00    ASSESSMENT AND PLAN 1. Sinus tachycardia multifactoria.--infection, anemia. 2. LVH by echo. 3. Abnormal EKG  Plan: repeat EKG today. Titrate carvedilol to 6.25 BID Would also recommend Cardiology f/u and probably a formal stress test when the current acute illness resolves.  Signed, Darlin Coco MD

## 2013-10-11 NOTE — Progress Notes (Signed)
[facility] PROGRESS NOTE  Shannon [name] HGD:[id number] DOB: 04/08/1965 DOA: 10/08/2013 PCP: Odette Fraction, MD  Assessment/Plan: #1 systemic inflammatory response syndrome Unknown etiology. CT of the chest abdomen and pelvis negative for any acute abnormalities or abscess formation. Urinalysis is negative. Patient still with fevers however trending down. Patient with normal white count. Blood cultures are pending. D/C IV Vancomycin. Continue IV Zosyn. If remains afebrile through tomorrow will transition to oral antibiotics. Follow.  #2 tachycardia/abnormal EKG/LVH Unknown etiology. Maybe secondary to pain versus #1, pain and probably an anemia. TSH within normal limits at 2.650. Cardiac enzymes negative x2. EKG with sinus tachycardia and ST-T wave abnormalities in these 23 aVF V3 through V6. More pronounced than on prior EKGs. 2-D echo with EF of 65-70% with no wall motion abnormalities. Mildly dilated left atrium. Mild left ventricular concentric hypertrophy. Patient s/p  2 units packed red blood cells as tachycardia may be secondary to anemia. Patient has been seen by cardiology, and to patient's tachycardia my likely be secondary to acute illness with fever associated vasodilatation and anemia. EKG changes secondary to LVH, voltage mass by obesity and the ST depression likely secondary to tachycardia. Beta blocker dose has been increased by cardiology. Cardiology recommended outpatient followup for probable formal stress test as outpatient. Lifestyle modifications.. Follow.   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  #4 postop fever Unknown etiology. Urinalysis negative. CT chest negative. CT abdomen and pelvis negative for any acute abs abnormalities. Blood cultures are pending. Fever curve trending down. Continue empiric IV Zosyn. D/C  IV Vamcomycin. If afebrile x 48 hours will transition to oral antibiotics to finish course.  #5 anemia/severe iron deficiency anemia Anemia panel consistent with iron deficiency anemia with iron level of less than 10 and also likely with a dilutional component. Patient with no overt bleeding. Hemoglobin currently at 9.5 from 7.9. Patient is status post IV iron and 2 units PRBCs. Patient currently on oral iron supplementation. Follow H&H.   #6 morbid obesity  #7 prophylaxis Lovenox for DVT prophylaxis.  Code Status: Full Family Communication: Updated patient and sister at the bedside. Disposition Plan: Home when medically stable, hopefully tomorrow if afebrile.   Consultants:  GYN/oncology: Dr. Denman George 10/08/2013  Cardiology: Dr. Sallyanne Kuster 10/09/2013  Procedures:  CT chest, abdomen, pelvis 10/08/2013  2 units packed red blood cells pending  2-D echo 10/09/2013  Antibiotics:  IV Vancomycin 10/08/13>>>>>10/11/13  IV Zosyn 10/08/2013  HPI/Subjective: Patient states feeling better. Abdominal pain has improved per patient.  Objective: Filed Vitals:   10/11/13 1311  BP: 147/97  Pulse: 101  Temp: 98.9 F (37.2 C)  Resp: 18    Intake/Output Summary (Last 24 hours) at 10/11/13 1342 Last data filed at 10/11/13 0345  Gross per 24 hour  Intake 1893.5 ml  Output   2550 ml  Net -656.5 ml   Filed Weights   10/09/13 1015 10/10/13 2148 10/11/13 0500  Weight: 129.2 kg (284 lb 13.4 oz) 130.1 kg (286 lb 13.1 oz) 129.275 kg (285 lb)    Exam:   General:  nad  Cardiovascular: rrr  Respiratory: ctab  Abdomen: soft/nt/nd/+bs  Musculoskeletal: No c/c/e  Data Reviewed: Basic Metabolic Panel:  Recent Labs Lab 10/08/13 1158 10/08/13 1420 10/09/13 0535 10/10/13 0625 10/11/13 0510  NA 139  --  140 142 137  K 4.2  --  4.0 3.7 3.6*  CL  --   --  104 107 101  CO2 25  --  23 24 22   GLUCOSE 99  --  103* 103* 96  BUN 8.6  --  6 5* 5*  CREATININE 0.7 0.58 0.65 0.61 0.56   CALCIUM 10.0  --  9.3 9.5 9.4  MG  --  1.7  --   --   --    Liver Function Tests:  Recent Labs Lab 10/08/13 1158  AST 26  ALT 24  ALKPHOS 72  BILITOT 0.46  PROT 7.2  ALBUMIN 3.0*   No results found for this basename: LIPASE, AMYLASE,  in the last 168 hours No results found for this basename: AMMONIA,  in the last 168 hours CBC:  Recent Labs Lab 10/08/13 1158 10/08/13 1420 10/09/13 0535 10/10/13 0625 10/10/13 2200 10/11/13 0510  WBC 8.9 7.3 7.3 5.5 7.2 6.5  NEUTROABS 6.1 5.1  --   --   --   --   HGB 9.3* 9.0* 8.0* 7.9* 10.2* 9.5*  HCT 29.1* 28.2* 25.6* 24.5* 31.7* 28.6*  MCV 83.7 83.4 83.9 83.9 84.8 82.9  PLT 453* 400 349 305 327 348   Cardiac Enzymes:  Recent Labs Lab 10/09/13 0857 10/09/13 1405 10/09/13 2004  TROPONINI <0.30 <0.30 <0.30   BNP (last 3 results) No results found for this basename: PROBNP,  in the last 8760 hours CBG: No results found for this basename: GLUCAP,  in the last 168 hours  Recent Results (from the past 240 hour(s))  URINE CULTURE     Status: None   Collection Time    10/08/13 12:27 PM      Result Value Ref Range Status   Urine Culture, Routine Culture, Urine   Final   Comment: Final - ===== COLONY COUNT: =====     45,000 COLONIES/ML     Multiple bacterial morphotypes present, none     predominant. Suggest appropriate recollection if      clinically indicated.  CULTURE, BLOOD (ROUTINE X 2)     Status: None   Collection Time    10/08/13  2:06 PM      Result Value Ref Range Status   Specimen Description BLOOD RIGHT ARM   Final   Special Requests BOTTLES DRAWN AEROBIC AND ANAEROBIC  10 CC   Final   Culture  Setup Time     Final   Value: 10/08/2013 17:25     Performed at [facility]   Culture     Final   Value:        BLOOD CULTURE RECEIVED NO GROWTH TO DATE CULTURE WILL BE HELD FOR 5 DAYS BEFORE ISSUING A FINAL NEGATIVE REPORT     Performed at [facility]   Report Status PENDING   Incomplete  CULTURE,  BLOOD (ROUTINE X 2)     Status: None   Collection Time    10/08/13  2:11 PM      Result Value Ref Range Status   Specimen Description BLOOD LEFT ARM   Final   Special Requests BOTTLES DRAWN AEROBIC ONLY  3CC   Final   Culture  Setup Time     Final   Value: 10/08/2013 17:24     Performed at [facility]   Culture     Final   Value:        BLOOD CULTURE RECEIVED NO GROWTH TO DATE CULTURE WILL BE HELD FOR 5 DAYS BEFORE ISSUING A FINAL NEGATIVE REPORT     Performed at [facility]  Report Status PENDING   Incomplete     Studies: No results found.  Scheduled Meds: . carvedilol  6.25 mg Oral BID WC  . docusate sodium  100 mg Oral BID  . enoxaparin (LOVENOX) injection  60 mg Subcutaneous Q24H  . ferrous sulfate  325 mg Oral TID WC  . piperacillin-tazobactam (ZOSYN)  IV  3.375 g Intravenous Q8H  . vancomycin  1,250 mg Intravenous Q12H   Continuous Infusions:    Principal Problem:   SIRS (systemic inflammatory response syndrome) Active Problems:   Postoperative fever   Endometrial cancer, grade I   Morbid obesity   Ovarian cancer on left   Anemia, iron deficiency   Tachycardia   Dyspnea    Time spent: [place] MD [facility] Pager [phone]. If 7PM-7AM, please contact night-coverage at [email or web address], password [facility] 10/11/2013, 1:42 PM  LOS: 3 days

## 2013-10-11 NOTE — Progress Notes (Signed)
ANTIBIOTIC CONSULT NOTE - Follow-Up  Pharmacy Consult for vancomycin, Zosyn Indication: rule out sepsis  Allergies  Allergen Reactions  . Contrast Media [Iodinated Diagnostic Agents] Anaphylaxis    Swelling of the throat, difficulty breathing  . Tetanus Toxoids     Swelling at site, lethargic x few days    Patient Measurements: Height: 5\' 2"  (157.5 cm) Weight: 285 lb (129.275 kg) IBW/kg (Calculated) : 50.1  Vital Signs: Temp: 99.5 F (37.5 C) (09/14 0600) Temp src: Oral (09/14 0600) BP: 120/72 mmHg (09/14 0600) Pulse Rate: 102 (09/14 0600) Intake/Output from previous day: 09/13 0701 - 09/14 0700 In: 2531 [P.O.:840; I.V.:360; Blood:706; IV Piggyback:625] Out: 3000 [WGYKZ:9935] Intake/Output from this shift:    Labs:  Recent Labs  10/09/13 0535 10/10/13 0625 10/10/13 2200 10/11/13 0510  WBC 7.3 5.5 7.2 6.5  HGB 8.0* 7.9* 10.2* 9.5*  PLT 349 305 327 348  CREATININE 0.65 0.61  --  0.56   Estimated Creatinine Clearance: 114.7 ml/min (by C-G formula based on Cr of 0.56). No results found for this basename: VANCOTROUGH, Corlis Leak, VANCORANDOM, [place], [place], [place], TOBRATROUGH, TOBRAPEAK, TOBRARND, AMIKACINPEAK, AMIKACINTROU, AMIKACIN,  in the last 72 hours   Microbiology: Recent Results (from the past 720 hour(s))  URINE CULTURE     Status: None   Collection Time    10/08/13 12:27 PM      Result Value Ref Range Status   Urine Culture, Routine Culture, Urine   Final   Comment: Final - ===== COLONY COUNT: =====     45,000 COLONIES/ML     Multiple bacterial morphotypes present, none     predominant. Suggest appropriate recollection if      clinically indicated.  CULTURE, BLOOD (ROUTINE X 2)     Status: None   Collection Time    10/08/13  2:06 PM      Result Value Ref Range Status   Specimen Description BLOOD RIGHT ARM   Final   Special Requests BOTTLES DRAWN AEROBIC AND ANAEROBIC  10 CC   Final   Culture  Setup Time     Final   Value: 10/08/2013  17:25     Performed at [facility]   Culture     Final   Value:        BLOOD CULTURE RECEIVED NO GROWTH TO DATE CULTURE WILL BE HELD FOR 5 DAYS BEFORE ISSUING A FINAL NEGATIVE REPORT     Performed at [facility]   Report Status PENDING   Incomplete  CULTURE, BLOOD (ROUTINE X 2)     Status: None   Collection Time    10/08/13  2:11 PM      Result Value Ref Range Status   Specimen Description BLOOD LEFT ARM   Final   Special Requests BOTTLES DRAWN AEROBIC ONLY  3CC   Final   Culture  Setup Time     Final   Value: 10/08/2013 17:24     Performed at [facility]   Culture     Final   Value:        BLOOD CULTURE RECEIVED NO GROWTH TO DATE CULTURE WILL BE HELD FOR 5 DAYS BEFORE ISSUING A FINAL NEGATIVE REPORT     Performed at [facility]   Report Status PENDING   Incomplete    Medical History: Past Medical History  Diagnosis Date  . PONV (postoperative nausea and vomiting)   . Hypertension     not taking med x1 yr. couldn't afford.  Marland Kitchen UTI (  lower urinary tract infection)     history of 1 month ago  . Cancer     Uterine cancer dx. surgery planned TAH  . Uterine cancer   . Ovarian cancer     Medications:  Scheduled:  . carvedilol  6.25 mg Oral BID WC  . docusate sodium  100 mg Oral BID  . enoxaparin (LOVENOX) injection  60 mg Subcutaneous Q24H  . ferrous sulfate  325 mg Oral TID WC  . piperacillin-tazobactam (ZOSYN)  IV  3.375 g Intravenous Q8H  . vancomycin  1,250 mg Intravenous Q12H   Infusions:    Assessment: 48 yo female s/p robotic hysterectomy, BSO and lymphadenectomy 9/1 found to have stage IA Grade 1 endometrioid endometrial cancer and synchronous stage IA grade 1 endometroid ovarian cancer, subsequently presented to ED with fevers of undetermined origin.  CT of abdomen/pelvis and chest showed no definitive evidence of infection and no pulmonary embolism.  Empiric vancomycin and Zosyn were started and pharmacy dosing assistance was  requested.   Goal of Therapy:  Vancomycin trough level 15-20 mcg/ml Appropriate antibiotic dosing for renal function; eradication of infection.   9/14:   D#4 Vancomycin 2500 mg IV x 1, then 1250 mg IV q12h  D#4 Zosyn 3.375 grams IV q8h (extended-infusion)  Tmax improved from 103.1 on 9/11 to 100.4 on 9/13.  WBC has remained WNL since admission  SCr remains WNL, stable.   Estimated normalized creatinine clearance ~ 100 mL/min/72kg  Blood cultures x 2 from 9/11 still no growth to date  Per GYN oncology notes, after patient remains afebrile x 48 hours will de-escalate to Augmentin.   Plan:  1. Vancomycin trough today at 3pm, adjust dosage if necessary 2. Continue Zosyn 3.375 grams IV q8h (extended-infusion) 3. Follow temp curve, serum creatinine, clinical course.  Clayburn Pert, PharmD, BCPS Pager: [phone] 10/11/2013  10:44 AM

## 2013-10-12 LAB — CBC
HEMATOCRIT: 29.4 % — AB (ref 36.0–46.0)
HEMOGLOBIN: 9.5 g/dL — AB (ref 12.0–15.0)
MCH: 27.4 pg (ref 26.0–34.0)
MCHC: 32.3 g/dL (ref 30.0–36.0)
MCV: 84.7 fL (ref 78.0–100.0)
Platelets: 353 10*3/uL (ref 150–400)
RBC: 3.47 MIL/uL — ABNORMAL LOW (ref 3.87–5.11)
RDW: 13.6 % (ref 11.5–15.5)
WBC: 6 10*3/uL (ref 4.0–10.5)

## 2013-10-12 LAB — BASIC METABOLIC PANEL
Anion gap: 12 (ref 5–15)
BUN: 7 mg/dL (ref 6–23)
CHLORIDE: 105 meq/L (ref 96–112)
CO2: 24 mEq/L (ref 19–32)
Calcium: 9.8 mg/dL (ref 8.4–10.5)
Creatinine, Ser: 0.65 mg/dL (ref 0.50–1.10)
GFR calc non Af Amer: >90 mL/min (ref >90)
Glucose, Bld: 96 mg/dL (ref 70–99)
POTASSIUM: 3.8 meq/L (ref 3.7–5.3)
Sodium: 141 mEq/L (ref 137–147)

## 2013-10-12 MED ORDER — CARVEDILOL 6.25 MG PO TABS: 6.2500 mg | ORAL_TABLET | Freq: Two times a day (BID) | ORAL | Status: DC

## 2013-10-12 MED ORDER — FERROUS SULFATE 325 (65 FE) MG PO TABS: 325.0000 mg | ORAL_TABLET | Freq: Three times a day (TID) | ORAL | Status: DC

## 2013-10-12 MED ORDER — AMOXICILLIN-POT CLAVULANATE 875-125 MG PO TABS: 1.0000 | ORAL_TABLET | Freq: Two times a day (BID) | ORAL | Status: AC

## 2013-10-12 MED ORDER — AMOXICILLIN-POT CLAVULANATE 875-125 MG PO TABS
1.0000 | ORAL_TABLET | Freq: Two times a day (BID) | ORAL | Status: DC
  Administered 2013-10-12: 1 via ORAL
  Filled 2013-10-12 (×2): qty 1

## 2013-10-12 NOTE — Progress Notes (Signed)
Gynecologic Oncology  Subjective: Patient reports doing well this am.  Tolerating solid food with no nausea or emesis.  Denies chest pain, dyspnea, fever, chills, abdominal pain, vaginal bleeding, incisional drainage.  Reporting loose stools.  Voiding without difficult.  Ambulating without difficulty.  No concerns voiced.  Constitutional: Feels well.  No weakness, fever, chills, fatigue.  Cardiovascular: No chest pain, shortness of breath, or edema.  Pulmonary: Mild cough for the past three days with no sputum production.  No wheeze.  Gastrointestinal: Loose stools intermittently.  No nausea or vomiting. No bright red blood per rectum. Genitourinary: No frequency, urgency, or dysuria. No vaginal bleeding or discharge.  Musculoskeletal: No myalgia or joint pain. Neurologic: No weakness, numbness, or change in gait.  Psychology: No depression, anxiety, or insomnia.  Objective: Vital signs in last 24 hours: Temp:  [98.4 F (36.9 C)-99.2 F (37.3 C)] 98.4 F (36.9 C) (09/15 0511) Pulse Rate:  [101-109] 102 (09/15 0511) Resp:  [16-18] 16 (09/15 0511) BP: (125-147)/(73-97) 125/73 mmHg (09/15 0511) SpO2:  [95 %-98 %] 95 % (09/15 0511) Weight:  [286 lb (129.729 kg)] 286 lb (129.729 kg) (09/15 0511) Last BM Date: 10/11/13  Intake/Output from previous day: 09/14 0701 - 09/15 0700 In: 1490.2 [P.O.:420; I.V.:420.2; IV Piggyback:650] Out: 7124 [Urine:1125]  Physical Examination: General: alert, cooperative and no distress Resp: clear to auscultation bilaterally Cardio: regular rate and rhythm, S1, S2 normal, no murmur, click, rub or gallop GI: soft, non-tender; bowel sounds normal; no masses,  no organomegaly, incision: Lap sites with dermabond without drainage, mild erythema noted around the incision above the umbilicus, low transverse incision healing with no drainage or erythema present and abdomen obese Extremities: extremities normal, atraumatic, no cyanosis or  edema  Labs: WBC/Hgb/Hct/Plts:  6.0/9.5/29.4/353 (09/15 0500) BUN/Cr/glu/ALT/AST/amyl/lip:  7/0.65/--/--/--/--/-- (09/15 0500)  CT:  CT of the chest, abdomen and pelvis on 10/08/13 revealed: 1. No evidence of pulmonary embolism. 2. Moderate patchy areas of atelectasis in both lungs. 3. Trace right pleural effusion and possible trace pericardial effusion. 4. Mild cardiomegaly. 5. No evidence of intra-abdominal or intrapelvic abscess. 6. Small amount of abdominal free fluid and moderate amount pelvic free fluid. 7. Status post hysterectomy. No adnexal mass or lymphadenopathy   Assessment: 48 y.o. s/p : Robotic hysterectomy, BSO and lymphadenectomy on 06/05/07 without complications. Her postoperative course was uncomplicated and she was discharged on postoperative day 1. Final pathology revealed a Stage IA Grade 1 endometrioid endometrial cancer with no lymphovascular space invasion, no myometrial invasion and negative lymph nodes. 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  Currently admitted to the hospital with SIRS and work up for postoperative fevers.    Principal Problem:   SIRS (systemic inflammatory response syndrome)  Active Problems:   Endometrial cancer, grade I   Morbid obesity   Postoperative fever   Ovarian cancer on left   Anemia, iron deficiency   Tachycardia   Dyspnea  Pain:  Pain is well-controlled.  PRN medications ordered.  Heme:  Hgb 9.5 and Hct 29.4 this am.  ID:  Afebrile currently.  Started on Augmentin BID.  CV: BP and HR stable.  Cardiology consulting.  2D echo ordered.  Current treatment:  Carvedilol 6.25 BID.  GI:  Tolerating po: Yes     FEN: Stable    BMET    Component Value Date/Time   NA 141 10/12/2013 0500   NA 139  10/08/2013 1158   K 3.8 10/12/2013 0500   K 4.2 10/08/2013 1158   CL 105 10/12/2013 0500   CO2 24  10/12/2013 0500   CO2 25 10/08/2013 1158   GLUCOSE 96 10/12/2013 0500   GLUCOSE 99 10/08/2013 1158   BUN 7 10/12/2013 0500   BUN 8.6 10/08/2013 1158   CREATININE 0.65 10/12/2013 0500   CREATININE 0.7 10/08/2013 1158   CALCIUM 9.8 10/12/2013 0500   CALCIUM 10.0 10/08/2013 1158   GFRNONAA >90 10/12/2013 0500   GFRAA >90 10/12/2013 0500    Prophylaxis: Lovenox Q24H.  Plan: Continue plan of care per [facility] Follow up appointment arranged for 10/25/13 with Dr. Denman George Encourage ambulation, IS use, deep breathing, and coughing    LOS: 4 days    CROSS, MELISSA DEAL 10/12/2013, 10:24 AM

## 2013-10-12 NOTE — Progress Notes (Signed)
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 No dyspnea or chest discomfort.  CURRENT MEDS . amoxicillin-clavulanate  1 tablet Oral Q12H  . carvedilol  6.25 mg Oral BID WC  . docusate sodium  100 mg Oral BID  . enoxaparin (LOVENOX) injection  60 mg Subcutaneous Q24H  . ferrous sulfate  325 mg Oral TID WC    OBJECTIVE  Filed Vitals:   10/11/13 0600 10/11/13 1311 10/11/13 2204 10/12/13 0511  BP: 120/72 147/97 133/81 125/73  Pulse: 102 101 109 102  Temp: 99.5 F (37.5 C) 98.9 F (37.2 C) 99.2 F (37.3 C) 98.4 F (36.9 C)  TempSrc: Oral Oral Oral Oral  Resp: 18 18 18 16   Height:      Weight:    286 lb (129.729 kg)  SpO2: 92% 98% 97% 95%    Intake/Output Summary (Last 24 hours) at 10/12/13 0825 Last data filed at 10/12/13 0511  Gross per 24 hour  Intake 1490.17 ml  Output   1125 ml  Net 365.17 ml   Filed Weights   10/10/13 2148 10/11/13 0500 10/12/13 0511  Weight: 286 lb 13.1 oz (130.1 kg) 285 lb (129.275 kg) 286 lb (129.729 kg)    PHYSICAL EXAM  General: Pleasant, NAD. Obesity. Neuro: Alert and oriented X 3. Moves all extremities spontaneously. Psych: Normal affect. HEENT:  Normal  Neck: Supple without bruits or JVD. Lungs:  Resp regular and unlabored, CTA. Heart: RRR no s3, s4, or murmurs. Abdomen: Soft, non-tender, non-distended, BS + x 4.  Extremities: No clubbing, cyanosis or edema. DP/PT/Radials 2+ and equal bilaterally.  Accessory Clinical Findings  CBC  Recent Labs  10/11/13 0510 10/12/13 0500  WBC 6.5 6.0  HGB 9.5* 9.5*  HCT 28.6* 29.4*  MCV 82.9 84.7  PLT 348 938   Basic Metabolic Panel  Recent Labs  10/11/13 0510 10/12/13 0500  NA 137 141  K 3.6*  3.8  CL 101 105  CO2 22 24  GLUCOSE 96 96  BUN 5* 7  CREATININE 0.56 0.65  CALCIUM 9.4 9.8   Liver Function Tests No results found for this basename: AST, ALT, ALKPHOS, BILITOT, PROT, ALBUMIN,  in the last 72 hours No results found for this basename: LIPASE, AMYLASE,  in the last 72 hours Cardiac Enzymes  Recent Labs  10/09/13 0857 10/09/13 1405 10/09/13 2004  TROPONINI <0.30 <0.30 <0.30   BNP No components found with this basename: POCBNP,  D-Dimer No results found for this basename: DDIMER,  in the last 72 hours Hemoglobin A1C No results found for this basename: HGBA1C,  in the last 72 hours Fasting Lipid Panel No results found for this basename: CHOL, HDL, LDLCALC, TRIG, CHOLHDL, LDLDIRECT,  in the last 72 hours Thyroid Function Tests  Recent Labs  10/09/13 0857  TSH 2.650    TELE    ECG  ST changes are improved from prior tracings.  Radiology/Studies  Chest 2 View  09/23/2013   CLINICAL DATA:  Preop for uterine cancer  EXAM: CHEST  2 VIEW  COMPARISON:  None.  FINDINGS: Cardiomediastinal silhouette is unremarkable. No acute infiltrate or pleural effusion. No pulmonary edema. Bony thorax is unremarkable.  IMPRESSION: No active  cardiopulmonary disease.   Electronically Signed   By: Lahoma Crocker M.D.   On: 09/23/2013 11:18   Ct Angio Chest Pe W/cm &/or Wo Cm  10/08/2013   CLINICAL DATA:  Shortness of breath and tachycardia. 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  EXAM: CT ANGIOGRAPHY CHEST AND CT ABDOMEN AND PELVIS WITH CONTRAST  TECHNIQUE: Multidetector CT imaging of the chest, abdomen and pelvis was performed using the standard protocol during bolus administration of intravenous contrast. Multiplanar CT image reconstructions and MIPs were obtained to evaluate the vascular anatomy.  CONTRAST:  134mL  OMNIPAQUE IOHEXOL 350 MG/ML SOLN  COMPARISON:  Chest radiograph 09/23/2013  FINDINGS: Chest: There is some patient respiratory motion that creates some linear artifacts in the ascending aorta and main pulmonary artery. This is a technically satisfactory examination of the pulmonary arterial tree. No focal filling defects are identified to suggest pulmonary embolism.  Small hiatal hernia.  The thoracic aorta is normal in caliber. Negative for aortic dissection.  Negative for lymphadenopathy in the chest. There is mild cardiomegaly. There may be a trace pericardial effusion versus pericardial thickening inferiorly.  There is a trace right pleural effusion.  Lung windows demonstrate moderate patchy areas of atelectasis in both lower lobes, left greater than right, and in the left upper lobe. No definite consolidation.  No acute bony abnormality.  Abdomen/pelvis: Status postcholecystectomy. The liver is normal in size and enhancement. No liver mass or biliary ductal dilatation. The spleen is within normal limits for size. Nonspecific 6 mm low-density lesion in the spleen is noted on image 16. No prior studies for comparison.  The pancreas, adrenal glands, and kidneys are within normal limits.  Stomach is not very distended. Small bowel loops are normal in caliber and wall thickness. The colon is normal in caliber and wall thickness. Patient is status post hysterectomy. No adnexal mass.  There is a moderate amount of free fluid in the pelvis. This measures simple water density. No evidence of pelvic abscess. Urinary bladder appears within normal limits.  There is a small amount of perihepatic ascites.  Mild subcutaneous stranding in the anterior abdominal wall, without fluid collections.  Abdominal aorta is normal in caliber. Negative for lymphadenopathy in the abdomen or pelvis. Negative for free intraperitoneal air.  No acute or suspicious bony abnormality. Facet joint degenerative changes of the lumbar spine noted.   Review of the MIP images confirms the above findings.  IMPRESSION: 1. No evidence of pulmonary embolism. 2. Moderate patchy areas of atelectasis in both lungs. 3. Trace right pleural effusion and possible trace pericardial effusion. 4. Mild cardiomegaly. 5. No evidence of intra-abdominal or intrapelvic abscess. 6. Small amount of abdominal free fluid and moderate amount pelvic free fluid. 7. Status post hysterectomy.  No adnexal mass or lymphadenopathy.   Electronically Signed   By: Curlene Dolphin M.D.   On: 10/08/2013 18:00   Ct Abdomen Pelvis W Contrast  10/08/2013   CLINICAL DATA:  Shortness of breath and tachycardia. 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  EXAM: CT ANGIOGRAPHY CHEST AND CT ABDOMEN AND PELVIS WITH CONTRAST  TECHNIQUE: Multidetector CT imaging of the chest, abdomen and pelvis was performed using the standard protocol during bolus administration of intravenous contrast. Multiplanar CT image reconstructions and MIPs were obtained to evaluate  the vascular anatomy.  CONTRAST:  134mL OMNIPAQUE IOHEXOL 350 MG/ML SOLN  COMPARISON:  Chest radiograph 09/23/2013  FINDINGS: Chest: There is some patient respiratory motion that creates some linear artifacts in the ascending aorta and main pulmonary artery. This is a technically satisfactory examination of the pulmonary arterial tree. No focal filling defects are identified to suggest pulmonary embolism.  Small hiatal hernia.  The thoracic aorta is normal in caliber. Negative for aortic dissection.  Negative for lymphadenopathy in the chest. There is mild cardiomegaly. There may be a trace pericardial effusion versus pericardial thickening inferiorly.  There is a trace right pleural effusion.  Lung windows demonstrate moderate patchy areas of atelectasis in both lower lobes, left greater  than right, and in the left upper lobe. No definite consolidation.  No acute bony abnormality.  Abdomen/pelvis: Status postcholecystectomy. The liver is normal in size and enhancement. No liver mass or biliary ductal dilatation. The spleen is within normal limits for size. Nonspecific 6 mm low-density lesion in the spleen is noted on image 16. No prior studies for comparison.  The pancreas, adrenal glands, and kidneys are within normal limits.  Stomach is not very distended. Small bowel loops are normal in caliber and wall thickness. The colon is normal in caliber and wall thickness. Patient is status post hysterectomy. No adnexal mass.  There is a moderate amount of free fluid in the pelvis. This measures simple water density. No evidence of pelvic abscess. Urinary bladder appears within normal limits.  There is a small amount of perihepatic ascites.  Mild subcutaneous stranding in the anterior abdominal wall, without fluid collections.  Abdominal aorta is normal in caliber. Negative for lymphadenopathy in the abdomen or pelvis. Negative for free intraperitoneal air.  No acute or suspicious bony abnormality. Facet joint degenerative changes of the lumbar spine noted.  Review of the MIP images confirms the above findings.  IMPRESSION: 1. No evidence of pulmonary embolism. 2. Moderate patchy areas of atelectasis in both lungs. 3. Trace right pleural effusion and possible trace pericardial effusion. 4. Mild cardiomegaly. 5. No evidence of intra-abdominal or intrapelvic abscess. 6. Small amount of abdominal free fluid and moderate amount pelvic free fluid. 7. Status post hysterectomy.  No adnexal mass or lymphadenopathy.   Electronically Signed   By: Curlene Dolphin M.D.   On: 10/08/2013 18:00    ASSESSMENT AND PLAN 1. Sinus tachycardia multifactorial.--infection, anemia.  2. LVH by echo.  3. Abnormal EKG  Plan: Continue current carvedilol.  Would also recommend Cardiology f/u and probably a formal stress test  when the current acute illness resolves.  No further inpatient cardiology tests indicated. Will sign off now.   Signed, Darlin Coco MD

## 2013-10-12 NOTE — Care Management Note (Signed)
    Page 1 of 1   10/12/2013     3:40:39 PM CARE MANAGEMENT NOTE 10/12/2013  Patient:  Shannon [name], Shannon [name]   Account Number:  [id number]  Date Initiated:  10/12/2013  Documentation initiated by:  Sunday Spillers  Subjective/Objective Assessment:   48 yo female admitted with postop infection. PTA lived at home.     Action/Plan:   Home when stable   Anticipated DC Date:  10/14/2013   Anticipated DC Plan:  [facility]  CM consult  Medication Assistance      Choice offered to / List presented to:             Status of service:  Completed, signed off Medicare Important Message given?   (If response is "NO", the following Medicare IM given date fields will be blank) Date Medicare IM given:   Medicare IM given by:   Date Additional Medicare IM given:   Additional Medicare IM given by:    Discharge Disposition:  HOME/SELF CARE  Per UR Regulation:  Reviewed for med. necessity/level of care/duration of stay  If discussed at [place] of Stay Meetings, dates discussed:    Comments:  10-12-13 Sunday Spillers RN CM 1500 Recieved a referral for medication assistance. Spoke with patient at bedside, states she has medication coverage with insurance provider. Discussed options for lowering cost of medication (generic, manufacturer assistance, $4 list), provided patient with a [employer] $4 list, encouraged her to discuss medication alternatives with her PCP. Patient appreciative of assistance. No other needs identified.

## 2013-10-12 NOTE — Discharge Summary (Signed)
Physician Discharge Summary  Shannon [name]:[id number] DOB: 1965/02/15 DOA: 10/08/2013  PCP: Odette Fraction, MD  Admit date: 10/08/2013 Discharge date: 10/12/2013  Time spent: 65 minutes  Recommendations for Outpatient Follow-up:  1. Followup with Dr. Denman George of GYN oncology on 10/25/2013. 2. Followup with Dr. Sallyanne Kuster in 2 weeks for further evaluation and probable outpatient stress test. 3. Followup with [facility] TOM, MD in 1 week. On followup patient needs a CBC to followup on H&H. Patient needs basic metabolic profile done to followup on electrolytes and renal function. Patient's blood pressure also needs to be reassessed at that visit. Patient was noted to have a iron deficiency anemia and this will need to be followed up upon.  Discharge Diagnoses:  Principal Problem:   SIRS (systemic inflammatory response syndrome) Active Problems:   Postoperative fever   Endometrial cancer, grade I   Morbid obesity   Ovarian cancer on left   Anemia, iron deficiency   Tachycardia   Dyspnea   Discharge Condition: Stable and improved  Diet recommendation: Regular  Filed Weights   10/10/13 2148 10/11/13 0500 10/12/13 0511  Weight: 130.1 kg (286 lb 13.1 oz) 129.275 kg (285 lb) 129.729 kg (286 lb)    History of present illness:  Shannon [name] is a 48 y.o. 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  Patient stated that postoperatively on day of discharge she had a little bit of leakage in the left sided  abdominal incision. Patient had followed up with the nurse practitioner incision was opened slightly with increased leakage which subsequently resolved. Patient also stated that she noted a lower abdominal hematoma around the lower abdominal incision site placed a washcloth on it with some improvement.  Patient endorsed some constipation, dysuria, generalized weakness, flatus. Patient denied any diarrhea, no chest parenchyma no melena, no hematemesis, no hematochezia. Patient stated had last bowel movement one day prior to admission.  Patient was seen on followup by GYN oncologist office and subsequently transferred to the hospital for further workup on the postop fever.  Hospitalist group was called to admit the patient for further evaluation and management.   Hospital Course:  #1 systemic inflammatory response syndrome  Patient was admitted with fever and tachycardia. Unknown etiology. CT of the chest abdomen and pelvis negative for any acute abnormalities or abscess formation. Urinalysis is negative. Patient had blood cultures drawn with no growth to date. Patient with normal white count. Patient was placed empirically on IV vancomycin and IV Zosyn. Patient's fever curve trended down. Patient improved clinically. IV vancomycin was subsequently discontinued and patient maintained on IV Zosyn. IV Zosyn was subsequently transitioned to oral Augmentin which patient tolerated. Patient was discharged home on 5 more days of oral Augmentin to complete a course of antibiotic therapy. Patient is to followup with PCP in GYN oncology as outpatient. #2 tachycardia/abnormal EKG/LVH  Unknown etiology. Maybe secondary to pain versus #1, and probably symptomatic anemia. TSH within normal limits at 2.650. Cardiac enzymes negative x2. EKG with sinus tachycardia and ST-T wave abnormalities in leads 2,3 aVF V3 through V6. More pronounced than on prior EKGs. 2-D echo with  EF of 65-70% with no wall motion abnormalities.  Mildly dilated left atrium. Mild left ventricular concentric hypertrophy. Cardiology consult was subsequently obtained. Patient was transfused 2 units packed red blood cells as tachycardia may be secondary to anemia. Patient has been seen by cardiology, and  patient's tachycardia may likely be secondary to acute illness with fever associated vasodilatation and anemia. EKG changes secondary to LVH, voltage mass by obesity and the ST depression likely secondary to tachycardia. Patient was started on a beta blocker and dose was adjusted per cardiology. Cardiology recommended outpatient followup for probable formal stress test as outpatient. Lifestyle modifications.. Follow.  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 Oncology was consulted and followed the patient throughout the hospitalization. #4 postop fever  Unknown etiology. Patient was admitted with postoperative fever. Patient was pan cultured. Urinalysis negative. CT chest negative. CT abdomen and pelvis negative for any acute abs abnormalities. Blood cultures were also obtained with no growth to date by day of discharge. Patient's fever curve trended down. Patient was initially placed empirically on IV vancomycin IV Zosyn. Patient improved clinically. IV vancomycin was subsequently discontinued. Patient remained afebrile for 48 hours prior to discharge. Patient be discharged home on Augmentin twice daily for 5 more days to complete a 8 day course of antibiotic therapy. Patient is to followup with PCP and GYN oncology as outpatient.  #5 anemia/severe iron deficiency anemia  Patient was noted to be anemic on admission. Anemia panel consistent with iron deficiency anemia with iron level of less than 10 and also likely with a dilutional component. Patient with no overt bleeding. Patient was given some IV iron. Due to patient's  tachycardia it was felt patient likely did have a symptomatic anemia and patient was subsequently transfused 2 units of packed red blood cells when her hemoglobin was down to 7.9. Patient had appropriate response such that by day of discharge her hemoglobin was 9.5. Patient was also started on oral iron supplementation. Patient will followup with PCP as outpatient.  #6 morbid obesity      Procedures: CT chest, abdomen, pelvis 10/08/2013  2 units packed red blood cells  2-D echo 10/09/2013   Consultations: GYN/oncology: Dr. Denman George 10/08/2013  Cardiology: Dr. Sallyanne Kuster 10/09/2013   Discharge Exam: Filed Vitals:   10/12/13 1702  BP: 147/86  Pulse: 102  Temp:   Resp:     General: NAD Cardiovascular: RRR Respiratory: CTAB  Discharge Instructions You were cared for by a hospitalist during your hospital stay. If you have any questions about your discharge medications or the care you received while you were in the hospital after you are discharged, you can call the unit and asked to speak with the hospitalist on call if the hospitalist that took care of you is not available. Once you are discharged, your primary care physician will handle any further medical issues. Please note that NO REFILLS for any discharge medications will be authorized once you are discharged, as it is imperative that you return to your primary care physician (or establish a relationship with a primary care physician if you do not have one) for your aftercare needs so that they can reassess your need for medications and monitor your lab values.  Discharge Instructions   Diet general    Complete by:  As directed      Discharge instructions    Complete by:  As directed   Follow up  with Dr Denman George as scheduled 10/25/13 Follow up with [facility] TOM, MD in 1 week. Follow up with cardiology, Dr Sallyanne Kuster in 2 weeks     Increase activity slowly    Complete by:  As directed           Current Discharge Medication  List    START taking these medications   Details  amoxicillin-clavulanate (AUGMENTIN) 875-125 MG per tablet Take 1 tablet by mouth every 12 (twelve) hours. Take for 5 days then stop. Qty: 10 tablet, Refills: 0    carvedilol (COREG) 6.25 MG tablet Take 1 tablet (6.25 mg total) by mouth 2 (two) times daily with a meal. Qty: 62 tablet, Refills: 0    ferrous sulfate 325 (65 FE) MG tablet Take 1 tablet (325 mg total) by mouth 3 (three) times daily with meals. Refills: 3      CONTINUE these medications which have NOT CHANGED   Details  acetaminophen (TYLENOL) 500 MG tablet Take 500 mg by mouth every 6 (six) hours as needed (For pain.).    bisacodyl (DULCOLAX) 5 MG EC tablet Take 10 mg by mouth daily as needed for moderate constipation.    docusate sodium (STOOL SOFTENER) 100 MG capsule Take 100 mg by mouth daily as needed for mild constipation.    oxyCODONE-acetaminophen (PERCOCET/ROXICET) 5-325 MG per tablet Take 1-2 tablets by mouth every 4 (four) hours as needed for moderate pain or severe pain (when taking fluids).       Allergies  Allergen Reactions  . Contrast Media [Iodinated Diagnostic Agents] Anaphylaxis    Swelling of the throat, difficulty breathing  . Tetanus Toxoids     Swelling at site, lethargic x few days   Follow-up Information   Follow up with [facility] TOM, MD. Schedule an appointment as soon as possible for a visit in 1 week.   Specialty:  Family Medicine   Contact information:   [address] [place] [address] [phone]       Follow up with CROITORU,MIHAI, MD. Schedule an appointment as soon as possible for a visit in 2 weeks.   Specialty:  Cardiology   Contact information:   [address] [place] [address] [phone]       Follow up with Donaciano Eva, MD On 10/25/2013.   Specialty:  Obstetrics and Gynecology   Contact information:   [place] [address] [phone]        The  results of significant diagnostics from this hospitalization (including imaging, microbiology, ancillary and laboratory) are listed below for reference.    Significant Diagnostic Studies: Chest 2 View  09/23/2013   CLINICAL DATA:  Preop for uterine cancer  EXAM: CHEST  2 VIEW  COMPARISON:  None.  FINDINGS: Cardiomediastinal silhouette is unremarkable. No acute infiltrate or pleural effusion. No pulmonary edema. Bony thorax is unremarkable.  IMPRESSION: No active cardiopulmonary disease.   Electronically Signed   By: Lahoma Crocker M.D.   On: 09/23/2013 11:18   Ct Angio Chest Pe W/cm &/or Wo Cm  10/08/2013   CLINICAL DATA:  Shortness of breath and tachycardia. 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  EXAM: CT ANGIOGRAPHY CHEST AND CT ABDOMEN AND PELVIS WITH CONTRAST  TECHNIQUE: Multidetector CT imaging of the chest, abdomen and pelvis was performed using the standard protocol during bolus administration of intravenous contrast. Multiplanar CT  image reconstructions and MIPs were obtained to evaluate the vascular anatomy.  CONTRAST:  178mL OMNIPAQUE IOHEXOL 350 MG/ML SOLN  COMPARISON:  Chest radiograph 09/23/2013  FINDINGS: Chest: There is some patient respiratory motion that creates some linear artifacts in the ascending aorta and main pulmonary artery. This is a technically satisfactory examination of the pulmonary arterial tree. No focal filling defects are identified to suggest pulmonary embolism.  Small hiatal hernia.  The thoracic aorta is normal in caliber. Negative for aortic dissection.  Negative for lymphadenopathy in the chest. There is mild cardiomegaly. There may be a trace pericardial effusion versus pericardial thickening inferiorly.  There is a trace right pleural effusion.  Lung windows demonstrate moderate patchy areas of  atelectasis in both lower lobes, left greater than right, and in the left upper lobe. No definite consolidation.  No acute bony abnormality.  Abdomen/pelvis: Status postcholecystectomy. The liver is normal in size and enhancement. No liver mass or biliary ductal dilatation. The spleen is within normal limits for size. Nonspecific 6 mm low-density lesion in the spleen is noted on image 16. No prior studies for comparison.  The pancreas, adrenal glands, and kidneys are within normal limits.  Stomach is not very distended. Small bowel loops are normal in caliber and wall thickness. The colon is normal in caliber and wall thickness. Patient is status post hysterectomy. No adnexal mass.  There is a moderate amount of free fluid in the pelvis. This measures simple water density. No evidence of pelvic abscess. Urinary bladder appears within normal limits.  There is a small amount of perihepatic ascites.  Mild subcutaneous stranding in the anterior abdominal wall, without fluid collections.  Abdominal aorta is normal in caliber. Negative for lymphadenopathy in the abdomen or pelvis. Negative for free intraperitoneal air.  No acute or suspicious bony abnormality. Facet joint degenerative changes of the lumbar spine noted.  Review of the MIP images confirms the above findings.  IMPRESSION: 1. No evidence of pulmonary embolism. 2. Moderate patchy areas of atelectasis in both lungs. 3. Trace right pleural effusion and possible trace pericardial effusion. 4. Mild cardiomegaly. 5. No evidence of intra-abdominal or intrapelvic abscess. 6. Small amount of abdominal free fluid and moderate amount pelvic free fluid. 7. Status post hysterectomy.  No adnexal mass or lymphadenopathy.   Electronically Signed   By: Curlene Dolphin M.D.   On: 10/08/2013 18:00   Ct Abdomen Pelvis W Contrast  10/08/2013   CLINICAL DATA:  Shortness of breath and tachycardia. 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  EXAM: CT ANGIOGRAPHY CHEST AND CT ABDOMEN AND PELVIS WITH CONTRAST  TECHNIQUE: Multidetector CT imaging of the chest, abdomen and pelvis was performed using the standard protocol during bolus administration of intravenous contrast. Multiplanar CT image reconstructions and MIPs were obtained to evaluate the vascular anatomy.  CONTRAST:  157mL OMNIPAQUE IOHEXOL 350 MG/ML SOLN  COMPARISON:  Chest radiograph 09/23/2013  FINDINGS: Chest: There is some patient respiratory motion that creates some linear artifacts in the ascending aorta and main pulmonary artery. This is a technically satisfactory examination of the pulmonary arterial tree. No focal filling defects are identified to suggest pulmonary embolism.  Small hiatal hernia.  The thoracic aorta is normal in caliber. Negative for aortic dissection.  Negative for lymphadenopathy in the chest. There is mild cardiomegaly. There may be a trace pericardial effusion versus pericardial thickening inferiorly.  There is  a trace right pleural effusion.  Lung windows demonstrate moderate patchy areas of atelectasis in both lower lobes, left greater than right, and in the left upper lobe. No definite consolidation.  No acute bony abnormality.  Abdomen/pelvis: Status postcholecystectomy. The liver is normal in size and enhancement. No liver mass or biliary ductal dilatation. The spleen is within normal limits for size. Nonspecific 6 mm low-density lesion in the spleen is noted on image 16. No prior studies for comparison.  The pancreas, adrenal glands, and kidneys are within normal limits.  Stomach is not very distended. Small bowel loops are normal in caliber and wall thickness. The colon is normal in caliber and wall thickness. Patient is status post hysterectomy. No adnexal mass.  There is a moderate amount of free fluid in the pelvis. This measures  simple water density. No evidence of pelvic abscess. Urinary bladder appears within normal limits.  There is a small amount of perihepatic ascites.  Mild subcutaneous stranding in the anterior abdominal wall, without fluid collections.  Abdominal aorta is normal in caliber. Negative for lymphadenopathy in the abdomen or pelvis. Negative for free intraperitoneal air.  No acute or suspicious bony abnormality. Facet joint degenerative changes of the lumbar spine noted.  Review of the MIP images confirms the above findings.  IMPRESSION: 1. No evidence of pulmonary embolism. 2. Moderate patchy areas of atelectasis in both lungs. 3. Trace right pleural effusion and possible trace pericardial effusion. 4. Mild cardiomegaly. 5. No evidence of intra-abdominal or intrapelvic abscess. 6. Small amount of abdominal free fluid and moderate amount pelvic free fluid. 7. Status post hysterectomy.  No adnexal mass or lymphadenopathy.   Electronically Signed   By: Curlene Dolphin M.D.   On: 10/08/2013 18:00    Microbiology: Recent Results (from the past 240 hour(s))  URINE CULTURE     Status: None   Collection Time    10/08/13 12:27 PM      Result Value Ref Range Status   Urine Culture, Routine Culture, Urine   Final   Comment: Final - ===== COLONY COUNT: =====     45,000 COLONIES/ML     Multiple bacterial morphotypes present, none     predominant. Suggest appropriate recollection if      clinically indicated.  CULTURE, BLOOD (ROUTINE X 2)     Status: None   Collection Time    10/08/13  2:06 PM      Result Value Ref Range Status   Specimen Description BLOOD RIGHT ARM   Final   Special Requests BOTTLES DRAWN AEROBIC AND ANAEROBIC  10 CC   Final   Culture  Setup Time     Final   Value: 10/08/2013 17:25     Performed at [facility]   Culture     Final   Value:        BLOOD CULTURE RECEIVED NO GROWTH TO DATE CULTURE WILL BE HELD FOR 5 DAYS BEFORE ISSUING A FINAL NEGATIVE REPORT     Performed at [facility]   Report Status PENDING   Incomplete  CULTURE, BLOOD (ROUTINE X 2)     Status: None   Collection Time    10/08/13  2:11 PM      Result Value Ref Range Status   Specimen Description BLOOD LEFT ARM   Final   Special Requests BOTTLES DRAWN AEROBIC ONLY  3CC   Final   Culture  Setup Time     Final   Value: 10/08/2013 17:24  Performed at [facility]     Final   Value:        BLOOD CULTURE RECEIVED NO GROWTH TO DATE CULTURE WILL BE HELD FOR 5 DAYS BEFORE ISSUING A FINAL NEGATIVE REPORT     Performed at [facility]   Report Status PENDING   Incomplete     Labs: Basic Metabolic Panel:  Recent Labs Lab 10/08/13 1158 10/08/13 1420 10/09/13 0535 10/10/13 0625 10/11/13 0510 10/12/13 0500  NA 139  --  140 142 137 141  K 4.2  --  4.0 3.7 3.6* 3.8  CL  --   --  104 107 101 105  CO2 25  --  23 24 22 24   GLUCOSE 99  --  103* 103* 96 96  BUN 8.6  --  6 5* 5* 7  CREATININE 0.7 0.58 0.65 0.61 0.56 0.65  CALCIUM 10.0  --  9.3 9.5 9.4 9.8  MG  --  1.7  --   --   --   --    Liver Function Tests:  Recent Labs Lab 10/08/13 1158  AST 26  ALT 24  ALKPHOS 72  BILITOT 0.46  PROT 7.2  ALBUMIN 3.0*   No results found for this basename: LIPASE, AMYLASE,  in the last 168 hours No results found for this basename: AMMONIA,  in the last 168 hours CBC:  Recent Labs Lab 10/08/13 1158  10/08/13 1420 10/09/13 0535 10/10/13 0625 10/10/13 2200 10/11/13 0510 10/12/13 0500  WBC 8.9  < > 7.3 7.3 5.5 7.2 6.5 6.0  NEUTROABS 6.1  --  5.1  --   --   --   --   --   HGB 9.3*  < > 9.0* 8.0* 7.9* 10.2* 9.5* 9.5*  HCT 29.1*  < > 28.2* 25.6* 24.5* 31.7* 28.6* 29.4*  MCV 83.7  < > 83.4 83.9 83.9 84.8 82.9 84.7  PLT 453*  < > 400 349 305 327 348 353  < > = values in this interval not displayed. Cardiac Enzymes:  Recent Labs Lab 10/09/13 0857 10/09/13 1405 10/09/13 2004  TROPONINI <0.30 <0.30 <0.30   BNP: BNP (last 3 results) No results found for this  basename: PROBNP,  in the last 8760 hours CBG: No results found for this basename: GLUCAP,  in the last 168 hours     Signed:  [facility] MD [facility] 10/12/2013, 5:23 PM

## 2013-10-12 NOTE — Progress Notes (Signed)
Pt discharged home with family. Discharge teaching reviewed and pt verbalized understanding. IVs d/c'd. Pt verbalized no concerns or further questions at this time.

## 2013-10-14 LAB — CULTURE, BLOOD (ROUTINE X 2)
CULTURE: NO GROWTH
Culture: NO GROWTH

## 2013-10-18 ENCOUNTER — Ambulatory Visit (INDEPENDENT_AMBULATORY_CARE_PROVIDER_SITE_OTHER): Payer: BC Managed Care – PPO | Admitting: Family Medicine

## 2013-10-18 ENCOUNTER — Other Ambulatory Visit: Payer: Self-pay | Admitting: Family Medicine

## 2013-10-18 ENCOUNTER — Encounter: Payer: Self-pay | Admitting: Family Medicine

## 2013-10-18 VITALS — BP 110/64 | HR 96 | Temp 98.1°F | Resp 18 | Wt 276.0 lb

## 2013-10-18 DIAGNOSIS — Z09 Encounter for follow-up examination after completed treatment for conditions other than malignant neoplasm: Secondary | ICD-10-CM

## 2013-10-18 LAB — COMPLETE METABOLIC PANEL WITH GFR
ALBUMIN: 4.4 g/dL (ref 3.5–5.2)
ALT: 27 U/L (ref 0–35)
AST: 19 U/L (ref 0–37)
Alkaline Phosphatase: 77 U/L (ref 39–117)
BUN: 8 mg/dL (ref 6–23)
CALCIUM: 11.1 mg/dL — AB (ref 8.4–10.5)
CHLORIDE: 102 meq/L (ref 96–112)
CO2: 28 mEq/L (ref 19–32)
Creat: 0.68 mg/dL (ref 0.50–1.10)
GLUCOSE: 83 mg/dL (ref 70–99)
POTASSIUM: 5.1 meq/L (ref 3.5–5.3)
Sodium: 138 mEq/L (ref 135–145)
Total Bilirubin: 0.4 mg/dL (ref 0.2–1.2)
Total Protein: 7.4 g/dL (ref 6.0–8.3)

## 2013-10-18 LAB — CBC WITH DIFFERENTIAL/PLATELET
BASOS ABS: 0.1 10*3/uL (ref 0.0–0.1)
BASOS PCT: 1 % (ref 0–1)
Eosinophils Absolute: 0.4 10*3/uL (ref 0.0–0.7)
Eosinophils Relative: 7 % — ABNORMAL HIGH (ref 0–5)
HCT: 37.7 % (ref 36.0–46.0)
HEMOGLOBIN: 12.5 g/dL (ref 12.0–15.0)
Lymphocytes Relative: 22 % (ref 12–46)
Lymphs Abs: 1.3 10*3/uL (ref 0.7–4.0)
MCH: 27.7 pg (ref 26.0–34.0)
MCHC: 33.2 g/dL (ref 30.0–36.0)
MCV: 83.4 fL (ref 78.0–100.0)
Monocytes Absolute: 0.7 10*3/uL (ref 0.1–1.0)
Monocytes Relative: 12 % (ref 3–12)
NEUTROS ABS: 3.3 10*3/uL (ref 1.7–7.7)
NEUTROS PCT: 58 % (ref 43–77)
Platelets: 470 10*3/uL — ABNORMAL HIGH (ref 150–400)
RBC: 4.52 MIL/uL (ref 3.87–5.11)
RDW: 16.4 % — AB (ref 11.5–15.5)
WBC: 5.7 10*3/uL (ref 4.0–10.5)

## 2013-10-18 MED ORDER — CARVEDILOL 6.25 MG PO TABS: 12.5000 mg | ORAL_TABLET | Freq: Two times a day (BID) | ORAL | Status: DC

## 2013-10-18 NOTE — Progress Notes (Signed)
Subjective:    Patient ID: Shannon [name], female    DOB: [birth year], 48 y.o.   MRN: [id number]  HPI Patient is here today to establish care and to follow up her most recent hospitalization.  I have copied relevant portions of her DC summary and included them below for my reference:  Admit date: 10/08/2013  Discharge date: 10/12/2013  Time spent: 65 minutes  Recommendations for Outpatient Follow-up:  1. Followup with Dr. Denman George of GYN oncology on 10/25/2013. 2. Followup with Dr. Sallyanne Kuster in 2 weeks for further evaluation and probable outpatient stress test. 3. Followup with [facility], MD in 1 week. On followup patient needs a CBC to followup on H&H. Patient needs basic metabolic profile done to followup on electrolytes and renal function. Patient's blood pressure also needs to be reassessed at that visit. Patient was noted to have a iron deficiency anemia and this will need to be followed up upon. Discharge Diagnoses:  Principal Problem:  SIRS (systemic inflammatory response syndrome)  Active Problems:  Postoperative fever  Endometrial cancer, grade I  Morbid obesity  Ovarian cancer on left  Anemia, iron deficiency  Tachycardia  Dyspnea  Discharge Condition: Stable and improved  Diet recommendation: Regular  Filed Weights    10/10/13 2148  10/11/13 0500  10/12/13 0511   Weight:  130.1 kg (286 lb 13.1 oz)  129.275 kg (285 lb)  129.729 kg (286 lb)   History of present illness:  Shannon [name] is a 48 y.o. 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  Patient stated that postoperatively on day of discharge she had a little bit of leakage in the left sided abdominal incision. Patient had followed up with the nurse practitioner incision was opened slightly with increased leakage which subsequently resolved. Patient also stated that she noted a lower abdominal hematoma around the lower abdominal incision site placed a washcloth on it with some improvement.  Patient endorsed some constipation, dysuria, generalized weakness, flatus. Patient denied any diarrhea, no chest parenchyma no melena, no hematemesis, no hematochezia. Patient stated had last bowel movement one day prior to admission.  Patient was seen on followup by GYN oncologist office and subsequently transferred to the hospital for further workup on the postop fever.  Hospitalist group was called to admit the patient for further evaluation and management.  Hospital Course:  #1 systemic inflammatory response syndrome  Patient was admitted with fever and tachycardia. Unknown etiology. CT of the chest abdomen and pelvis negative for any acute abnormalities or abscess formation. Urinalysis is negative. Patient had blood cultures drawn with no growth to date. Patient with normal white count. Patient was placed empirically on IV vancomycin and IV Zosyn. Patient's fever curve trended down. Patient improved clinically. IV vancomycin was subsequently discontinued and patient maintained on IV Zosyn. IV Zosyn was subsequently transitioned to oral Augmentin which patient tolerated. Patient was discharged home on 5 more days of oral Augmentin to complete a course of antibiotic therapy. Patient is to followup with PCP in GYN oncology as outpatient.  #2 tachycardia/abnormal EKG/LVH  Unknown etiology. Maybe secondary to pain versus #1, and  probably symptomatic anemia. TSH within normal limits at 2.650. Cardiac enzymes negative x2. EKG with sinus tachycardia and ST-T  wave abnormalities in leads 2,3 aVF V3 through V6. More pronounced than on prior EKGs. 2-D echo with EF of 65-70% with no wall motion abnormalities. Mildly dilated left atrium. Mild left ventricular concentric hypertrophy. Cardiology consult was subsequently obtained. Patient was transfused 2 units packed red blood cells as tachycardia may be secondary to anemia. Patient has been seen by cardiology, and patient's tachycardia may likely be secondary to acute illness with fever associated vasodilatation and anemia. EKG changes secondary to LVH, voltage mass by obesity and the ST depression likely secondary to tachycardia. Patient was started on a beta blocker and dose was adjusted per cardiology. Cardiology recommended outpatient followup for probable formal stress test as outpatient. Lifestyle modifications.. Follow.  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 Oncology was consulted and followed the patient throughout the hospitalization.  #4 postop fever  Unknown etiology. Patient was admitted with postoperative fever. Patient was pan cultured. Urinalysis negative. CT chest negative. CT abdomen and pelvis negative for any acute abs abnormalities. Blood cultures were also obtained with no growth to date by day of discharge. Patient's fever curve trended down. Patient was initially placed empirically on IV vancomycin IV Zosyn. Patient improved clinically. IV vancomycin was subsequently discontinued. Patient remained afebrile for 48 hours prior to discharge. Patient be discharged home on Augmentin twice daily for 5 more days to complete a 8 day course of antibiotic therapy. Patient is to followup with PCP and GYN oncology as outpatient.  #5 anemia/severe iron deficiency anemia  Patient was noted to be anemic on admission. Anemia panel consistent with iron deficiency anemia with iron  level of less than 10 and also likely with a dilutional component. Patient with no overt bleeding. Patient was given some IV iron. Due to patient's tachycardia it was felt patient likely did have a symptomatic anemia and patient was subsequently transfused 2 units of packed red blood cells when her hemoglobin was down to 7.9. Patient had appropriate response such that by day of discharge her hemoglobin was 9.5. Patient was also started on oral iron supplementation. Patient will followup with PCP as outpatient.  Procedures:  CT chest, abdomen, pelvis 10/08/2013  2 units packed red blood cells  2-D echo 10/09/2013  Consultations:  GYN/oncology: Dr. Denman George 10/08/2013  Cardiology: Dr. Sallyanne Kuster 10/09/2013     Patient's heart rate is still slightly elevated at 96 beats per minute. She denies any chest pain shortness of breath or lightheadedness. She is to monitor her blood pressure at home. Her blood pressure at home is typically ranging 130-150/80-90. [place] typically ranges 90-100. She is scheduled to see the cardiologist the first week in October. Fortunately they believe they got all the cancer.  The patient states that the present time if there is no need for radiation or chemotherapy. Also the patient has been afebrile ever since her discharge from the hospital. Patient states that she was having severe abdominal pain and fevers prior to her hospitalization. Within 24 hours starting antibiotics in the hospital, the fevers improved and abdominal pain dramatically improved. She's had no further fever abdominal pain since discharge from the hospital. Of note there was free fluid is seen on the CT scan of abdomen and pelvis. This may worsen the patient's fever and abdominal pain. At the present time she is asymptomatic.  She's completed 10 days of antibiotic therapy. Past Medical History  Diagnosis Date  . PONV (postoperative nausea and vomiting)   . Hypertension     not taking med x1 yr. couldn't afford.    Marland Kitchen UTI (lower urinary tract infection)     history of 1 month ago  . Cancer     Uterine cancer dx. surgery planned TAH  . Uterine cancer   . Ovarian cancer    Past Surgical History  Procedure Laterality Date  . Cholecystectomy      laparoscopic  . Robotic assisted total hysterectomy with bilateral salpingo oopherectomy Bilateral 09/28/2013    Procedure: ROBOTIC ASSISTED TOTAL HYSTERECTOMY WITH BILATERAL SALPINGO OOPHORECTOMY WITH  LYMPH NODE DISECTION, ;  Surgeon: Everitt Amber, MD;  Location: [facility];  Service: Gynecology;  Laterality: Bilateral;   Current Outpatient Prescriptions on File Prior to Visit  Medication Sig Dispense Refill  . acetaminophen (TYLENOL) 500 MG tablet Take 500 mg by mouth every 6 (six) hours as needed (For pain.).      Marland Kitchen bisacodyl (DULCOLAX) 5 MG EC tablet Take 10 mg by mouth daily as needed for moderate constipation.      . docusate sodium (STOOL SOFTENER) 100 MG capsule Take 100 mg by mouth daily as needed for mild constipation.      . ferrous sulfate 325 (65 FE) MG tablet Take 1 tablet (325 mg total) by mouth 3 (three) times daily with meals.    3  . oxyCODONE-acetaminophen (PERCOCET/ROXICET) 5-325 MG per tablet Take 1-2 tablets by mouth every 4 (four) hours as needed for moderate pain or severe pain (when taking fluids).       No current facility-administered medications on file prior to visit.   Allergies  Allergen Reactions  . Contrast Media [Iodinated Diagnostic Agents] Anaphylaxis    Swelling of the throat, difficulty breathing  . Tetanus Toxoids     Swelling at site, lethargic x few days   Past Surgical History  Procedure Laterality Date  . Cholecystectomy      laparoscopic  . Robotic assisted total hysterectomy with bilateral salpingo oopherectomy Bilateral 09/28/2013    Procedure: ROBOTIC ASSISTED TOTAL HYSTERECTOMY WITH BILATERAL SALPINGO OOPHORECTOMY WITH  LYMPH NODE DISECTION, ;  Surgeon: Everitt Amber, MD;  Location: [facility];  Service: Gynecology;   Laterality: Bilateral;   History   Social History  . Marital Status: Single    Spouse Name: N/A    Number of Children: N/A  . Years of Education: N/A   Occupational History  . Not on file.   Social History Main Topics  . Smoking status: Never Smoker   . Smokeless tobacco: Never Used  . Alcohol Use: No  . Drug Use: No  . Sexual Activity: Not Currently   Other Topics Concern  . Not on file   Social History Narrative  . No narrative on file    Review of Systems  All other systems reviewed and are negative.      Objective:   Physical Exam  Vitals reviewed. Constitutional: She appears well-developed and well-nourished.  Neck: Neck supple. No thyromegaly present.  Cardiovascular: Normal rate, regular rhythm and normal heart sounds.  Exam reveals no gallop.   No murmur heard. Pulmonary/Chest: Effort normal and breath sounds normal. No respiratory distress. She has no wheezes. She has no rales.  Abdominal: Soft. Bowel sounds are normal. She exhibits no distension and no mass. There is no tenderness. There is no rebound and no guarding.  Musculoskeletal: She exhibits  no edema.          Assessment & Plan:  Hospital discharge follow-up - Plan: COMPLETE METABOLIC PANEL WITH GFR, CBC with Differential  I believe the patient's anemia combination of iron deficiency as well as heavy vaginal bleeding prior to her hysterectomy. We'll repeat a CBC today. Her hemoglobin is stable, I would like her to continue iron recheck CBC in 3 months. Patient's heart rate is better but still not ideal. Increase carvedilol to 12.5 mg by mouth twice a day and have her follow up with cardiology as planned. I believe the patient's fever was likely due to the free fluid that was seen on the CT scan of abdomen and pelvis. It is possible that this was an evolving infection. At present time she is asymptomatic and afebrile. She's completed 10 days of antibiotic therapy. Therefore we'll discontinue  antibiotics. If fevers return, I would start the patient back on Augmentin immediately and also repeat CT scan of abdomen and pelvis.

## 2013-10-20 ENCOUNTER — Telehealth: Payer: Self-pay | Admitting: Family Medicine

## 2013-10-20 ENCOUNTER — Other Ambulatory Visit: Payer: Self-pay | Admitting: Gynecologic Oncology

## 2013-10-20 DIAGNOSIS — D649 Anemia, unspecified: Secondary | ICD-10-CM

## 2013-10-20 DIAGNOSIS — B373 Candidiasis of vulva and vagina: Secondary | ICD-10-CM

## 2013-10-20 DIAGNOSIS — B3731 Acute candidiasis of vulva and vagina: Secondary | ICD-10-CM

## 2013-10-20 MED ORDER — FLUCONAZOLE 100 MG PO TABS: 100.0000 mg | ORAL_TABLET | Freq: Once | ORAL | Status: DC

## 2013-10-20 NOTE — Telephone Encounter (Signed)
Pt aware of lab results and provider recommendations.  CBC ordered for one month

## 2013-10-20 NOTE — Progress Notes (Signed)
Patient called with complaints of itching and burning of the vulva.  Has been on antibiotics and finished on Sunday.  "I think I have a yeast infection."  Has had yeast infections in the past.  Instructed that diflucan will be called in and should be taken as a one time dose.  Refill of one tablet prescribed in case yeast infection improves but does not resolve.  Verbalizing understanding.  Advised to call if symptoms persist or worsen after two to three days.

## 2013-10-20 NOTE — Telephone Encounter (Signed)
Message copied by Olena Mater on Wed Oct 20, 2013  2:52 PM ------      Message from: Jenna Luo      Created: Tue Oct 19, 2013  6:54 AM       hgb has risen from 9.5 to 12.5 which is amazing.  Recheck cbc in 1 month.  May be able to d/c iron at that time. ------

## 2013-10-25 ENCOUNTER — Ambulatory Visit: Payer: BC Managed Care – PPO | Attending: Gynecologic Oncology | Admitting: Gynecologic Oncology

## 2013-10-25 ENCOUNTER — Encounter: Payer: Self-pay | Admitting: Gynecologic Oncology

## 2013-10-25 DIAGNOSIS — C562 Malignant neoplasm of left ovary: Secondary | ICD-10-CM

## 2013-10-25 DIAGNOSIS — Z90710 Acquired absence of both cervix and uterus: Secondary | ICD-10-CM | POA: Insufficient documentation

## 2013-10-25 DIAGNOSIS — B3731 Acute candidiasis of vulva and vagina: Secondary | ICD-10-CM | POA: Insufficient documentation

## 2013-10-25 DIAGNOSIS — C569 Malignant neoplasm of unspecified ovary: Secondary | ICD-10-CM | POA: Insufficient documentation

## 2013-10-25 DIAGNOSIS — C549 Malignant neoplasm of corpus uteri, unspecified: Secondary | ICD-10-CM | POA: Diagnosis present

## 2013-10-25 DIAGNOSIS — C541 Malignant neoplasm of endometrium: Secondary | ICD-10-CM

## 2013-10-25 DIAGNOSIS — E8940 Asymptomatic postprocedural ovarian failure: Secondary | ICD-10-CM | POA: Insufficient documentation

## 2013-10-25 DIAGNOSIS — B373 Candidiasis of vulva and vagina: Secondary | ICD-10-CM | POA: Insufficient documentation

## 2013-10-25 MED ORDER — MICONAZOLE NITRATE 4 % VA CREA: 1.0000 | TOPICAL_CREAM | Freq: Once | VAGINAL | Status: DC

## 2013-10-25 MED ORDER — ESTROGENS CONJUGATED 0.3 MG PO TABS: 0.3000 mg | ORAL_TABLET | Freq: Every day | ORAL | Status: DC

## 2013-10-25 NOTE — Progress Notes (Signed)
POSTOPERATIVE EVALUTION  CC: postoperative check  HPI:  Shannon [name] is a 48 y.o. year old. initially seen in consultation on 09/02/13 for grade 1 endometrial cancer.  She then underwent a robotic hysterectomy, BSO and lymphadenectomy on 02/02/53 without complications.  Her postoperative course was uncomplicated and she was discharged on postoperative day 1.  Her final pathologic diagnosis is a Stage IA Grade 1 endometrioid endometrial cancer with no lymphovascular space invasion, no myometrial invasion and negative lymph nodes. 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  She was seen on 10/08/13 for a postoperative check and to discuss her pathology results and treatment plan. She was found to be febrile with abdominal pain and was admitted to the hospital for further workup. A CT of the abdomen and pelvis was negative for abscess or pathology (it showed only fluid consistent with lymphatic fluid s/p lymphadenectomy). Blood and urine cultures were negative. Her WBC was normal range. She was treated with broad spectrum IV antibiotics for 4 days and after 48 hours afebrile, was discharged to home on augmentin. Since being home she has had no fevers and has continued to generally improve with improving pain and appetite. She has had vaginal irritation consistent with a yeast infection with minimal relief to diflucan x1 dose PO. She has had light vaginal spotting/pink discharge in the past week.   Allergies  Allergen Reactions  . Contrast Media [Iodinated Diagnostic Agents] Anaphylaxis    Swelling of the throat, difficulty breathing  . Tetanus Toxoids     Swelling at site, lethargic x few days   Outpatient Encounter Prescriptions as of 10/25/2013  Medication Sig  . acetaminophen (TYLENOL) 500 MG tablet Take 500 mg by mouth every 6 (six) hours as needed (For  pain.).  Marland Kitchen bisacodyl (DULCOLAX) 5 MG EC tablet Take 10 mg by mouth daily as needed for moderate constipation.  . carvedilol (COREG) 6.25 MG tablet Take 2 tablets (12.5 mg total) by mouth 2 (two) times daily with a meal.  . docusate sodium (STOOL SOFTENER) 100 MG capsule Take 100 mg by mouth daily as needed for mild constipation.  . ferrous sulfate 325 (65 FE) MG tablet Take 1 tablet (325 mg total) by mouth 3 (three) times daily with meals.  . fluconazole (DIFLUCAN) 100 MG tablet Take 1 tablet (100 mg total) by mouth once.  Marland Kitchen oxyCODONE-acetaminophen (PERCOCET/ROXICET) 5-325 MG per tablet Take 1-2 tablets by mouth every 4 (four) hours as needed for moderate pain or severe pain (when taking fluids).  . estrogens, conjugated, (PREMARIN) 0.3 MG tablet Take 1 tablet (0.3 mg total) by mouth daily. Take daily for 21 days then do not take for 7 days.  Marland Kitchen MICONAZOLE NITRATE VAGINAL 4 % CREA Place 1 ampule vaginally once.    Past Medical History  Diagnosis Date  . PONV (postoperative nausea and vomiting)   . Hypertension     not taking med x1 yr. couldn't afford.  Marland Kitchen UTI (lower urinary tract infection)     history of 1 month ago  . Cancer     Uterine cancer dx. surgery planned TAH  . Uterine cancer   . Ovarian cancer    Past Surgical History  Procedure Laterality Date  . Cholecystectomy      laparoscopic  . Robotic assisted total hysterectomy with bilateral salpingo oopherectomy Bilateral 09/28/2013    Procedure: ROBOTIC ASSISTED TOTAL HYSTERECTOMY WITH BILATERAL  SALPINGO OOPHORECTOMY WITH  LYMPH NODE DISECTION, ;  Surgeon: Everitt Amber, MD;  Location: [facility];  Service: Gynecology;  Laterality: Bilateral;  History reviewed. No pertinent family history.  History   Social History  . Marital Status: Single    Spouse Name: N/A    Number of Children: N/A  . Years of Education: N/A   Social History Main Topics  . Smoking status: Never Smoker   . Smokeless tobacco: Never Used  . Alcohol Use: No  .  Drug Use: No  . Sexual Activity: Not Currently   Other Topics Concern  . None   Social History Narrative  . None    Review of systems: Constitutional:  She has no weight gain or weight loss. No fever or chills.  Eyes: No blurred vision Ears, Nose, Mouth, Throat: No dizziness, headaches or changes in hearing. No mouth sores. Cardiovascular: No chest pain, palpitations or edema. Respiratory:  No shortness of breath, wheezing or cough Gastrointestinal: She has normal bowel movements without diarrhea or constipation. She denies any nausea or vomiting. She denies blood in her stool or heart burn. Genitourinary:  See HPI, She has no hematuria, but does have dysuria, though no incontinence.  Musculoskeletal: Denies muscle weakness or joint pains.  Skin:  She has no skin changes, rashes or itching Neurological:  Denies dizziness or headaches. No neuropathy, no numbness or tingling. Psychiatric:  She denies depression or anxiety. Hematologic/Lymphatic:   No easy bruising or bleeding   Physical Exam: Last menstrual period 08/06/2013. General: Well dressed, well nourished in no apparent distress.   HEENT:  Normocephalic and atraumatic, no lesions.  Extraocular muscles intact. Sclerae anicteric. Pupils equal, round, reactive. No mouth sores or ulcers. Thyroid is normal size, not nodular, midline. Skin:  No lesions or rashes. Breasts:  Soft, symmetric.  No skin or nipple changes.  No palpable LN or masses. Lungs:  Clear to auscultation bilaterally.  No wheezes. Cardiovascular:  Regular rate and rhythm.  No murmurs or rubs. Mildly tachycardic Abdomen:  Soft, generally tender, nondistended but obese.  No palpable masses.  No hepatosplenomegaly.  No ascites. Normal bowel sounds.  No hernias.  Incisions are intact, not draining, healing normally with no signs of cellulitis. The suprapubic incision is also healing well with no cellulitis or drainage.  Genitourinary: Normal EGBUS  Vaginal cuff intact.   No bleeding or discharge, no visible cuff cellulitis.  There is erythema of the vagina and vulva consistent with candidiasis. There are no lesions or separations of the cuff. Extremities: No cyanosis, clubbing or edema.  No calf tenderness or erythema. No palpable cords. Psychiatric: Mood and affect are appropriate. Neurological: Awake, alert and oriented x 3. Sensation is intact, no neuropathy.  Musculoskeletal: No pain, normal strength and range of motion.  Assessment:    48 y.o. 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   S/p robotic hysterectomy, BSO and lymphadenectomy on 09/28/13. no LVSI, no myometrial invasion, negative pelvic washings and negative lymph nodes. Ovarian cancer was low grade, with no capsular involvement. Vulvovaginal candidiasis. History of postoperative fever.  Plan: 1) Pathology reports reviewed today 2) Treatment counseling - Very low risk (<5%) for recurrence given age, grade, depth of myometrial invasion and LVSI status. Multidisciplinary tumor board recommendation is for routine surveillance with frequent pelvic exams and visits.  We will start with visits every 3 months x 2 years, then every 6 months for 3 more years,  at which time she can return to annual visits.  Discussed signs and symptoms of recurrence including vaginal bleeding or discharge, leg pain or swelling and changes in bowel or bladder habits. She was given the opportunity to ask questions, which were answered to her satisfaction, and she is agreement with the above mentioned plan of care.  3)  Postoperative fever of unknown origin: Patient has resolved this infectious issue and is healing appropriately. 4) Vulvovaginal candidiasis: I have recommended topical monistat therapy.  5) I will see her for followup in 3 months.  Donaciano Eva, MD

## 2013-11-01 ENCOUNTER — Telehealth: Payer: Self-pay | Admitting: Family Medicine

## 2013-11-01 MED ORDER — CARVEDILOL 6.25 MG PO TABS: 12.5000 mg | ORAL_TABLET | Freq: Two times a day (BID) | ORAL | Status: DC

## 2013-11-01 NOTE — Telephone Encounter (Signed)
Patient is calling to say that her coreg is not at the pharmacy wants [place] to send that over asap if possible she is out [place]

## 2013-11-01 NOTE — Telephone Encounter (Signed)
Med sent to pharm 

## 2013-11-04 ENCOUNTER — Ambulatory Visit (INDEPENDENT_AMBULATORY_CARE_PROVIDER_SITE_OTHER): Payer: BC Managed Care – PPO | Admitting: Physician Assistant

## 2013-11-04 ENCOUNTER — Encounter: Payer: Self-pay | Admitting: Physician Assistant

## 2013-11-04 VITALS — BP 130/84 | HR 95 | Ht 62.0 in | Wt 276.2 lb

## 2013-11-04 DIAGNOSIS — R9431 Abnormal electrocardiogram [ECG] [EKG]: Secondary | ICD-10-CM

## 2013-11-04 DIAGNOSIS — D509 Iron deficiency anemia, unspecified: Secondary | ICD-10-CM

## 2013-11-04 DIAGNOSIS — R000 Tachycardia, unspecified: Secondary | ICD-10-CM

## 2013-11-04 NOTE — Assessment & Plan Note (Addendum)
With anteroseptal TWI.  Will order [facility] as was recommended during hospitalization.  Hr better controlled.  Continue 12.5 mg of Coreg twice daily

## 2013-11-04 NOTE — Assessment & Plan Note (Signed)
Hgb 12.5 on 9/21.

## 2013-11-04 NOTE — Assessment & Plan Note (Signed)
We discussed referral to a registered dietician.  i will make arrangements.

## 2013-11-04 NOTE — Progress Notes (Signed)
Date:  11/04/2013   ID:  Shannon [name], DOB [birth year], MRN [id number]  PCP:  Odette Fraction, MD  Primary Cardiologist: Croitoru     History of Present Illness: Shannon [name] is a 48 y.o. morbidly obese female with a past medical history significant for inconsistently treated HTN, now admitted in September for fever and anemia following hysterectomy. Her ECG showed sinus tachycardia and prominent repolarization abnormalities. She did not endorse any cardiac complaints her then or now.   In the hospital she had downsloping ST depression and inverted T waves in V3-V6 and in the inferior leads. The pattern is quite suggestive of LVH, but the voltage for LVH is not seen. It may be masked by obesity. A previous ECG from 8/27 shows an identical pattern, but the magnitude of ST depression was much lower. Today office she is now ST depression but does have T wave inversion in leads V1 through 4, III.  Her echo shows normal regional LV wall motion and overall LVEF. There is LVH and LA enlargement, in a pattern consistent with hypertensive heart disease.   patient presents today for posthospital evaluation reports doing well.  She currently denies nausea, vomiting, fever, chest pain, shortness of breath, orthopnea, dizziness, PND, cough, congestion, hematochezia, melena, lower extremity edema,.  She has mild abdominal pain and her dog recently jumped all of her sutures.  Wt Readings from Last 3 Encounters:  11/04/13 276 lb 3.2 oz (125.283 kg)  10/18/13 276 lb (125.193 kg)  10/12/13 286 lb (129.729 kg)     Past Medical History  Diagnosis Date  . PONV (postoperative nausea and vomiting)   . Hypertension     not taking med x1 yr. couldn't afford.  Shannon [name] Kitchen UTI (lower urinary tract infection)     history of 1 month ago  . Cancer     Uterine cancer dx. surgery planned TAH  . Uterine cancer   . Ovarian cancer     Current Outpatient Prescriptions  Medication Sig Dispense Refill  . carvedilol  (COREG) 6.25 MG tablet Take 2 tablets (12.5 mg total) by mouth 2 (two) times daily with a meal.  120 tablet  3  . docusate sodium (STOOL SOFTENER) 100 MG capsule Take 100 mg by mouth daily as needed for mild constipation.      Shannon [name] Kitchen estrogens, conjugated, (PREMARIN) 0.3 MG tablet Take 1 tablet (0.3 mg total) by mouth daily. Take daily for 21 days then do not take for 7 days.  30 tablet  11  . ferrous sulfate 325 (65 FE) MG tablet Take 1 tablet (325 mg total) by mouth 3 (three) times daily with meals.    3   No current facility-administered medications for this visit.    Allergies:    Allergies  Allergen Reactions  . Contrast Media [Iodinated Diagnostic Agents] Anaphylaxis    Swelling of the throat, difficulty breathing  . Tetanus Toxoids     Swelling at site, lethargic x few days    Social History:  The patient  reports that she has never smoked. She has never used smokeless tobacco. She reports that she does not drink alcohol or use illicit drugs.   Family history:  History reviewed. No pertinent family history.  ROS:  Please see the history of present illness.  All other systems reviewed and negative.   PHYSICAL EXAM: VS:  BP 130/84  Pulse 95  Ht 5\' 2"  (1.575 m)  Wt 276 lb 3.2 oz (125.283 kg)  BMI 50.50  kg/m2  LMP 08/06/2013 Morbidly obese, well developed, in no acute distress HEENT: Pupils are equal round react to light accommodation extraocular movements are intact.  Neck: no JVDNo cervical lymphadenopathy. Cardiac: Regular rate and rhythm without murmurs rubs or gallops. Lungs:  clear to auscultation bilaterally, no wheezing, rhonchi or rales Abd: soft, mildly tender status post hysterectomy recently, positive bowel sounds all quadrants, Ext: no lower extremity edema.  2+ radial and dorsalis pedis pulses. Skin: warm and dry Neuro:  Grossly normal  EKG: Sinus rhythm rate 95 beats per minute with anteroseptal T wave inversion  ASSESSMENT AND PLAN:  Problem List Items  Addressed This Visit   Anemia, iron deficiency     Hgb 12.5 on 9/21.    Tachycardia - Primary     With anteroseptal TWI.  Will order [facility] as was recommended during hospitalization.  Hr better controlled.  Continue 12.5 mg of Coreg twice daily    Relevant Orders      EKG 12-Lead      Myocardial Perfusion Imaging    Other Visit Diagnoses   EKG, abnormal        Relevant Orders       EKG 12-Lead       Myocardial Perfusion Imaging

## 2013-11-04 NOTE — Patient Instructions (Signed)
Your physician recommends that you schedule a follow-up appointment in: 3 Months with Dr C  Your physician has requested that you have a lexiscan myoview. For further information please visit [email or web address]. Please follow instruction sheet, as given.

## 2013-11-11 ENCOUNTER — Ambulatory Visit: Payer: BC Managed Care – PPO | Admitting: Gynecologic Oncology

## 2013-11-16 ENCOUNTER — Telehealth (HOSPITAL_COMMUNITY): Payer: Self-pay

## 2013-11-16 NOTE — Telephone Encounter (Signed)
Encounter complete. 

## 2013-11-17 ENCOUNTER — Telehealth (HOSPITAL_COMMUNITY): Payer: Self-pay

## 2013-11-17 NOTE — Telephone Encounter (Signed)
Encounter complete. 

## 2013-11-18 ENCOUNTER — Ambulatory Visit (HOSPITAL_COMMUNITY)
Admission: RE | Admit: 2013-11-18 | Discharge: 2013-11-18 | Disposition: A | Discharge status: Home / Self Care | Payer: BC Managed Care – PPO | Source: Ambulatory Visit | Attending: Cardiology | Admitting: Cardiology

## 2013-11-18 DIAGNOSIS — R9431 Abnormal electrocardiogram [ECG] [EKG]: Secondary | ICD-10-CM | POA: Diagnosis not present

## 2013-11-18 DIAGNOSIS — I110 Hypertensive heart disease with heart failure: Secondary | ICD-10-CM | POA: Insufficient documentation

## 2013-11-18 DIAGNOSIS — E669 Obesity, unspecified: Secondary | ICD-10-CM | POA: Diagnosis not present

## 2013-11-18 DIAGNOSIS — R0609 Other forms of dyspnea: Secondary | ICD-10-CM | POA: Diagnosis present

## 2013-11-18 DIAGNOSIS — R000 Tachycardia, unspecified: Secondary | ICD-10-CM

## 2013-11-18 MED ORDER — TECHNETIUM TC 99M SESTAMIBI GENERIC - CARDIOLITE
30.0000 | Freq: Once | INTRAVENOUS | Status: AC | PRN
  Administered 2013-11-18: 30 via INTRAVENOUS

## 2013-11-18 MED ORDER — REGADENOSON 0.4 MG/5ML IV SOLN
0.4000 mg | Freq: Once | INTRAVENOUS | Status: AC
  Administered 2013-11-18: 0.4 mg via INTRAVENOUS

## 2013-11-18 NOTE — Procedures (Addendum)
[facility] [address] [place] [address] [phone]  Cardiology Nuclear Med Study  Vandy Shannon [name] is a 48 y.o. female     MRN : [id number]     DOB: 23-Dec-1965  Procedure Date: 11/18/2013  Nuclear Med Background Indication for Stress Test:  Evaluation for Ischemia, [facility] and Abnormal EKG History:  No Cardiac History Cardiac Risk Factors: Hypertension and Obesity  Symptoms:  DOE   Nuclear Pre-Procedure Caffeine/Decaff Intake:  None NPO After: 9:00pm   IV Site: R Hand  IV 0.9% NS with Angio Cath:  22g  Chest Size (in):  40 IV Started by: Crissie Figures, RN  Height: 5\' 2"  (1.575 m)  Cup Size: C  BMI:  Body mass index is 50.47 kg/(m^2). Weight:  276 lb (125.193 kg)   Tech Comments:  N/A    Nuclear Med Study 1 or 2 day study: 2 day  Stress Test Type:  Lexiscan  Order Authorizing Provider:  Sanda Klein, MD   Resting Radionuclide: Technetium 20m Sestamibi  Resting Radionuclide Dose: 30.6 mCi   Stress Radionuclide:  Technetium 56m Sestamibi  Stress Radionuclide Dose: 31.6 mCi           Stress Protocol Rest HR: 74 Stress HR: 114  Rest BP: 1157/110 Stress BP: 150/102  Exercise Time (min): n/a METS: n/a   Predicted Max HR: 175 bpm % Max HR: 65.14 bpm Rate Pressure Product: 18126  Dose of Adenosine (mg):  n/a Dose of Lexiscan: 0.4 mg  Dose of Atropine (mg): n/a Dose of Dobutamine: n/a mcg/kg/min (at max HR)  Stress Test Technologist: Leane Para, CCT Nuclear Technologist: Imagene Riches, CNMT   Rest Procedure:  Myocardial perfusion imaging was performed at rest 45 minutes following the intravenous administration of Technetium 4m Sestamibi. Stress Procedure:  The patient received IV Lexiscan 0.4 mg over 15-seconds.  Technetium 65m Sestamibi injected IV at 30-seconds.  There were no significant changes with Lexiscan.  Quantitative spect images were obtained after a 45 minute delay.  Transient  Ischemic Dilatation (Normal <1.22):  1.0  QGS EDV:  72 ml QGS ESV:  24 ml LV Ejection Fraction: 67%        Rest ECG: NSR - Normal EKG  Stress ECG: Insignificant upsloping ST segment depression.  QPS Raw Data Images:  Normal; no motion artifact; normal heart/lung ratio. Stress Images:  There is decreased uptake in the anterior wall. Rest Images:  Normal homogeneous uptake in all areas of the myocardium. Subtraction (SDS):  These findings are consistent with ischemia.  Impression Exercise Capacity:  Lexiscan with no exercise. BP Response:  Normal blood pressure response. Clinical Symptoms:  No significant symptoms noted. ECG Impression:  No significant ST segment change suggestive of ischemia. Comparison with Prior Nuclear Study: No previous nuclear study performed  Overall Impression:  Low risk stress nuclear study Mild anteroseptal ischemia. Follow up with Dr. Sallyanne Kuster.  LV Wall Motion:  NL LV Function; NL Wall Motion   Lorretta Harp, MD  11/19/2013 12:46 PM

## 2013-11-19 ENCOUNTER — Ambulatory Visit (HOSPITAL_COMMUNITY)
Admission: RE | Admit: 2013-11-19 | Discharge: 2013-11-19 | Disposition: A | Discharge status: Home / Self Care | Payer: BC Managed Care – PPO | Source: Ambulatory Visit | Attending: Cardiovascular Disease | Admitting: Cardiovascular Disease

## 2013-11-19 ENCOUNTER — Other Ambulatory Visit: Payer: BC Managed Care – PPO

## 2013-11-19 DIAGNOSIS — E669 Obesity, unspecified: Secondary | ICD-10-CM | POA: Insufficient documentation

## 2013-11-19 DIAGNOSIS — R0609 Other forms of dyspnea: Secondary | ICD-10-CM | POA: Diagnosis not present

## 2013-11-19 DIAGNOSIS — D649 Anemia, unspecified: Secondary | ICD-10-CM

## 2013-11-19 DIAGNOSIS — R000 Tachycardia, unspecified: Secondary | ICD-10-CM

## 2013-11-19 DIAGNOSIS — I10 Essential (primary) hypertension: Secondary | ICD-10-CM | POA: Diagnosis not present

## 2013-11-19 DIAGNOSIS — R9431 Abnormal electrocardiogram [ECG] [EKG]: Secondary | ICD-10-CM | POA: Diagnosis not present

## 2013-11-19 LAB — CBC WITH DIFFERENTIAL/PLATELET
BASOS ABS: 0.1 10*3/uL (ref 0.0–0.1)
Basophils Relative: 1 % (ref 0–1)
EOS ABS: 0.1 10*3/uL (ref 0.0–0.7)
EOS PCT: 2 % (ref 0–5)
HCT: 41.2 % (ref 36.0–46.0)
Hemoglobin: 13.6 g/dL (ref 12.0–15.0)
LYMPHS PCT: 32 % (ref 12–46)
Lymphs Abs: 1.6 10*3/uL (ref 0.7–4.0)
MCH: 27.5 pg (ref 26.0–34.0)
MCHC: 33 g/dL (ref 30.0–36.0)
MCV: 83.4 fL (ref 78.0–100.0)
Monocytes Absolute: 0.4 10*3/uL (ref 0.1–1.0)
Monocytes Relative: 8 % (ref 3–12)
NEUTROS PCT: 57 % (ref 43–77)
Neutro Abs: 2.9 10*3/uL (ref 1.7–7.7)
PLATELETS: 270 10*3/uL (ref 150–400)
RBC: 4.94 MIL/uL (ref 3.87–5.11)
RDW: 15.3 % (ref 11.5–15.5)
WBC: 5.1 10*3/uL (ref 4.0–10.5)

## 2013-11-19 MED ORDER — TECHNETIUM TC 99M SESTAMIBI GENERIC - CARDIOLITE
30.6000 | Freq: Once | INTRAVENOUS | Status: AC | PRN
Start: 2013-11-19 — End: 2013-11-19
  Administered 2013-11-19: 31 via INTRAVENOUS

## 2013-11-22 ENCOUNTER — Other Ambulatory Visit: Payer: Self-pay | Admitting: Gynecologic Oncology

## 2013-11-22 DIAGNOSIS — E8940 Asymptomatic postprocedural ovarian failure: Secondary | ICD-10-CM

## 2013-11-22 MED ORDER — ESTRADIOL 2 MG PO TABS: 2.0000 mg | ORAL_TABLET | Freq: Every day | ORAL | Status: DC

## 2013-11-22 NOTE — Progress Notes (Signed)
Patient called and wanted to get a different prescription for her estrogen because premarin was costing her $40 a month.  Situation discussed with Dr. Denman George who is agreeable with the patient trying estradiol 2 mg on the [facility] $4 list.  Patient notified to take the pills continuously and to call for any questions or concerns.

## 2014-01-03 ENCOUNTER — Encounter: Payer: Self-pay | Admitting: Family Medicine

## 2014-01-03 ENCOUNTER — Ambulatory Visit (INDEPENDENT_AMBULATORY_CARE_PROVIDER_SITE_OTHER): Payer: BC Managed Care – PPO | Admitting: Family Medicine

## 2014-01-03 DIAGNOSIS — F329 Major depressive disorder, single episode, unspecified: Secondary | ICD-10-CM

## 2014-01-03 DIAGNOSIS — F32A Depression, unspecified: Secondary | ICD-10-CM

## 2014-01-03 DIAGNOSIS — R000 Tachycardia, unspecified: Secondary | ICD-10-CM

## 2014-01-03 NOTE — Progress Notes (Signed)
Subjective:    Patient ID: Shannon [name], female    DOB: 09-26-1965, 48 y.o.   MRN: [id number]  HPI  I last saw the patient in September. At that time she had recently and admitted to the hospital with SIRS and sepsis and fever after a hysterectomy. She had a hysterectomy due to ovarian cancer. Afterwards she had free fluid in the pelvis which seems to be the source of infection. She was treated with antibiotics. Since discharge from the hospital she completed the antibiotics in September. She's had no further episodes of fever or sickness. At the time from discharge from the hospital she was anemic. This was attributed to be due to vaginal bleeding and surgery. After taking iron, her hemoglobin has risen to 13.6. She is no longer on iron supplementation. Her tachycardia has also improved. She had a stress test performed with cardiologist which was normal. Her heart rate is currently 78 bpm on carvedilol. She denies any chest pain or shortness of breath. She does complain of fatigue. When I saw the patient in September, her calcium was mildly elevated. We are due to recheck that again today. Unfortunately the patient complains of depression. She also reports anhedonia and insomnia. She reports anxiety but denies any panic attacks or suicidal ideation. She is tearful today on our office visit. Past Medical History  Diagnosis Date  . PONV (postoperative nausea and vomiting)   . Hypertension     not taking med x1 yr. couldn't afford.  Marland Kitchen UTI (lower urinary tract infection)     history of 1 month ago  . Cancer     Uterine cancer dx. surgery planned TAH  . Uterine cancer   . Ovarian cancer    Past Surgical History  Procedure Laterality Date  . Cholecystectomy      laparoscopic  . Robotic assisted total hysterectomy with bilateral salpingo oopherectomy Bilateral 09/28/2013    Procedure: ROBOTIC ASSISTED TOTAL HYSTERECTOMY WITH BILATERAL SALPINGO OOPHORECTOMY WITH  LYMPH NODE DISECTION, ;   Surgeon: Everitt Amber, MD;  Location: [facility];  Service: Gynecology;  Laterality: Bilateral;   Current Outpatient Prescriptions on File Prior to Visit  Medication Sig Dispense Refill  . carvedilol (COREG) 6.25 MG tablet Take 2 tablets (12.5 mg total) by mouth 2 (two) times daily with a meal. 120 tablet 3  . docusate sodium (STOOL SOFTENER) 100 MG capsule Take 100 mg by mouth daily as needed for mild constipation.    Marland Kitchen estradiol (ESTRACE) 2 MG tablet Take 1 tablet (2 mg total) by mouth daily. 30 tablet 7  . estrogens, conjugated, (PREMARIN) 0.3 MG tablet Take 1 tablet (0.3 mg total) by mouth daily. Take daily for 21 days then do not take for 7 days. 30 tablet 11  . ferrous sulfate 325 (65 FE) MG tablet Take 1 tablet (325 mg total) by mouth 3 (three) times daily with meals.  3   No current facility-administered medications on file prior to visit.   Allergies  Allergen Reactions  . Contrast Media [Iodinated Diagnostic Agents] Anaphylaxis    Swelling of the throat, difficulty breathing  . Tetanus Toxoids     Swelling at site, lethargic x few days   History   Social History  . Marital Status: Single    Spouse Name: N/A    Number of Children: N/A  . Years of Education: N/A   Occupational History  . Not on file.   Social History Main Topics  . Smoking status: Never Smoker   .  Smokeless tobacco: Never Used  . Alcohol Use: No  . Drug Use: No  . Sexual Activity: Not Currently   Other Topics Concern  . Not on file   Social History Narrative     Review of Systems  All other systems reviewed and are negative.      Objective:   Physical Exam  Constitutional: She appears well-developed and well-nourished.  Cardiovascular: Normal rate, regular rhythm and normal heart sounds.   No murmur heard. Pulmonary/Chest: Effort normal and breath sounds normal. No respiratory distress. She has no wheezes. She has no rales.  Abdominal: Soft. Bowel sounds are normal. She exhibits no distension.  There is no tenderness. There is no rebound and no guarding.  Musculoskeletal: She exhibits no edema.  Vitals reviewed.         Assessment & Plan:  Hypercalcemia - Plan: COMPLETE METABOLIC PANEL WITH GFR  Depression  Tachycardia  Patient's tachycardia has resolved. I asked her to continue the carvedilol. Her anemia was stable. She is no longer on iron. Her calcium was elevated at the last office visit and therefore I will check her calcium level again today if it is elevated I will check a PTH.  Patient has depression today. I offered her treatment with SSRI but she declined treatment now. She will consider it. I did recommend therapeutic lifestyle changes including diet exercise and weight loss given her obesity.

## 2014-01-04 LAB — COMPLETE METABOLIC PANEL WITH GFR
ALBUMIN: 4.5 g/dL (ref 3.5–5.2)
ALK PHOS: 73 U/L (ref 39–117)
ALT: 13 U/L (ref 0–35)
AST: 16 U/L (ref 0–37)
BILIRUBIN TOTAL: 0.6 mg/dL (ref 0.2–1.2)
BUN: 8 mg/dL (ref 6–23)
CALCIUM: 10.5 mg/dL (ref 8.4–10.5)
CO2: 28 mEq/L (ref 19–32)
CREATININE: 0.59 mg/dL (ref 0.50–1.10)
Chloride: 101 mEq/L (ref 96–112)
Glucose, Bld: 82 mg/dL (ref 70–99)
Potassium: 4.3 mEq/L (ref 3.5–5.3)
Sodium: 140 mEq/L (ref 135–145)
Total Protein: 7 g/dL (ref 6.0–8.3)

## 2014-01-07 ENCOUNTER — Other Ambulatory Visit: Payer: BC Managed Care – PPO

## 2014-01-07 DIAGNOSIS — R300 Dysuria: Secondary | ICD-10-CM

## 2014-01-07 LAB — URINALYSIS, ROUTINE W REFLEX MICROSCOPIC
Bilirubin Urine: NEGATIVE
Glucose, UA: NEGATIVE mg/dL
HGB URINE DIPSTICK: NEGATIVE
KETONES UR: NEGATIVE mg/dL
Leukocytes, UA: NEGATIVE
NITRITE: NEGATIVE
PROTEIN: NEGATIVE mg/dL
Specific Gravity, Urine: 1.025 (ref 1.005–1.030)
Urobilinogen, UA: 0.2 mg/dL (ref 0.0–1.0)
pH: 5.5 (ref 5.0–8.0)

## 2014-01-10 LAB — URINE CULTURE: Colony Count: 50000

## 2014-01-14 ENCOUNTER — Telehealth: Payer: Self-pay | Admitting: Gynecologic Oncology

## 2014-01-14 NOTE — Telephone Encounter (Signed)
Office Visit:  GYN ONCOLOGY   XN:ATFTD IA G1 endometrial cancer Stage I low grade ovarian cancer  Surveillance  Assessment:    48 y.o. 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   S/p robotic hysterectomy, BSO and lymphadenectomy on 09/28/13. no LVSI, no myometrial invasion, negative pelvic washings and negative lymph nodes. Ovarian cancer was low grade, with no capsular involvement. Vulvovaginal candidiasis. History of postoperative fever.  Plan:check  HPI:  Shannon [name] is a 48 y.o. year old. initially seen in consultation on 09/02/13 for grade 1 endometrial cancer.  She then underwent a robotic hysterectomy, BSO and lymphadenectomy on 03/30/18 without complications.  Her postoperative course was uncomplicated and she was discharged on postoperative day 1.  Her final pathologic diagnosis is a Stage IA Grade 1 endometrioid endometrial cancer with no lymphovascular space invasion, no myometrial invasion and negative lymph nodes. 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  She was seen on 10/08/13 for a postoperative check and to discuss her pathology results and treatment plan. She was found to be febrile with abdominal pain and was admitted to the hospital for further workup. A CT of the abdomen and pelvis was negative for abscess or pathology (it showed only fluid consistent with lymphatic fluid s/p lymphadenectomy).  Past Medical History  Diagnosis Date  . PONV (postoperative nausea and vomiting)   . Hypertension     not taking med x1 yr. couldn't afford.  Marland Kitchen UTI (lower urinary tract infection)     history of 1 month ago  . Cancer     Uterine cancer dx. surgery planned TAH  . Uterine cancer   . Ovarian cancer    Past Surgical History  Procedure Laterality Date  .  Cholecystectomy      laparoscopic  . Robotic assisted total hysterectomy with bilateral salpingo oopherectomy Bilateral 09/28/2013    Procedure: ROBOTIC ASSISTED TOTAL HYSTERECTOMY WITH BILATERAL SALPINGO OOPHORECTOMY WITH  LYMPH NODE DISECTION, ;  Surgeon: Everitt Amber, MD;  Location: [facility];  Service: Gynecology;  Laterality: Bilateral;  No family history on file.  History   Social History  . Marital Status: Single    Spouse Name: N/A    Number of Children: N/A  . Years of Education: N/A   Social History Main Topics  . Smoking status: Never Smoker   . Smokeless tobacco: Never Used  . Alcohol Use: No  . Drug Use: No  . Sexual Activity: Not Currently   Other Topics Concern  . Not on file   Social History Narrative    Review of systems: Constitutional:  She has no weight gain or weight loss. No fever or chills.  Eyes: No blurred vision Ears, Nose, Mouth, Throat: No dizziness, headaches or changes in hearing. No mouth sores. Cardiovascular: No chest pain, palpitations or edema. Respiratory:  No shortness of breath, wheezing or cough Gastrointestinal: She has normal bowel movements without diarrhea or constipation. She denies any nausea or vomiting. She denies blood in her stool or heart burn. Genitourinary:  See HPI, She has no hematuria, but does have dysuria, though no incontinence.  Musculoskeletal: Denies muscle weakness or joint pains.  Skin:  She has no skin changes, rashes or itching Neurological:  Denies dizziness or headaches. No neuropathy, no numbness or tingling. Psychiatric:  She denies depression or anxiety.  Hematologic/Lymphatic:   No easy bruising or bleeding   Physical Exam: Last menstrual period 08/06/2013. General: Well dressed, well nourished in no apparent distress.   HEENT:  Normocephalic and atraumatic, no lesions.  Extraocular muscles intact. Sclerae anicteric. Pupils equal, round, reactive. No mouth sores or ulcers. Thyroid is normal size, not nodular,  midline. Skin:  No lesions or rashes. Breasts:  Soft, symmetric.  No skin or nipple changes.  No palpable LN or masses. Lungs:  Clear to auscultation bilaterally.  No wheezes. Cardiovascular:  Regular rate and rhythm.  No murmurs or rubs. Mildly tachycardic Abdomen:  Soft, generally tender, nondistended but obese.  No palpable masses.  No hepatosplenomegaly.  No ascites. Normal bowel sounds.  No hernias.  Incisions are intact, not draining, healing normally with no signs of cellulitis. The suprapubic incision is also healing well with no cellulitis or drainage.  Genitourinary: Normal EGBUS  Vaginal cuff intact.  No bleeding or discharge, no visible cuff cellulitis.  There is erythema of the vagina and vulva consistent with candidiasis. There are no lesions or separations of the cuff. Extremities: No cyanosis, clubbing or edema.  No calf tenderness or erythema. No palpable cords. Psychiatric: Mood and affect are appropriate. Neurological: Awake, alert and oriented x 3. Sensation is intact, no neuropathy.  Musculoskeletal: No pain, normal strength and range of motion.

## 2014-01-17 ENCOUNTER — Encounter: Payer: Self-pay | Admitting: Gynecologic Oncology

## 2014-01-17 ENCOUNTER — Ambulatory Visit: Payer: BC Managed Care – PPO | Attending: Gynecologic Oncology | Admitting: Gynecologic Oncology

## 2014-01-17 VITALS — BP 137/104 | HR 78 | Temp 98.4°F | Resp 19 | Ht 62.0 in | Wt 270.8 lb

## 2014-01-17 DIAGNOSIS — C569 Malignant neoplasm of unspecified ovary: Secondary | ICD-10-CM | POA: Insufficient documentation

## 2014-01-17 DIAGNOSIS — C541 Malignant neoplasm of endometrium: Secondary | ICD-10-CM | POA: Insufficient documentation

## 2014-01-17 DIAGNOSIS — Z8543 Personal history of malignant neoplasm of ovary: Secondary | ICD-10-CM

## 2014-01-17 DIAGNOSIS — R300 Dysuria: Secondary | ICD-10-CM

## 2014-01-17 DIAGNOSIS — Z8542 Personal history of malignant neoplasm of other parts of uterus: Secondary | ICD-10-CM

## 2014-01-17 DIAGNOSIS — N390 Urinary tract infection, site not specified: Secondary | ICD-10-CM | POA: Diagnosis not present

## 2014-01-17 MED ORDER — CIPROFLOXACIN HCL 500 MG PO TABS: 500.0000 mg | ORAL_TABLET | Freq: Two times a day (BID) | ORAL | Status: DC

## 2014-01-17 NOTE — Patient Instructions (Addendum)
Plan to follow up with Dr. Paula Compton in three months and Dr. Denman George in six months.  Please call for any questions or concerns.  You will receive a phone call from the [facility] about arranging for a genetics appointment.  Happy Holidays.  Ciprofloxacin tablets What is this medicine? CIPROFLOXACIN (sip roe FLOX a sin) is a quinolone antibiotic. It is used to treat certain kinds of bacterial infections. It will not work for colds, flu, or other viral infections. This medicine may be used for other purposes; ask your health care provider or pharmacist if you have questions. COMMON BRAND NAME(S): Cipro What should I tell my health care provider before I take this medicine? They need to know if you have any of these conditions: -bone problems -cerebral disease -joint problems -irregular heartbeat -kidney disease -liver disease -myasthenia gravis -seizure disorder -tendon problems -an unusual or allergic reaction to ciprofloxacin, other antibiotics or medicines, foods, dyes, or preservatives -pregnant or trying to get pregnant -breast-feeding How should I use this medicine? Take this medicine by mouth with a glass of water. Follow the directions on the prescription label. Take your medicine at regular intervals. Do not take your medicine more often than directed. Take all of your medicine as directed even if you think your are better. Do not skip doses or stop your medicine early. You can take this medicine with food or on an empty stomach. It can be taken with a meal that contains dairy or calcium, but do not take it alone with a dairy product, like milk or yogurt or calcium-fortified juice. A special MedGuide will be given to you by the pharmacist with each prescription and refill. Be sure to read this information carefully each time. Talk to your pediatrician regarding the use of this medicine in children. Special care may be needed. Overdosage: If you think you have taken too much  of this medicine contact a poison control center or emergency room at once. NOTE: This medicine is only for you. Do not share this medicine with others. What if I miss a dose? If you miss a dose, take it as soon as you can. If it is almost time for your next dose, take only that dose. Do not take double or extra doses. What may interact with this medicine? Do not take this medicine with any of the following medications: -cisapride -droperidol -terfenadine -tizanidine This medicine may also interact with the following medications: -antacids -birth control pills -caffeine -cyclosporin -didanosine (ddI) buffered tablets or powder -medicines for diabetes -medicines for inflammation like ibuprofen, naproxen -methotrexate -multivitamins -omeprazole -phenytoin -probenecid -sucralfate -theophylline -warfarin This list may not describe all possible interactions. Give your health care provider a list of all the medicines, herbs, non-prescription drugs, or dietary supplements you use. Also tell them if you smoke, drink alcohol, or use illegal drugs. Some items may interact with your medicine. What should I watch for while using this medicine? Tell your doctor or health care professional if your symptoms do not improve. Do not treat diarrhea with over the counter products. Contact your doctor if you have diarrhea that lasts more than 2 days or if it is severe and watery. You may get drowsy or dizzy. Do not drive, use machinery, or do anything that needs mental alertness until you know how this medicine affects you. Do not stand or sit up quickly, especially if you are an older patient. This reduces the risk of dizzy or fainting spells. This medicine can make you more  sensitive to the sun. Keep out of the sun. If you cannot avoid being in the sun, wear protective clothing and use sunscreen. Do not use sun lamps or tanning beds/booths. Avoid antacids, aluminum, calcium, iron, magnesium, and zinc  products for 6 hours before and 2 hours after taking a dose of this medicine. What side effects may I notice from receiving this medicine? Side effects that you should report to your doctor or health care professional as soon as possible: - allergic reactions like skin rash, itching or hives, swelling of the face, lips, or tongue - breathing problems - confusion, nightmares or hallucinations - feeling faint or lightheaded, falls - irregular heartbeat - joint, muscle or tendon pain or swelling - pain or trouble passing urine -persistent headache with or without blurred vision - redness, blistering, peeling or loosening of the skin, including inside the mouth - seizure - unusual pain, numbness, tingling, or weakness Side effects that usually do not require medical attention (report to your doctor or health care professional if they continue or are bothersome): - diarrhea - nausea or stomach upset - white patches or sores in the mouth This list may not describe all possible side effects. Call your doctor for medical advice about side effects. You may report side effects to FDA at 1-800-FDA-1088. Where should I keep my medicine? Keep out of the reach of children. Store at room temperature below 30 degrees C (86 degrees F). Keep container tightly closed. Throw away any unused medicine after the expiration date. NOTE: This sheet is a summary. It may not cover all possible information. If you have questions about this medicine, talk to your doctor, pharmacist, or health care provider.  2015, [employer]vier/Gold Standard. (2012-08-20 16:10:46)

## 2014-01-17 NOTE — Progress Notes (Signed)
Office Visit:  GYN ONCOLOGY   NU:UVOZD IA G1 endometrial cancer Stage I low grade ovarian cancer  Surveillance  Assessment:   48 y.o. 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   S/p robotic hysterectomy, BSO and lymphadenectomy on 09/28/13. no LVSI, no myometrial invasion, negative pelvic washings and negative lymph nodes. Ovarian cancer was low grade, with no capsular involvement. Vulvovaginal candidiasis. History of postoperative fever.  Plan:  Follow-up with Dr. Marvel Plan in 3 months F/U with Dr. Denman George in 6 months Counseled on the signs and symptoms of recurrent disease  Referred for genetic counseling.  UTI Rx Cipro  HPI:  Shannon [name] is a 48 y.o. year old. initially seen in consultation on 09/02/13 for grade 1 endometrial cancer.  She then underwent a robotic hysterectomy, BSO and lymphadenectomy on 07/03/44 without complications.  Her postoperative course was uncomplicated and she was discharged on postoperative day 1.  Her final pathologic diagnosis is a Stage IA Grade 1 endometrioid endometrial cancer with no lymphovascular space invasion, no myometrial invasion and negative lymph nodes. 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  She was seen on 10/08/13 for a postoperative check and to discuss her pathology results and treatment plan. She was found to be febrile with abdominal pain and was admitted to the hospital for further workup. A CT of the abdomen and pelvis was negative for abscess or pathology (it showed only fluid consistent with lymphatic fluid s/p lymphadenectomy).  Past Medical History  Diagnosis Date  . PONV (postoperative nausea and vomiting)   . Hypertension     not taking med x1 yr. couldn't afford.  Marland Kitchen UTI (lower urinary tract infection)     history  of 1 month ago  . Cancer     Uterine cancer dx. surgery planned TAH  . Uterine cancer   . Ovarian cancer    Past Surgical History  Procedure Laterality Date  . Cholecystectomy      laparoscopic  . Robotic assisted total hysterectomy with bilateral salpingo oopherectomy Bilateral 09/28/2013    Procedure: ROBOTIC ASSISTED TOTAL HYSTERECTOMY WITH BILATERAL SALPINGO OOPHORECTOMY WITH  LYMPH NODE DISECTION, ;  Surgeon: Everitt Amber, MD;  Location: [facility];  Service: Gynecology;  Laterality: Bilateral;  History reviewed. No pertinent family history.  History   Social History  . Marital Status: Single    Spouse Name: N/A    Number of Children: N/A  . Years of Education: N/A   Social History Main Topics  . Smoking status: Never Smoker   . Smokeless tobacco: Never Used  . Alcohol Use: No  . Drug Use: No  . Sexual Activity: Not Currently   Other Topics Concern  . None   Social History Narrative    Review of systems: Constitutional:  She has no weight gain or weight loss. No fever or chills.  Eyes: No blurred vision Ears, Nose, Mouth, Throat: No dizziness, headaches or changes in hearing. No mouth sores. Cardiovascular: No chest pain, palpitations or edema. Respiratory:  No shortness of breath, wheezing or cough Gastrointestinal: She has normal bowel movements without diarrhea or constipation. She denies any nausea or vomiting. She denies blood in her stool or heart burn. Genitourinary:  See HPI, She has no hematuria, but does have dysuria Musculoskeletal: Denies muscle weakness or joint pains.  Skin:  She has  no skin changes, rashes or itching Neurological:  Denies dizziness or headaches. No neuropathy, no numbness or tingling. Psychiatric:  She denies depression or anxiety. Hematologic/Lymphatic:   No easy bruising or bleeding   Physical Exam: Blood pressure 137/104, pulse 78, temperature 98.4 F (36.9 C), resp. rate 19, height 5\' 2"  (1.575 m), weight 270 lb 12.8 oz (122.834  kg), last menstrual period 08/06/2013. Body mass index is 49.52 kg/(m^2).  General: Well dressed, well nourished in no apparent distress.   HEENT:  Normocephalic and atraumatic, no lesions.  Extraocular muscles intact. Sclerae anicteric. Pupils equal, round, reactive.  Skin:  No lesions or rashes. Lungs:  Clear to auscultation bilaterally.  No wheezes. Cardiovascular:  Regular rate and rhythm.  No murmurs or rubs. Mildly tachycardic Abdomen:  Soft, generally tender, nondistended but obese.  No palpable masses.  No hepatosplenomegaly.  No ascites. Normal bowel sounds.  No hernias.  Incisions are intact, not draining, healing normally with no signs of cellulitis. Genitourinary: Normal EGBUS  Vaginal cuff intact.  No bleeding or discharge, no visible cuff cellulitis.  There are no lesions or separations of the cuff. Rectal:  Good tone, no masses. Extremities: No cyanosis, clubbing or edema.  No calf tenderness or erythema. No palpable cords. Psychiatric: Mood and affect are appropriate. Neurological: Awake, alert and oriented x 3. Sensation is intact, no neuropathy.  Musculoskeletal: No pain, normal strength and range of motion.

## 2014-01-27 ENCOUNTER — Telehealth: Payer: Self-pay | Admitting: Genetic Counselor

## 2014-01-27 NOTE — Telephone Encounter (Signed)
S/W PATIENT AND GAVE NP GENETIC APPT FOR 01/14 @ 1:30 W/GENETIC COUSELOR.

## 2014-02-10 ENCOUNTER — Other Ambulatory Visit: Payer: BC Managed Care – PPO

## 2014-02-10 ENCOUNTER — Ambulatory Visit: Payer: BC Managed Care – PPO

## 2014-02-10 DIAGNOSIS — C541 Malignant neoplasm of endometrium: Secondary | ICD-10-CM

## 2014-02-10 DIAGNOSIS — C562 Malignant neoplasm of left ovary: Secondary | ICD-10-CM

## 2014-02-14 DIAGNOSIS — C569 Malignant neoplasm of unspecified ovary: Secondary | ICD-10-CM | POA: Insufficient documentation

## 2014-02-14 DIAGNOSIS — C541 Malignant neoplasm of endometrium: Secondary | ICD-10-CM | POA: Insufficient documentation

## 2014-02-14 NOTE — Progress Notes (Unsigned)
[facility] Patient Visit  REFERRING PROVIDER: Susy Frizzle, MD [address] [facility] [place], [place] [address]  PRIMARY PROVIDER:  Odette Fraction, MD  PRIMARY REASON FOR VISIT:  1. Endometrial adenocarcinoma   2. Ovarian ca, left     HISTORY OF PRESENT ILLNESS:   Shannon [name], a 49 y.o. female, was seen for a [facility] cancer genetics consultation at the request of Dr. Dennard Schaumann due to a personal and family history of cancer.  Shannon [name] presents to clinic today to discuss the possibility of a hereditary predisposition to cancer, genetic testing, and to further clarify her future cancer risks, as well as potential cancer risks for family members.   CANCER HISTORY:  49 y.o. 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 S/p robotic hysterectomy, BSO and lymphadenectomy on 09/28/13. no LVSI, no myometrial invasion, negative pelvic washings and negative lymph nodes. Ovarian cancer was low grade, with no capsular involvement.   Past Medical History  Diagnosis Date   PONV (postoperative nausea and vomiting)    Hypertension     not taking med x1 yr. couldn't afford.   UTI (lower urinary tract infection)     history of 1 month ago   Cancer     Uterine cancer dx. surgery planned TAH   Uterine cancer    Ovarian cancer     Past Surgical History  Procedure Laterality Date   Cholecystectomy      laparoscopic   Robotic assisted total hysterectomy with bilateral salpingo oopherectomy Bilateral 09/28/2013    Procedure: ROBOTIC ASSISTED TOTAL HYSTERECTOMY WITH BILATERAL SALPINGO OOPHORECTOMY WITH  LYMPH NODE DISECTION, ;  Surgeon: Everitt Amber, MD;  Location: [facility];  Service: Gynecology;  Laterality: Bilateral;    History   Social History   Marital Status: Single    Spouse Name: N/A    Number of Children: N/A   Years of Education: N/A   Social History Main  Topics   Smoking status: Never Smoker    Smokeless tobacco: Never Used   Alcohol Use: No   Drug Use: No   Sexual Activity: Not Currently   Other Topics Concern   Not on file   Social History Narrative     FAMILY HISTORY:  During the visit, a 4-generation pedigree was obtained. A copy of the pedigree with be scanned into Epic under the Media tab. Significant family history diagnoses include the following: Family History  Problem Relation Age of Onset   Cancer Maternal Uncle 20    unknown type of cancer   Cancer Paternal Aunt 75    bone cancer   Cancer Maternal Uncle 75    colorectal cancer   Cancer Maternal Uncle 75    throat cancer, smoker   Cancer Cousin 67    mat first cousin with throat cancer    Shannon [name]'s ancestry is of Caucasian descent. There is no known Jewish ancestry or consanguinity.  GENETIC COUNSELING ASSESSMENT:  Shannon [name] is a 49 y.o. female with a personal and family history of cancer suggestive of a hereditary predisposition to cancer. We, therefore, discussed and recommended the following at today's visit.   DISCUSSION:  We reviewed the characteristics, features and inheritance patterns of hereditary cancer syndromes including Lynch syndrome and hereditary conditions associated with and increase risk for ovarian cancer. We also discussed genetic testing, including the appropriate family members to test, the process of testing,  insurance coverage and turn-around-time for results. We discussed the implications of a negative, positive and/or variant of uncertain significant result. We recommended Shannon [name] pursue genetic testing for the OvaNext gene panel. The OvaNext gene panel offered by [employer] includes sequencing and rearrangement analysis for the following 24 genes:ATM, BARD1, BRCA1, BRCA2, BRIP1, CDH1, CHEK2, EPCAM, MLH1, MRE11A, MSH2, MSH6, MUTYH, NBN, NF1, PALB2, PMS2, PTEN, RAD50, RAD51C, RAD51D, SMARCA4, STK11, and TP53.  PLAN:   Based on our above recommendation, Shannon [name] wished to pursue genetic testing and the blood sample was drawn and will be sent to [facility] for analysis. Results should be available within approximately 6 weeks time, at which point they will be disclosed by telephone to Shannon [name], as will any additional recommendations warranted by these results. Lastly, we encouraged Shannon [name] to remain in contact with cancer genetics annually so that we can continuously update the family history and inform her of any changes in cancer genetics and testing that may be of benefit for this family.   Ms.  [name] questions were answered to her satisfaction today. Our contact information was provided should additional questions or concerns arise. Thank you for the referral and allowing [place] to share in the care of your patient.   Catherine A. Fine, MS, CGC Certified Psychologist, [email or web address] phone: [phone]  The patient was seen for a total of 45 minutes in face-to-face genetic counseling.  This patient was discussed with Dr. Jana Hakim who agrees with the above.    ______________________________________________________________________ For Office Staff:  Number of people involved in session including genetic counselor: 2 Was an intern or student involved with case: not applicable

## 2014-03-09 ENCOUNTER — Encounter: Payer: Self-pay | Admitting: Genetic Counselor

## 2014-03-09 DIAGNOSIS — C562 Malignant neoplasm of left ovary: Secondary | ICD-10-CM

## 2014-03-09 DIAGNOSIS — C541 Malignant neoplasm of endometrium: Secondary | ICD-10-CM

## 2014-03-09 NOTE — Progress Notes (Signed)
[facility]  Genetic Test Results   PRIMARY PROVIDER:  Odette Fraction, MD  PRIMARY REASON FOR VISIT:  Patient Active Problem List   Diagnosis Date Noted   Endometrial adenocarcinoma 02/14/2014   Ovarian ca 02/14/2014    GENETIC TEST RESULT:  Testing Laboratory: [facility]  Test Ordered: OvaNext gene panel Date of Report: 03/04/14 Result: Normal, no pathogenic mutations identified General Interpretation: Reassuring  HPI: Ms. Rallis was previously seen in the [facility] due to concerns regarding a hereditary predisposition to cancer. Please refer to our prior cancer genetics clinic note for more information regarding Ms. Wartman's medical, social and family histories, and our assessment and recommendations, at the time. Ms. Rosello genetic test results and recommendations warranted by these results were recently disclosed to her and are discussed in more detail below.  GENETIC TEST RESULTS: At the time of Ms. Maugeri visit, we recommended she pursue genetic testing, which includes sequencing and deletion/duplication analysis of several genes associated with an increased risk for cancer via a gene panel called "OvaNext". The OvaNext gene panel offered by [employer] includes sequencing and rearrangement analysis for the following 24 genes:ATM, BARD1, BRCA1, BRCA2, BRIP1, CDH1, CHEK2, EPCAM, MLH1, MRE11A, MSH2, MSH6, MUTYH, NBN, NF1, PALB2, PMS2, PTEN, RAD50, RAD51C, RAD51D, SMARCA4, STK11, and TP53.  Genetic testing for this gene panel was normal and did not reveal a pathogenic mutation in any of these genes. A copy of the genetic test report will be scanned into Epic under the media tab.   We discussed with Ms. Bayon that current genetic testing is not perfect, and it is, therefore, possible there may be a pathogenic gene mutation in one of these genes that current testing cannot detect, but that chance is  small.  We also discussed, that it is possible that another gene that has not yet been discovered, or that we have not yet tested, is responsible for the cancer diagnoses in her family. It is, therefore, important for Ms. Ditommaso to continue to remain in touch with cancer genetics so that we can continue to offer Ms. Cale the most up to date genetic testing.   CANCER SCREENING RECOMMENDATIONS:This result is reassuring and indicates that Ms. Lindvall likely does not have an increased risk for a future cancer due to a mutation in one of these genes. This normal test also suggests that Ms. Buysse's cancer was most likely not due to an inherited predisposition associated with one of these genes.  Most cancers happen by chance and this negative test suggests that her cancer falls into this category.  We, therefore, recommended she continue to follow the cancer management and screening guidelines provided by her oncology and primary healthcare provider.   RECOMMENDATIONS FOR FAMILY MEMBERS:  Individuals in this family might be at some increased risk of developing cancer, over the general population risk, simply due to the family history of cancer.  We recommended women in this family have a yearly mammogram beginning at age 51, an annual clinical breast exam, and perform monthly breast self-exams. Women in this family should also have a gynecological exam as recommended by their primary provider. All family members should have a colonoscopy by age 48, an annual dermatological exam and continue to follow cancer screening guidelines recommended by their healthcare provider.  FOLLOW-UP: Lastly, we discussed with Ms. Seldon that cancer genetics is a rapidly advancing field and it is likely that new genetic tests will be appropriate for her  and/or family members in the future. We encouraged her to remain in contact with cancer genetics on an annual basis so we can update her personal and family histories and let her  know of advances in cancer genetics that may benefit this family.   Our contact number was provided. Ms. Schoenfelder questions were answered to her satisfaction, and she knows she is welcome to call us at anytime with additional questions or concerns.    Catherine A. Fine, MS, CGC Certified Psychologist, [email or web address] Phone: [phone]

## 2014-03-11 ENCOUNTER — Other Ambulatory Visit: Payer: Self-pay | Admitting: Family Medicine

## 2014-03-13 ENCOUNTER — Other Ambulatory Visit: Payer: Self-pay | Admitting: Family Medicine

## 2014-04-01 ENCOUNTER — Encounter: Payer: Self-pay | Admitting: Oncology

## 2014-04-15 ENCOUNTER — Encounter: Payer: Self-pay | Admitting: Cardiovascular Disease

## 2014-04-15 ENCOUNTER — Ambulatory Visit (INDEPENDENT_AMBULATORY_CARE_PROVIDER_SITE_OTHER): Payer: BC Managed Care – PPO | Admitting: Cardiovascular Disease

## 2014-04-15 VITALS — BP 112/82 | HR 76 | Resp 16 | Ht 62.0 in | Wt 268.1 lb

## 2014-04-15 DIAGNOSIS — R000 Tachycardia, unspecified: Secondary | ICD-10-CM

## 2014-04-15 NOTE — Patient Instructions (Signed)
Your physician wants you to follow-up in: 1 Year. You will receive a reminder letter in the mail two months in advance. If you don't receive a letter, please call our office to schedule the follow-up appointment.  

## 2014-04-17 ENCOUNTER — Encounter: Payer: Self-pay | Admitting: Cardiovascular Disease

## 2014-04-17 NOTE — Progress Notes (Signed)
Patient ID: Shannon [name], female   DOB: [birth year], 49 y.o.   MRN: [id number]      Cardiology Office Note   Date:  04/17/2014   ID:  Shannon [name], DOB [birth year], MRN [id number]  PCP:  Odette Fraction, MD  Cardiologist:   Sanda Klein, MD   Chief Complaint  Patient presents with  . Follow-up    No complaints of chest pain, SOB, edema or dizziness.      History of Present Illness: Shannon [name] is a 49 y.o. female who presents for follow up after development of tachycardia and abnormal ecg during a hospitalization for surgery. Her workup demonstrated mild LVH, probably HTN related, that could explain her ECG changes. These changes are persistent and are not dynamic (anterior T wave inversion).  Her stress nuclear study showed "Low risk stress nuclear study. Mild anteroseptal ischemia." LVEF is preserved. She has never had cardiac symptoms. LVEF is normal. On my review of the images, the defect is clearly mild, small in extent (<4% LV myocardium) and could well be explained by "shifting breast" attenuation artifact.  She remains asymptomatic. Her tachycardia resolved with her anemia.    Past Medical History  Diagnosis Date  . PONV (postoperative nausea and vomiting)   . Hypertension     not taking med x1 yr. couldn't afford.  Marland Kitchen UTI (lower urinary tract infection)     history of 1 month ago  . Cancer     Uterine cancer dx. surgery planned TAH  . Uterine cancer   . Ovarian cancer     Past Surgical History  Procedure Laterality Date  . Cholecystectomy      laparoscopic  . Robotic assisted total hysterectomy with bilateral salpingo oopherectomy Bilateral 09/28/2013    Procedure: ROBOTIC ASSISTED TOTAL HYSTERECTOMY WITH BILATERAL SALPINGO OOPHORECTOMY WITH  LYMPH NODE DISECTION, ;  Surgeon: Everitt Amber, MD;  Location: [facility];  Service: Gynecology;  Laterality: Bilateral;     Current Outpatient Prescriptions  Medication Sig Dispense Refill  . carvedilol (COREG)  6.25 MG tablet TAKE TWO TABLETS BY MOUTH TWICE DAILY WITH  A  MEAL 120 tablet 8  . docusate sodium (STOOL SOFTENER) 100 MG capsule Take 100 mg by mouth daily as needed for mild constipation.    Marland Kitchen estradiol (ESTRACE) 2 MG tablet Take 1 tablet (2 mg total) by mouth daily. 30 tablet 7   No current facility-administered medications for this visit.    Allergies:   Contrast media and Tetanus toxoids    Social History:  The patient  reports that she has never smoked. She has never used smokeless tobacco. She reports that she does not drink alcohol or use illicit drugs.   Family History:  The patient's family history includes Cancer (age of onset: 49) in her cousin; Cancer (age of onset: 19) in her maternal uncle; Cancer (age of onset: 18) in her maternal uncle, maternal uncle, and paternal aunt.    ROS:  Please see the history of present illness.  The patient specifically denies any chest pain at rest or with exertion, dyspnea at rest or with exertion, orthopnea, paroxysmal nocturnal dyspnea, syncope, palpitations, focal neurological deficits, intermittent claudication, lower extremity edema, unexplained weight gain, cough, hemoptysis or wheezing.  The patient also denies abdominal pain, nausea, vomiting, dysphagia, diarrhea, constipation, polyuria, polydipsia, dysuria, hematuria, frequency, urgency, abnormal bleeding or bruising, fever, chills, unexpected weight changes, mood swings, change in skin or hair texture, change in voice quality, auditory or visual problems, allergic reactions or  rashes, new musculoskeletal complaints other than usual "aches and pains".    Otherwise, review of systems positive for none.   All other systems are reviewed and negative.    PHYSICAL EXAM: VS:  BP 112/82 mmHg  Pulse 76  Resp 16  Ht 5\' 2"  (1.575 m)  Wt 268 lb 1.6 oz (121.609 kg)  BMI 49.02 kg/m2  LMP 08/06/2013 , BMI Body mass index is 49.02 kg/(m^2).  General: Alert, oriented x3, no distress Head: no  evidence of trauma, PERRL, EOMI, no exophtalmos or lid lag, no myxedema, no xanthelasma; normal ears, nose and oropharynx Neck: normal jugular venous pulsations and no hepatojugular reflux; brisk carotid pulses without delay and no carotid bruits Chest: clear to auscultation, no signs of consolidation by percussion or palpation, normal fremitus, symmetrical and full respiratory excursions Cardiovascular: normal position and quality of the apical impulse, regular rhythm, normal first and second heart sounds, no murmurs, rubs or gallops Abdomen: no tenderness or distention, no masses by palpation, no abnormal pulsatility or arterial bruits, normal bowel sounds, no hepatosplenomegaly Extremities: no clubbing, cyanosis or edema; 2+ radial, ulnar and brachial pulses bilaterally; 2+ right femoral, posterior tibial and dorsalis pedis pulses; 2+ left femoral, posterior tibial and dorsalis pedis pulses; no subclavian or femoral bruits Neurological: grossly nonfocal Psych: euthymic mood, full affect   EKG:  EKG is ordered today. The ekg ordered today demonstrates NSR, T wave inversion V1-V4, unchanged   Recent Labs: 10/08/2013: Magnesium 1.7 10/09/2013: TSH 2.650 11/19/2013: Hemoglobin 13.6; Platelets 270 01/03/2014: ALT 13; BUN 8; Creatinine 0.59; Potassium 4.3; Sodium 140     Wt Readings from Last 3 Encounters:  04/15/14 268 lb 1.6 oz (121.609 kg)  01/17/14 270 lb 12.8 oz (122.834 kg)  01/03/14 273 lb (123.832 kg)      ASSESSMENT AND PLAN:  [name] has a low risk profile for CAD and young age and is free of angina. She has an abnormal ECG and possible apical ischemia by nuclear stress testing. The defect is low risk and LVEF is normal.  Recommend conservative therapy, stay on beta blocker. Call if she develops chest discomfort or dyspnea.  Check lipids. Questionable benefit from aspirin.  Current medicines are reviewed at length with the patient today.  The patient does not have  concerns regarding medicines.  The following changes have been made:  no change  Labs/ tests ordered today include:  Orders Placed This Encounter  Procedures  . EKG 12-Lead   Patient Instructions  Your physician wants you to follow-up in: 1 Year You will receive a reminder letter in the mail two months in advance. If you don't receive a letter, please call our office to schedule the follow-up appointment.    Mikael Spray, MD  04/17/2014 6:53 PM    Sanda Klein, MD, [facility] [phone] office [phone] pager

## 2014-04-19 ENCOUNTER — Telehealth: Payer: Self-pay | Admitting: *Deleted

## 2014-04-19 DIAGNOSIS — E785 Hyperlipidemia, unspecified: Secondary | ICD-10-CM

## 2014-04-19 NOTE — Telephone Encounter (Signed)
Lipid profile ordered and mailed to patient.  Patient voiced understanding she needs to go to [place] at her convenience.

## 2014-06-28 ENCOUNTER — Ambulatory Visit (INDEPENDENT_AMBULATORY_CARE_PROVIDER_SITE_OTHER): Payer: BC Managed Care – PPO | Admitting: Family Medicine

## 2014-06-28 ENCOUNTER — Encounter: Payer: Self-pay | Admitting: Family Medicine

## 2014-06-28 VITALS — BP 126/62 | HR 70 | Temp 98.4°F | Resp 14 | Ht 62.0 in | Wt 272.0 lb

## 2014-06-28 DIAGNOSIS — N3000 Acute cystitis without hematuria: Secondary | ICD-10-CM | POA: Diagnosis not present

## 2014-06-28 DIAGNOSIS — F411 Generalized anxiety disorder: Secondary | ICD-10-CM | POA: Diagnosis not present

## 2014-06-28 LAB — URINALYSIS, ROUTINE W REFLEX MICROSCOPIC
Bilirubin Urine: NEGATIVE
Glucose, UA: 100 mg/dL — AB
Ketones, ur: NEGATIVE mg/dL
NITRITE: POSITIVE — AB
PROTEIN: 30 mg/dL — AB
SPECIFIC GRAVITY, URINE: 1.02 (ref 1.005–1.030)
Urobilinogen, UA: 1 mg/dL (ref 0.0–1.0)
pH: 5.5 (ref 5.0–8.0)

## 2014-06-28 LAB — URINALYSIS, MICROSCOPIC ONLY
CASTS: NONE SEEN
CRYSTALS: NONE SEEN

## 2014-06-28 MED ORDER — FLUCONAZOLE 150 MG PO TABS: 150.0000 mg | ORAL_TABLET | Freq: Once | ORAL | Status: DC

## 2014-06-28 MED ORDER — SULFAMETHOXAZOLE-TRIMETHOPRIM 800-160 MG PO TABS: 1.0000 | ORAL_TABLET | Freq: Two times a day (BID) | ORAL | Status: DC

## 2014-06-28 MED ORDER — HYDROXYZINE HCL 25 MG PO TABS: 25.0000 mg | ORAL_TABLET | Freq: Three times a day (TID) | ORAL | Status: DC | PRN

## 2014-06-28 MED ORDER — PAROXETINE HCL 10 MG PO TABS: 10.0000 mg | ORAL_TABLET | Freq: Every day | ORAL | Status: DC

## 2014-06-28 NOTE — Progress Notes (Signed)
Patient ID: Shannon [name], female   DOB: July 28, 1965, 49 y.o.   MRN: [id number]   Subjective:    Patient ID: Shannon [name], female    DOB: 27-Oct-1965, 49 y.o.   MRN: [id number]  Patient presents for Dysuria and Anxiety  Patient here with urinary frequency and pressure pain for the past 4 days. She self treated with AZO over-the-counter. She denies any fever or nausea or vomiting associated.  She is also here to discuss her anxiety. She's had a lot of stress over the past 6 months. She had ovarian cancer and uterine cancer last year status post surgery and treatments for this. There is been a lot of stress since then. She also has a highly stressful job working for the [employer] office in the family recently moved. She finds herself scratching all over when she gets very anxious and upset. She also scratches at nighttime. This is been going on for at least 4 months. She is unable to sleep because her mind is racing. At time she has felt depressed but denies any suicidal ideations. She saw therapist in the past after her father passed away but no other history of mental illness.  No change in soap/lotion, no actual rash seen by pt, no bed bugs, no other family members with rash   Review Of Systems:  GEN- denies fatigue, fever, weight loss,weakness, recent illness HEENT- denies eye drainage, change in vision, nasal discharge, CVS- denies chest pain, palpitations RESP- denies SOB, cough, wheeze ABD- denies N/V, change in stools, abd pain GU- + dysuria, hematuria, dribbling, incontinence MSK- denies joint pain, muscle aches, injury Neuro- denies headache, dizziness, syncope, seizure activity       Objective:    BP 126/62 mmHg  Pulse 70  Temp(Src) 98.4 F (36.9 C) (Oral)  Resp 14  Ht 5\' 2"  (1.575 m)  Wt 272 lb (123.378 kg)  BMI 49.74 kg/m2  LMP 08/06/2013 GEN- NAD, alert and oriented x3 CVS- RRR, no murmur RESP-CTAB ABD-NABS,soft,NT,ND, no CVA tenderness Psych- normal affect and  mood Skin- few excoriations on back, bilat arms, no rash note, no lesions in burrow spaces EXT- No edema Pulses- Radial, DP- 2+        Assessment & Plan:      Problem List Items Addressed This Visit    None    Visit Diagnoses    Acute cystitis without hematuria    -  Primary    Treat with bactrim x 7 days    Relevant Orders    Urinalysis, Routine w reflex microscopic (not at [facility]) (Completed)    Urine culture    GAD (generalized anxiety disorder)        Due to cost, trial of paxil 10mg  at bedtime, will bridge with hydroxyzine at bedtime, advised will make drowsy of taken during the day       Note: This dictation was prepared with Dragon dictation along with smaller phrase technology. Any transcriptional errors that result from this process are unintentional.

## 2014-06-28 NOTE — Patient Instructions (Signed)
Start paxil at bedtime Use atarax as needed for itching/anxiety until paxil gets in your system Take antibiotics as prescribed Take diflucan at end of antibiotics F/U 4 weeks for medications

## 2014-07-01 LAB — URINE CULTURE: Colony Count: >=100000

## 2014-07-11 ENCOUNTER — Encounter: Payer: Self-pay | Admitting: Gynecologic Oncology

## 2014-07-11 ENCOUNTER — Other Ambulatory Visit: Payer: BC Managed Care – PPO

## 2014-07-11 ENCOUNTER — Ambulatory Visit: Payer: BC Managed Care – PPO | Attending: Gynecologic Oncology | Admitting: Gynecologic Oncology

## 2014-07-11 ENCOUNTER — Other Ambulatory Visit (HOSPITAL_BASED_OUTPATIENT_CLINIC_OR_DEPARTMENT_OTHER): Payer: BC Managed Care – PPO | Admitting: Lab

## 2014-07-11 VITALS — BP 121/78 | HR 78 | Temp 97.9°F | Resp 18 | Ht 62.0 in | Wt 274.3 lb

## 2014-07-11 DIAGNOSIS — N3000 Acute cystitis without hematuria: Secondary | ICD-10-CM | POA: Diagnosis not present

## 2014-07-11 DIAGNOSIS — C541 Malignant neoplasm of endometrium: Secondary | ICD-10-CM | POA: Diagnosis not present

## 2014-07-11 DIAGNOSIS — C569 Malignant neoplasm of unspecified ovary: Secondary | ICD-10-CM | POA: Diagnosis not present

## 2014-07-11 LAB — URINALYSIS, MICROSCOPIC - CHCC
BILIRUBIN (URINE): NEGATIVE
BLOOD: NEGATIVE
Glucose: NEGATIVE mg/dL
Ketones: NEGATIVE mg/dL
LEUKOCYTE ESTERASE: NEGATIVE
NITRITE: NEGATIVE
PH: 6.5 (ref 4.6–8.0)
PROTEIN: NEGATIVE mg/dL
RBC / HPF: NEGATIVE (ref 0–2)
Specific Gravity, Urine: 1.015 (ref 1.003–1.035)
Urobilinogen, UR: 0.2 mg/dL (ref 0.2–1)

## 2014-07-11 NOTE — Progress Notes (Signed)
Office Visit:  GYN ONCOLOGY   TK:ZSWFU IA G1 endometrial cancer Stage I low grade ovarian cancer  Surveillance  Assessment:   49 y.o. 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   S/p robotic hysterectomy, BSO and lymphadenectomy on 09/28/13. no LVSI, no myometrial invasion, negative pelvic washings and negative lymph nodes. Ovarian cancer was low grade, with no capsular involvement.   Hx of recent E Coli UTI (on 06/28/14) treated with cipro. New emergence of symptoms after discontinuing abx. Possible recurrent UTI.  Plan:  Follow-up with Dr. Marvel Plan in 3 months F/U with Gyn Onc in 6 months Counseled on the signs and symptoms of recurrent disease  UTI - send for test of cure culture today   HPI:  Shannon [name] is a 49 y.o. year old. initially seen in consultation on 09/02/13 for grade 1 endometrial cancer.  She then underwent a robotic hysterectomy, BSO and lymphadenectomy on 09/30/21 without complications.  Her postoperative course was uncomplicated and she was discharged on postoperative day 1.  Her final pathologic diagnosis is a Stage IA Grade 1 endometrioid endometrial cancer with no lymphovascular space invasion, no myometrial invasion and negative lymph nodes. 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  She was seen on 10/08/13 for a postoperative check and to discuss her pathology results and treatment plan. She was found to be febrile with abdominal pain and was admitted to the hospital for further workup. A CT of the abdomen and pelvis was negative for abscess or pathology (it showed only fluid consistent with lymphatic fluid s/p lymphadenectomy).  Past Medical History  Diagnosis Date  . PONV (postoperative nausea and vomiting)   . Hypertension     not taking med x1 yr.  couldn't afford.  Marland Kitchen UTI (lower urinary tract infection)     history of 1 month ago  . Cancer     Uterine cancer dx. surgery planned TAH  . Uterine cancer   . Ovarian cancer    Past Surgical History  Procedure Laterality Date  . Cholecystectomy      laparoscopic  . Robotic assisted total hysterectomy with bilateral salpingo oopherectomy Bilateral 09/28/2013    Procedure: ROBOTIC ASSISTED TOTAL HYSTERECTOMY WITH BILATERAL SALPINGO OOPHORECTOMY WITH  LYMPH NODE DISECTION, ;  Surgeon: Everitt Amber, MD;  Location: [facility];  Service: Gynecology;  Laterality: Bilateral;   Family History  Problem Relation Age of Onset  . Cancer Maternal Uncle 25    unknown type of cancer  . Cancer Paternal Aunt 91    bone cancer  . Cancer Maternal Uncle 75    colorectal cancer  . Cancer Maternal Uncle 75    throat cancer, smoker  . Cancer Cousin 39    mat first cousin with throat cancer     History   Social History  . Marital Status: Single    Spouse Name: N/A  . Number of Children: N/A  . Years of Education: N/A   Social History Main Topics  . Smoking status: Never Smoker   . Smokeless tobacco: Never Used  . Alcohol Use: No  . Drug Use: No  . Sexual Activity: Not Currently   Other Topics Concern  . None   Social History Narrative    Review of systems: Constitutional:  She has no weight gain or weight loss. No fever or  chills.  Eyes: No blurred vision Ears, Nose, Mouth, Throat: No dizziness, headaches or changes in hearing. No mouth sores. Cardiovascular: No chest pain, palpitations or edema. Respiratory:  No shortness of breath, wheezing or cough Gastrointestinal: She has normal bowel movements without diarrhea or constipation. She denies any nausea or vomiting. She denies blood in her stool or heart burn. Genitourinary:  See HPI, She has no hematuria, but does have dysuria Musculoskeletal: Denies muscle weakness or joint pains.  Skin:  She has no skin changes, rashes or  itching Neurological:  Denies dizziness or headaches. No neuropathy, no numbness or tingling. Psychiatric:  She denies depression or anxiety. Hematologic/Lymphatic:   No easy bruising or bleeding   Physical Exam: Blood pressure 121/78, pulse 78, temperature 97.9 F (36.6 C), temperature source Oral, resp. rate 18, height 5\' 2"  (1.575 m), weight 274 lb 4.8 oz (124.422 kg), last menstrual period 08/06/2013, SpO2 99 %. Body mass index is 50.16 kg/(m^2).  General: Well dressed, well nourished in no apparent distress.   HEENT:  Normocephalic and atraumatic, no lesions.  Extraocular muscles intact. Sclerae anicteric. Pupils equal, round, reactive.  Skin:  No lesions or rashes. Lungs:  Clear to auscultation bilaterally.  No wheezes. Cardiovascular:  Regular rate and rhythm.  No murmurs or rubs. Mildly tachycardic Abdomen:  Soft, generally tender, nondistended but obese.  No palpable masses.  No hepatosplenomegaly.  No ascites. Normal bowel sounds.  No hernias.  Incisions are intact, not draining, healing normally with no signs of cellulitis. Genitourinary: Normal EGBUS  Vaginal cuff intact.  No bleeding or discharge, no lesions.  There are no lesions or separations of the cuff. Rectal:  Good tone, no masses. Extremities: No cyanosis, clubbing or edema.  No calf tenderness or erythema. No palpable cords. Psychiatric: Mood and affect are appropriate. Neurological: Awake, alert and oriented x 3. Sensation is intact, no neuropathy.  Musculoskeletal: No pain, normal strength and range of motion.  Donaciano Eva, MD

## 2014-07-11 NOTE — Patient Instructions (Signed)
We will call you with the urine results from today.   Followup with Dr. Marvel Plan in 3 months and Dr. Denman George in 6 months as scheduled above. Please call our office sooner with any questions or concerns.

## 2014-07-12 LAB — URINE CULTURE

## 2014-07-19 ENCOUNTER — Telehealth: Payer: Self-pay | Admitting: *Deleted

## 2014-07-19 NOTE — Telephone Encounter (Signed)
Per Joylene John, NP patient notified that urine sample did not show any infection. Patient states urinary symptoms have improved. Told patient that if symptoms reemerge to contact her PCP - patient agreeable to this and appreciative of the call.

## 2014-07-26 ENCOUNTER — Encounter: Payer: Self-pay | Admitting: Family Medicine

## 2014-07-26 ENCOUNTER — Ambulatory Visit (INDEPENDENT_AMBULATORY_CARE_PROVIDER_SITE_OTHER): Payer: BC Managed Care – PPO | Admitting: Family Medicine

## 2014-07-26 VITALS — BP 130/68 | HR 76 | Temp 97.6°F | Resp 14 | Ht 62.0 in | Wt 272.0 lb

## 2014-07-26 DIAGNOSIS — D509 Iron deficiency anemia, unspecified: Secondary | ICD-10-CM | POA: Diagnosis not present

## 2014-07-26 DIAGNOSIS — I10 Essential (primary) hypertension: Secondary | ICD-10-CM

## 2014-07-26 DIAGNOSIS — R7301 Impaired fasting glucose: Secondary | ICD-10-CM

## 2014-07-26 DIAGNOSIS — F411 Generalized anxiety disorder: Secondary | ICD-10-CM

## 2014-07-26 LAB — LIPID PANEL
Cholesterol: 134 mg/dL (ref 0–200)
HDL: 51 mg/dL (ref >=46)
LDL Cholesterol: 68 mg/dL (ref 0–99)
Total CHOL/HDL Ratio: 2.6 Ratio
Triglycerides: 76 mg/dL (ref <150)
VLDL: 15 mg/dL (ref 0–40)

## 2014-07-26 LAB — COMPREHENSIVE METABOLIC PANEL
ALBUMIN: 4.2 g/dL (ref 3.5–5.2)
ALK PHOS: 68 U/L (ref 39–117)
ALT: 9 U/L (ref 0–35)
AST: 12 U/L (ref 0–37)
BILIRUBIN TOTAL: 0.5 mg/dL (ref 0.2–1.2)
BUN: 13 mg/dL (ref 6–23)
CO2: 26 mEq/L (ref 19–32)
Calcium: 10.1 mg/dL (ref 8.4–10.5)
Chloride: 107 mEq/L (ref 96–112)
Creat: 0.63 mg/dL (ref 0.50–1.10)
Glucose, Bld: 91 mg/dL (ref 70–99)
POTASSIUM: 4.7 meq/L (ref 3.5–5.3)
Sodium: 145 mEq/L (ref 135–145)
TOTAL PROTEIN: 6.7 g/dL (ref 6.0–8.3)

## 2014-07-26 LAB — CBC WITH DIFFERENTIAL/PLATELET
BASOS ABS: 0.1 10*3/uL (ref 0.0–0.1)
Basophils Relative: 1 % (ref 0–1)
Eosinophils Absolute: 0.2 10*3/uL (ref 0.0–0.7)
Eosinophils Relative: 3 % (ref 0–5)
HCT: 39.9 % (ref 36.0–46.0)
HEMOGLOBIN: 13.6 g/dL (ref 12.0–15.0)
Lymphocytes Relative: 34 % (ref 12–46)
Lymphs Abs: 1.7 10*3/uL (ref 0.7–4.0)
MCH: 29.4 pg (ref 26.0–34.0)
MCHC: 34.1 g/dL (ref 30.0–36.0)
MCV: 86.2 fL (ref 78.0–100.0)
MPV: 9.2 fL (ref 8.6–12.4)
Monocytes Absolute: 0.5 10*3/uL (ref 0.1–1.0)
Monocytes Relative: 10 % (ref 3–12)
Neutro Abs: 2.6 10*3/uL (ref 1.7–7.7)
Neutrophils Relative %: 52 % (ref 43–77)
PLATELETS: 216 10*3/uL (ref 150–400)
RBC: 4.63 MIL/uL (ref 3.87–5.11)
RDW: 13.3 % (ref 11.5–15.5)
WBC: 5 10*3/uL (ref 4.0–10.5)

## 2014-07-26 LAB — HEMOGLOBIN A1C
Hgb A1c MFr Bld: 5.4 % (ref <5.7)
Mean Plasma Glucose: 108 mg/dL (ref <117)

## 2014-07-26 LAB — TSH: TSH: 1.856 u[IU]/mL (ref 0.350–4.500)

## 2014-07-26 NOTE — Assessment & Plan Note (Signed)
History of iron deficiency anemia. We'll recheck her hemoglobin levels. She is not currently on any iron therapy

## 2014-07-26 NOTE — Assessment & Plan Note (Signed)
I will check A1c. She did have an elevated fasting glucose in the past. She also has family history of diabetes mellitus and is morbidly obese therefore at high-risk for development of type 2 diabetes

## 2014-07-26 NOTE — Patient Instructions (Signed)
Continue current medications We will call with lab results  F/U 4 months  

## 2014-07-26 NOTE — Progress Notes (Signed)
Patient ID: Shannon [name], female   DOB: 1965-10-02, 49 y.o.   MRN: [id number]   Subjective:    Patient ID: Shannon [name], female    DOB: Oct 23, 1965, 49 y.o.   MRN: [id number]  Patient presents for 4 week F/U  patient here for interim follow-up. She wants to be checked for diabetes as this runs heavily in her family. She's also had some recurrent infections and her GYN asked if she had been checked for this. I reviewed her chart she's had one elevated fasting blood sugars the last 2 have been normal. She was also started on Paxil or last visit secondary to anxiety a lot of stress this is helping her significantly. She also takes the Atarax but typically on the weekend. She is finding now that the papules in her system that she does not require the Atarax very often. Her previous UTI symptoms have now cleared    Review Of Systems:  GEN- denies fatigue, fever, weight loss,weakness, recent illness HEENT- denies eye drainage, change in vision, nasal discharge, CVS- denies chest pain, palpitations RESP- denies SOB, cough, wheeze ABD- denies N/V, change in stools, abd pain GU- denies dysuria, hematuria, dribbling, incontinence MSK- denies joint pain, muscle aches, injury Neuro- denies headache, dizziness, syncope, seizure activity       Objective:    BP 130/68 mmHg  Pulse 76  Temp(Src) 97.6 F (36.4 C) (Oral)  Resp 14  Ht 5\' 2"  (1.575 m)  Wt 272 lb (123.378 kg)  BMI 49.74 kg/m2  LMP 08/06/2013 GEN- NAD, alert and oriented x3 Neck- Supple, no thyromegaly CVS- RRR, no murmur RESP-CTAB Psych- normal affect and mood, very pleasant, no SI, well groomed EXT- No edema Pulses- Radial 2+        Assessment & Plan:      Problem List Items Addressed This Visit    Morbid obesity   Relevant Orders   Lipid panel   Essential hypertension   Relevant Orders   CBC with Differential/Platelet   Comprehensive metabolic panel   Lipid panel   TSH   Elevated fasting blood sugar   Relevant Orders   Hemoglobin A1c   Anemia, iron deficiency      Note: This dictation was prepared with Dragon dictation along with smaller phrase technology. Any transcriptional errors that result from this process are unintentional.

## 2014-07-26 NOTE — Assessment & Plan Note (Signed)
Continue paxil at current dose

## 2014-07-26 NOTE — Assessment & Plan Note (Signed)
Left pressure is well-controlled with the carvedilol which she uses because of episodes of tachycardia as well. I will be checking her electrolytes as well as her lipid panel will for this on to her cardiologist

## 2014-08-15 ENCOUNTER — Other Ambulatory Visit: Payer: Self-pay | Admitting: Gynecologic Oncology

## 2014-08-30 ENCOUNTER — Other Ambulatory Visit: Payer: Self-pay | Admitting: Obstetrics and Gynecology

## 2014-08-30 DIAGNOSIS — R928 Other abnormal and inconclusive findings on diagnostic imaging of breast: Secondary | ICD-10-CM

## 2014-09-14 ENCOUNTER — Ambulatory Visit
Admission: RE | Admit: 2014-09-14 | Discharge: 2014-09-14 | Disposition: A | Discharge status: Home / Self Care | Payer: BC Managed Care – PPO | Source: Ambulatory Visit | Attending: Obstetrics and Gynecology | Admitting: Obstetrics and Gynecology

## 2014-09-14 DIAGNOSIS — R928 Other abnormal and inconclusive findings on diagnostic imaging of breast: Secondary | ICD-10-CM

## 2014-09-26 ENCOUNTER — Other Ambulatory Visit: Payer: Self-pay | Admitting: Gynecologic Oncology

## 2014-09-26 ENCOUNTER — Other Ambulatory Visit: Payer: Self-pay | Admitting: Family Medicine

## 2014-09-27 NOTE — Telephone Encounter (Signed)
Medication refilled per protocol. 

## 2014-11-13 ENCOUNTER — Other Ambulatory Visit: Payer: Self-pay | Admitting: Family Medicine

## 2014-11-14 NOTE — Telephone Encounter (Signed)
Refill appropriate and filled per protocol. 

## 2014-11-28 ENCOUNTER — Encounter: Payer: Self-pay | Admitting: Family Medicine

## 2014-11-28 ENCOUNTER — Ambulatory Visit (INDEPENDENT_AMBULATORY_CARE_PROVIDER_SITE_OTHER): Payer: BC Managed Care – PPO | Admitting: Family Medicine

## 2014-11-28 VITALS — BP 128/74 | HR 72 | Temp 97.8°F | Resp 16 | Ht 62.0 in | Wt 294.0 lb

## 2014-11-28 DIAGNOSIS — J209 Acute bronchitis, unspecified: Secondary | ICD-10-CM

## 2014-11-28 DIAGNOSIS — F411 Generalized anxiety disorder: Secondary | ICD-10-CM | POA: Diagnosis not present

## 2014-11-28 DIAGNOSIS — I10 Essential (primary) hypertension: Secondary | ICD-10-CM | POA: Diagnosis not present

## 2014-11-28 MED ORDER — AZITHROMYCIN 250 MG PO TABS: ORAL_TABLET | ORAL | Status: DC

## 2014-11-28 MED ORDER — VENLAFAXINE HCL ER 37.5 MG PO CP24: ORAL_CAPSULE | ORAL | Status: DC

## 2014-11-28 MED ORDER — GUAIFENESIN-CODEINE 100-10 MG/5ML PO SOLN
5.0000 mL | Freq: Three times a day (TID) | ORAL | Status: DC | PRN
Start: 2014-11-28 — End: 2015-02-28

## 2014-11-28 NOTE — Patient Instructions (Signed)
Effexor - start with 37.5mg  once a day for 1 week, then increase to 75mg  once a day  Paxil decrease to 1/2 tablet for 1 week, then stop  Zpak,  robitussin AC  I will send your oncologist a note F/U 3 months

## 2014-11-28 NOTE — Progress Notes (Signed)
Patient ID: [name], female   DOB: 1965/06/10, 49 y.o.   MRN: [id number]   Subjective:    Patient ID: Shannon [name], female    DOB: October 17, 1965, 49 y.o.   MRN: [id number]  Patient presents for 4 month F/U and Cough  Pt here to f/u medications, she has gained 18lbs since our last visit   Cough x 3 weeks- non productive, intermittant fever, no chills, taking OTC cough and cold medication, she recently came back from [place] they took her son, to Rehab   Depression and anxiety she's been very stressed as per above her son is battling addiction and they just took into rehabilitation in [place]. She's been stress eating as well. She also had an abnormal mammogram there was concern for cancer but this was found to be cyst which they think may be related to the estrogen that she is on. She is taking the Paxil but is not requiring the hydroxyzine.    Review Of Systems:  GEN- denies fatigue, fever, weight loss,weakness, recent illness HEENT- denies eye drainage, change in vision, nasal discharge, CVS- denies chest pain, palpitations RESP- denies SOB, cough, wheeze ABD- denies N/V, change in stools, abd pain GU- denies dysuria, hematuria, dribbling, incontinence MSK- denies joint pain, muscle aches, injury Neuro- denies headache, dizziness, syncope, seizure activity       Objective:    BP 128/74 mmHg  Pulse 72  Temp(Src) 97.8 F (36.6 C) (Oral)  Resp 16  Ht 5\' 2"  (1.575 m)  Wt 294 lb (133.358 kg)  BMI 53.76 kg/m2  LMP 08/06/2013 GEN- NAD, alert and oriented x3,obese  HEENT- PERRL, EOMI, non injected sclera, pink conjunctiva, MMM, oropharynx clear Neck- Supple, no LAD CVS- RRR, no murmur RESP-course congestion upper airways, no wheeze, good air movement, no retractions  Psych- normal affect and mood  EXT- No edema Pulses- Radial  2+        Assessment & Plan:      Problem List Items Addressed This Visit    Essential hypertension - Primary    Other Visit  Diagnoses    Acute bronchitis, unspecified organism        With recent travel 3 weeks of symptoms, zpak, cough medicine with robitussin       Note: This dictation was prepared with Dragon dictation along with smaller phrase technology. Any transcriptional errors that result from this process are unintentional.

## 2014-11-29 ENCOUNTER — Encounter: Payer: Self-pay | Admitting: Family Medicine

## 2014-11-29 NOTE — Assessment & Plan Note (Signed)
Worsened anxiety, stress eating with son, also concern paxil may be contributing to some weight gain as well. Will taper off and start effexor, plan to taper up to 75mg  per instructions

## 2014-11-29 NOTE — Assessment & Plan Note (Signed)
Controlled, no change to meds 

## 2015-01-09 ENCOUNTER — Telehealth: Payer: Self-pay | Admitting: *Deleted

## 2015-01-09 ENCOUNTER — Encounter: Payer: Self-pay | Admitting: Family Medicine

## 2015-01-09 ENCOUNTER — Ambulatory Visit (INDEPENDENT_AMBULATORY_CARE_PROVIDER_SITE_OTHER): Payer: BC Managed Care – PPO | Admitting: Physician Assistant

## 2015-01-09 ENCOUNTER — Encounter: Payer: Self-pay | Admitting: Physician Assistant

## 2015-01-09 VITALS — BP 132/94 | HR 76 | Temp 98.4°F | Resp 20 | Wt 294.0 lb

## 2015-01-09 DIAGNOSIS — B9689 Other specified bacterial agents as the cause of diseases classified elsewhere: Principal | ICD-10-CM

## 2015-01-09 DIAGNOSIS — J988 Other specified respiratory disorders: Secondary | ICD-10-CM

## 2015-01-09 MED ORDER — SULFACETAMIDE SODIUM 10 % OP SOLN: 2.0000 [drp] | OPHTHALMIC | Status: DC

## 2015-01-09 MED ORDER — AZITHROMYCIN 250 MG PO TABS: ORAL_TABLET | ORAL | Status: DC

## 2015-01-09 NOTE — Telephone Encounter (Signed)
Received call from [facility].   Was advised that Bleph drops in 64mL bottles are on backorder.   Requested VO to change prescription to Bleph drops in 13mL bottle.   VO given.   Provider to be made aware.

## 2015-01-09 NOTE — Addendum Note (Signed)
Addended by: Olena Mater on: 01/09/2015 09:48 AM   Modules accepted: Orders, Medications

## 2015-01-09 NOTE — Telephone Encounter (Signed)
Saw mbd

## 2015-01-09 NOTE — Progress Notes (Signed)
Patient ID: Shannon [name] MRN: [id number], DOB: 1965/12/21, 49 y.o. Date of Encounter: 01/09/2015, 9:42 AM    Chief Complaint:  Chief Complaint  Patient presents with  . sick x 1 week    bad cough/congestion  now eyes red and matted, scratchy throat     HPI: 49 y.o. year old white female presents with above. Says that she has been sick a little over a week. Lots of cough. Says that just yesterday and this morning her eyes started to get a little red and this morning had some crust on her eyelashes and they were matted. Throat is been scratchy but not significantly sore. No known fevers or chills.     Home Meds:   Outpatient Prescriptions Prior to Visit  Medication Sig Dispense Refill  . carvedilol (COREG) 6.25 MG tablet TAKE TWO TABLETS BY MOUTH TWICE DAILY WITH  A  MEAL 120 tablet 8  . docusate sodium (STOOL SOFTENER) 100 MG capsule Take 100 mg by mouth daily as needed for mild constipation.    Marland Kitchen estradiol (ESTRACE) 2 MG tablet TAKE ONE TABLET BY MOUTH ONCE DAILY 30 tablet 3  . guaiFENesin-codeine 100-10 MG/5ML syrup Take 5 mLs by mouth 3 (three) times daily as needed for cough. 180 mL 0  . PARoxetine (PAXIL) 10 MG tablet TAKE ONE TABLET BY MOUTH AT BEDTIME 30 tablet 0  . venlafaxine XR (EFFEXOR XR) 37.5 MG 24 hr capsule Take 1 tablet daily x 1 week, then 2 tablets daily 60 capsule 2  . hydrOXYzine (ATARAX/VISTARIL) 25 MG tablet TAKE ONE TABLET BY MOUTH THREE TIMES DAILY AS NEEDED (Patient not taking: Reported on 11/28/2014) 30 tablet 2  . azithromycin (ZITHROMAX) 250 MG tablet Take 2 tablets x 1 day, then 1 tab daily for 4 days 6 tablet 0   No facility-administered medications prior to visit.    Allergies:  Allergies  Allergen Reactions  . Contrast Media [Iodinated Diagnostic Agents] Anaphylaxis    Swelling of the throat, difficulty breathing  . Tetanus Toxoids     Swelling at site, lethargic x few days      Review of Systems: See HPI for pertinent ROS. All other  ROS negative.    Physical Exam: Blood pressure 132/94, pulse 76, temperature 98.4 F (36.9 C), temperature source Oral, resp. rate 20, weight 294 lb (133.358 kg), last menstrual period 08/06/2013., Body mass index is 53.76 kg/(m^2). General:  Obese white female. Appears in no acute distress. HEENT: Normocephalic, atraumatic, eyes--conjunctiva with minimal diffuse pink erythema.  nares are without discharge. Bilateral auditory canals clear, TM's are without perforation, pearly grey and translucent with reflective cone of light bilaterally. Oral cavity moist, posterior pharynx without exudate, erythema, peritonsillar abscess.  Neck: Supple. No thyromegaly. No lymphadenopathy. Lungs: Clear bilaterally to auscultation without wheezes, rales, or rhonchi. Breathing is unlabored. Heart: Regular rhythm. No murmurs, rubs, or gallops. Msk:  Strength and tone normal for age. Extremities/Skin: Warm and dry.  Neuro: Alert and oriented X 3. Moves all extremities spontaneously. Gait is normal. CNII-XII grossly in tact. Psych:  Responds to questions appropriately with a normal affect.     ASSESSMENT AND PLAN:  49 y.o. year old female with  1. Bacterial respiratory infection She is to take antibiotic as directed. Also use eyedrops as directed. Note given for out of work today and tomorrow with plans to return Wednesday 01/11/15. Follow-up if symptoms do not resolve within 1 week after completion of antibiotic. - azithromycin (ZITHROMAX) 250 MG tablet; Day  1: Take 2 daily. Days 2-5: Take 1 daily.  Dispense: 6 tablet; Refill: 0 - sulfacetamide (BLEPH-10) 10 % ophthalmic solution; Place 2 drops into both eyes every 3 (three) hours.  Dispense: 10 mL; Refill: 0   Signed, [address] [place], [place], [facility] 01/09/2015 9:42 AM

## 2015-01-09 NOTE — Telephone Encounter (Signed)
Agree. Approved. 

## 2015-01-10 ENCOUNTER — Ambulatory Visit: Payer: BC Managed Care – PPO | Admitting: Family Medicine

## 2015-01-13 ENCOUNTER — Other Ambulatory Visit (HOSPITAL_BASED_OUTPATIENT_CLINIC_OR_DEPARTMENT_OTHER): Payer: BC Managed Care – PPO

## 2015-01-13 ENCOUNTER — Encounter: Payer: Self-pay | Admitting: Gynecologic Oncology

## 2015-01-13 ENCOUNTER — Ambulatory Visit: Payer: BC Managed Care – PPO | Attending: Gynecologic Oncology | Admitting: Gynecologic Oncology

## 2015-01-13 VITALS — BP 117/82 | HR 88 | Temp 98.0°F | Resp 19 | Ht 62.0 in | Wt 293.2 lb

## 2015-01-13 DIAGNOSIS — C541 Malignant neoplasm of endometrium: Secondary | ICD-10-CM | POA: Insufficient documentation

## 2015-01-13 DIAGNOSIS — C569 Malignant neoplasm of unspecified ovary: Secondary | ICD-10-CM

## 2015-01-13 DIAGNOSIS — B373 Candidiasis of vulva and vagina: Secondary | ICD-10-CM | POA: Insufficient documentation

## 2015-01-13 DIAGNOSIS — B3731 Acute candidiasis of vulva and vagina: Secondary | ICD-10-CM

## 2015-01-13 MED ORDER — MICONAZOLE NITRATE 200 & 2 MG-% (9GM) VA KIT: 1.0000 | PACK | Freq: Every evening | VAGINAL | Status: DC

## 2015-01-13 NOTE — Progress Notes (Signed)
Office Visit:  GYN ONCOLOGY   [id number] IA G1 endometrial cancer Stage I low grade ovarian cancer  Surveillance  Assessment:   49 y.o. 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   S/p robotic hysterectomy, BSO and lymphadenectomy on 09/28/13. no LVSI, no myometrial invasion, negative pelvic washings and negative lymph nodes. Ovarian cancer was low grade, with no capsular involvement.   Hx of recent E Coli UTI (on 06/28/14) treated with cipro. New emergence of symptoms after discontinuing abx. Possible recurrent UTI.  Plan:  Follow-up with Dr. Marvel Plan in 3 months F/U with Gyn Onc in 6 months Counseled on the signs and symptoms of recurrent disease  UTI - send for test of cure culture today   HPI:  Shannon [name] is a 49 y.o. year old. initially seen in consultation on 09/02/13 for grade 1 endometrial cancer.  She then underwent a robotic hysterectomy, BSO and lymphadenectomy on [id number] without complications.  Her postoperative course was uncomplicated and she was discharged on postoperative day 1.  Her final pathologic diagnosis is a Stage IA Grade 1 endometrioid endometrial cancer with no lymphovascular space invasion, no myometrial invasion and negative lymph nodes. 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  She was seen on 10/08/13 for a postoperative check and to discuss her pathology results and treatment plan. She was found to be febrile with abdominal pain and was admitted to the hospital for further workup. A CT of the abdomen and pelvis was negative for abscess or pathology (it showed only fluid consistent with lymphatic fluid s/p lymphadenectomy).  Past Medical History  Diagnosis Date  . PONV (postoperative nausea and vomiting)   . Hypertension     not taking med x1 yr.  couldn't afford.  Marland Kitchen UTI (lower urinary tract infection)     history of 1 month ago  . Cancer [facility])     Uterine cancer dx. surgery planned TAH  . Uterine cancer ([place])   . Ovarian cancer [facility])    Past Surgical History  Procedure Laterality Date  . Cholecystectomy      laparoscopic  . Robotic assisted total hysterectomy with bilateral salpingo oopherectomy Bilateral 09/28/2013    Procedure: ROBOTIC ASSISTED TOTAL HYSTERECTOMY WITH BILATERAL SALPINGO OOPHORECTOMY WITH  LYMPH NODE DISECTION, ;  Surgeon: Everitt Amber, MD;  Location: [facility];  Service: Gynecology;  Laterality: Bilateral;   Family History  Problem Relation Age of Onset  . Cancer Maternal Uncle 46    unknown type of cancer  . Cancer Paternal Aunt 72    bone cancer  . Cancer Maternal Uncle 75    colorectal cancer  . Cancer Maternal Uncle 75    throat cancer, smoker  . Cancer Cousin 33    mat first cousin with throat cancer     Social History   Social History  . Marital Status: Single    Spouse Name: N/A  . Number of Children: N/A  . Years of Education: N/A   Social History Main Topics  . Smoking status: Never Smoker   . Smokeless tobacco: Never Used  . Alcohol Use: No  . Drug Use: No  . Sexual Activity: Not Currently   Other Topics Concern  . None   Social History Narrative    Review of systems: Constitutional:  She has no weight gain or weight  loss. No fever or chills.  Eyes: No blurred vision Ears, Nose, Mouth, Throat: No dizziness, headaches or changes in hearing. No mouth sores. Cardiovascular: No chest pain, palpitations or edema. Respiratory:  No shortness of breath, wheezing or cough Gastrointestinal: She has normal bowel movements without diarrhea or constipation. She denies any nausea or vomiting. She denies blood in her stool or heart burn. Genitourinary:  See HPI, She has no hematuria, but does have dysuria Musculoskeletal: Denies muscle weakness or joint pains.  Skin:  She has no skin changes,  rashes or itching Neurological:  Denies dizziness or headaches. No neuropathy, no numbness or tingling. Psychiatric:  She denies depression or anxiety. Hematologic/Lymphatic:   No easy bruising or bleeding   Physical Exam: Blood pressure 117/82, pulse 88, temperature 98 F (36.7 C), temperature source Oral, resp. rate 19, height 5\' 2"  (1.575 m), weight 293 lb 3.2 oz (132.995 kg), last menstrual period 08/06/2013, SpO2 95 %. Body mass index is 53.61 kg/(m^2).  General: Well dressed, well nourished in no apparent distress.   HEENT:  Normocephalic and atraumatic, no lesions.  Extraocular muscles intact. Sclerae anicteric. Pupils equal, round, reactive.  Skin:  No lesions or rashes. Lungs:  Clear to auscultation bilaterally.  No wheezes. Cardiovascular:  Regular rate and rhythm.  No murmurs or rubs. Mildly tachycardic Abdomen:  Soft, generally tender, nondistended but obese.  No palpable masses.  No hepatosplenomegaly.  No ascites. Normal bowel sounds.  No hernias.  Incisions are intact, not draining, healing normally with no signs of cellulitis. Genitourinary: Normal EGBUS  Vaginal cuff intact.  No bleeding or discharge, no lesions.  There are no lesions or separations of the cuff. Rectal:  Good tone, no masses. Extremities: No cyanosis, clubbing or edema.  No calf tenderness or erythema. No palpable cords. Psychiatric: Mood and affect are appropriate. Neurological: Awake, alert and oriented x 3. Sensation is intact, no neuropathy.  Musculoskeletal: No pain, normal strength and range of motion.  Donaciano Eva, MD

## 2015-01-13 NOTE — Patient Instructions (Signed)
We will contact you with the results of your CA 125 from today.  Plan to follow up with Dr. Marvel Plan in three months and Dr. Denman George in six months.

## 2015-01-14 LAB — CA 125: CA 125: 5 U/mL (ref <35)

## 2015-01-18 ENCOUNTER — Telehealth: Payer: Self-pay

## 2015-01-18 NOTE — Telephone Encounter (Signed)
Orders received from [facility] , APNP to contat the patient to update with PAP results collected on Dec 16 , 2016 were "normal" . Patient contacted and updated , states understanding , denies further questions or concerns at this time , will call with any changes.

## 2015-02-17 ENCOUNTER — Other Ambulatory Visit: Payer: Self-pay | Admitting: Gynecologic Oncology

## 2015-02-28 ENCOUNTER — Encounter: Payer: Self-pay | Admitting: Family Medicine

## 2015-02-28 ENCOUNTER — Ambulatory Visit (INDEPENDENT_AMBULATORY_CARE_PROVIDER_SITE_OTHER): Payer: BC Managed Care – PPO | Admitting: Family Medicine

## 2015-02-28 VITALS — BP 128/74 | HR 82 | Temp 98.5°F | Resp 16 | Ht 62.0 in | Wt 298.0 lb

## 2015-02-28 DIAGNOSIS — Z23 Encounter for immunization: Secondary | ICD-10-CM

## 2015-02-28 DIAGNOSIS — I10 Essential (primary) hypertension: Secondary | ICD-10-CM

## 2015-02-28 DIAGNOSIS — F411 Generalized anxiety disorder: Secondary | ICD-10-CM

## 2015-02-28 MED ORDER — VENLAFAXINE HCL ER 37.5 MG PO CP24: 37.5000 mg | ORAL_CAPSULE | Freq: Two times a day (BID) | ORAL | Status: DC

## 2015-02-28 MED ORDER — VENLAFAXINE HCL ER 75 MG PO CP24: 75.0000 mg | ORAL_CAPSULE | Freq: Every day | ORAL | Status: DC

## 2015-02-28 NOTE — Patient Instructions (Signed)
Continue current medication Changed to 75mg  pill F/U July for Physical

## 2015-02-28 NOTE — Assessment & Plan Note (Signed)
Seen to encourage dietary changes. We discussed nutrition and eating 3 meals a day regularly

## 2015-02-28 NOTE — Assessment & Plan Note (Signed)
We'll continue with the Effexor XR 75 mg once a day. She is doing well with the medication

## 2015-02-28 NOTE — Assessment & Plan Note (Signed)
Controlled on carvedilol

## 2015-02-28 NOTE — Progress Notes (Signed)
Patient ID: Shannon [name], female   DOB: [birth year], 50 y.o.   MRN: [id number]   Subjective:    Patient ID: Shannon [name], female    DOB: Jun 04, 1965, 49 y.o.   MRN: [id number]  Patient presents for 3 month F/U patient to follow medications. Her last visit I started her on Effexor for anxiety. She is up to 75 mg daily. She states that she feels levelheaded with this medication and she can work and perform her duties. Things are also better at home. She is not having side effects with the medication. She is sleeping okay.  We also discussed her weight she has gained about 4 pounds since her last visit she's not been exercising and she typically only eats twice a day. She does not eat breakfast. She states that things are very busy around the house and she is also helping to take care of her grandmother    Review Of Systems:  GEN- denies fatigue, fever, weight loss,weakness, recent illness HEENT- denies eye drainage, change in vision, nasal discharge, CVS- denies chest pain, palpitations RESP- denies SOB, cough, wheeze ABD- denies N/V, change in stools, abd pain GU- denies dysuria, hematuria, dribbling, incontinence MSK- denies joint pain, muscle aches, injury Neuro- denies headache, dizziness, syncope, seizure activity       Objective:    BP 128/74 mmHg  Pulse 82  Temp(Src) 98.5 F (36.9 C) (Oral)  Resp 16  Ht 5\' 2"  (1.575 m)  Wt 298 lb (135.172 kg)  BMI 54.49 kg/m2  LMP 08/06/2013 GEN- NAD, alert and oriented x3 HEENT- PERRL, EOMI, non injected sclera, pink conjunctiva, MMM, oropharynx clear CVS- RRR, no murmur RESP-CTAB Psych- normal affect and mood         Assessment & Plan:      Problem List Items Addressed This Visit    Morbid obesity ([place])    Seen to encourage dietary changes. We discussed nutrition and eating 3 meals a day regularly      GAD (generalized anxiety disorder)    We'll continue with the Effexor XR 75 mg once a day. She is doing well with  the medication      Essential hypertension    Controlled on carvedilol       Other Visit Diagnoses    Need for prophylactic vaccination and inoculation against influenza    -  Primary    Relevant Orders    Flu Vaccine QUAD 36+ mos PF IM (Fluarix & Fluzone Quad PF) (Completed)       Note: This dictation was prepared with Dragon dictation along with smaller phrase technology. Any transcriptional errors that result from this process are unintentional.

## 2015-03-27 ENCOUNTER — Other Ambulatory Visit: Payer: Self-pay | Admitting: Family Medicine

## 2015-03-27 ENCOUNTER — Other Ambulatory Visit: Payer: Self-pay | Admitting: Gynecologic Oncology

## 2015-07-10 ENCOUNTER — Ambulatory Visit: Payer: BC Managed Care – PPO | Admitting: Gynecologic Oncology

## 2015-08-27 ENCOUNTER — Other Ambulatory Visit: Payer: Self-pay | Admitting: Family Medicine

## 2015-08-30 ENCOUNTER — Other Ambulatory Visit: Payer: Self-pay | Admitting: Obstetrics and Gynecology

## 2015-08-30 DIAGNOSIS — Z1231 Encounter for screening mammogram for malignant neoplasm of breast: Secondary | ICD-10-CM

## 2015-09-13 ENCOUNTER — Ambulatory Visit
Admission: RE | Admit: 2015-09-13 | Discharge: 2015-09-13 | Disposition: A | Discharge status: Home / Self Care | Payer: BC Managed Care – PPO | Source: Ambulatory Visit | Attending: Obstetrics and Gynecology | Admitting: Obstetrics and Gynecology

## 2015-09-13 DIAGNOSIS — Z1231 Encounter for screening mammogram for malignant neoplasm of breast: Secondary | ICD-10-CM

## 2015-09-15 ENCOUNTER — Ambulatory Visit (INDEPENDENT_AMBULATORY_CARE_PROVIDER_SITE_OTHER): Payer: BC Managed Care – PPO | Admitting: Family Medicine

## 2015-09-15 ENCOUNTER — Other Ambulatory Visit: Payer: Self-pay | Admitting: Family Medicine

## 2015-09-15 ENCOUNTER — Encounter: Payer: Self-pay | Admitting: Family Medicine

## 2015-09-15 VITALS — BP 128/70 | HR 84 | Temp 98.8°F | Resp 16 | Ht 62.0 in | Wt 296.0 lb

## 2015-09-15 DIAGNOSIS — F411 Generalized anxiety disorder: Secondary | ICD-10-CM

## 2015-09-15 DIAGNOSIS — I10 Essential (primary) hypertension: Secondary | ICD-10-CM | POA: Diagnosis not present

## 2015-09-15 LAB — CBC WITH DIFFERENTIAL/PLATELET
BASOS ABS: 56 {cells}/uL (ref 0–200)
Basophils Relative: 1 %
Eosinophils Absolute: 112 cells/uL (ref 15–500)
Eosinophils Relative: 2 %
HEMATOCRIT: 40.9 % (ref 35.0–45.0)
HEMOGLOBIN: 13.7 g/dL (ref 12.0–15.0)
Lymphocytes Relative: 29 %
Lymphs Abs: 1624 cells/uL (ref 850–3900)
MCH: 28.1 pg (ref 27.0–33.0)
MCHC: 33.5 g/dL (ref 32.0–36.0)
MCV: 84 fL (ref 80.0–100.0)
MPV: 9.6 fL (ref 7.5–12.5)
Monocytes Absolute: 504 cells/uL (ref 200–950)
Monocytes Relative: 9 %
NEUTROS ABS: 3304 {cells}/uL (ref 1500–7800)
NEUTROS PCT: 59 %
Platelets: 256 10*3/uL (ref 140–400)
RBC: 4.87 MIL/uL (ref 3.80–5.10)
RDW: 13.2 % (ref 11.0–15.0)
WBC: 5.6 10*3/uL (ref 3.8–10.8)

## 2015-09-15 LAB — COMPREHENSIVE METABOLIC PANEL
ALBUMIN: 4.2 g/dL (ref 3.6–5.1)
ALT: 10 U/L (ref 6–29)
AST: 12 U/L (ref 10–35)
Alkaline Phosphatase: 73 U/L (ref 33–115)
BUN: 10 mg/dL (ref 7–25)
CALCIUM: 10.4 mg/dL — AB (ref 8.6–10.2)
CHLORIDE: 105 mmol/L (ref 98–110)
CO2: 27 mmol/L (ref 20–31)
CREATININE: 0.63 mg/dL (ref 0.50–1.10)
Glucose, Bld: 119 mg/dL — ABNORMAL HIGH (ref 70–99)
Potassium: 4.3 mmol/L (ref 3.5–5.3)
SODIUM: 142 mmol/L (ref 135–146)
TOTAL PROTEIN: 6.7 g/dL (ref 6.1–8.1)
Total Bilirubin: 0.5 mg/dL (ref 0.2–1.2)

## 2015-09-15 LAB — LIPID PANEL
CHOLESTEROL: 158 mg/dL (ref 125–200)
HDL: 55 mg/dL (ref >=46)
LDL Cholesterol: 71 mg/dL (ref <130)
TRIGLYCERIDES: 160 mg/dL — AB (ref <150)
Total CHOL/HDL Ratio: 2.9 Ratio (ref <=5.0)
VLDL: 32 mg/dL — AB (ref <30)

## 2015-09-15 MED ORDER — CARVEDILOL 6.25 MG PO TABS: ORAL_TABLET | ORAL | 6 refills | Status: DC

## 2015-09-15 MED ORDER — VENLAFAXINE HCL ER 75 MG PO CP24: 75.0000 mg | ORAL_CAPSULE | Freq: Every day | ORAL | 6 refills | Status: DC

## 2015-09-15 NOTE — Progress Notes (Signed)
   Subjective:    Patient ID: Shannon [name], female    DOB: Aug 04, 1965, 50 y.o.   MRN: [id number]  Patient presents for Medication Management (review/ refill- is not fasting)  Pt here to f/u medications. No specific concerns. Has not done anything to help with her weight. Taking BP and anxiety medications as prescribed.  Continues to f/u with oncology and GYN      Review Of Systems:  GEN- denies fatigue, fever, weight loss,weakness, recent illness HEENT- denies eye drainage, change in vision, nasal discharge, CVS- denies chest pain, palpitations RESP- denies SOB, cough, wheeze ABD- denies N/V, change in stools, abd pain GU- denies dysuria, hematuria, dribbling, incontinence MSK- denies joint pain, muscle aches, injury Neuro- denies headache, dizziness, syncope, seizure activity       Objective:    BP 128/70 (BP Location: Right Arm, Patient Position: Sitting, Cuff Size: Large)   Pulse 84   Temp 98.8 F (37.1 C) (Oral)   Resp 16   Ht 5\' 2"  (1.575 m)   Wt 296 lb (134.3 kg)   LMP 08/06/2013 Comment: hasn't stopped since  BMI 54.14 kg/m  GEN- NAD, alert and oriented x3,obese  HEENT- PERRL, EOMI, non injected sclera, pink conjunctiva, MMM, oropharynx clear Neck- Supple, no thyromegaly CVS- RRR, no murmur RESP-CTAB ABD-NABS,soft,NT,ND Psych- normal affect and mood  EXT- No edema Pulses- Radial  2+        Assessment & Plan:      Problem List Items Addressed This Visit    Morbid obesity ([place])   Relevant Orders   Lipid panel (Completed)   GAD (generalized anxiety disorder)   Essential hypertension - Primary    Well controlled, no change to meds Continued discussion around her obesity, she admits she is not committed to losing weight at this time. She knows ramifications of her weight on her heart and other possible health problems Overall doing well with her anxiety medication no changes       Relevant Medications   carvedilol (COREG) 6.25 MG tablet   Other Relevant Orders   CBC with Differential/Platelet (Completed)   Comprehensive metabolic panel (Completed)   Lipid panel (Completed)    Other Visit Diagnoses   None.     Note: This dictation was prepared with Dragon dictation along with smaller phrase technology. Any transcriptional errors that result from this process are unintentional.

## 2015-09-15 NOTE — Patient Instructions (Signed)
F/U 6 months

## 2015-09-17 ENCOUNTER — Encounter: Payer: Self-pay | Admitting: Family Medicine

## 2015-09-17 NOTE — Assessment & Plan Note (Signed)
Well controlled, no change to meds Continued discussion around her obesity, she admits she is not committed to losing weight at this time. She knows ramifications of her weight on her heart and other possible health problems Overall doing well with her anxiety medication no changes

## 2015-09-19 LAB — HEMOGLOBIN A1C
Hgb A1c MFr Bld: 5.1 % (ref <5.7)
Mean Plasma Glucose: 100 mg/dL

## 2015-10-06 ENCOUNTER — Other Ambulatory Visit (HOSPITAL_BASED_OUTPATIENT_CLINIC_OR_DEPARTMENT_OTHER): Payer: BC Managed Care – PPO

## 2015-10-06 ENCOUNTER — Ambulatory Visit: Payer: BC Managed Care – PPO | Attending: Gynecologic Oncology | Admitting: Gynecologic Oncology

## 2015-10-06 ENCOUNTER — Encounter: Payer: Self-pay | Admitting: Gynecologic Oncology

## 2015-10-06 VITALS — BP 123/81 | HR 74 | Temp 98.4°F | Resp 18 | Ht 62.0 in | Wt 301.5 lb

## 2015-10-06 DIAGNOSIS — C569 Malignant neoplasm of unspecified ovary: Secondary | ICD-10-CM | POA: Diagnosis not present

## 2015-10-06 DIAGNOSIS — Z6841 Body Mass Index (BMI) 40.0 and over, adult: Secondary | ICD-10-CM | POA: Diagnosis not present

## 2015-10-06 DIAGNOSIS — Z800 Family history of malignant neoplasm of digestive organs: Secondary | ICD-10-CM | POA: Diagnosis not present

## 2015-10-06 DIAGNOSIS — Z809 Family history of malignant neoplasm, unspecified: Secondary | ICD-10-CM | POA: Diagnosis not present

## 2015-10-06 DIAGNOSIS — Z90710 Acquired absence of both cervix and uterus: Secondary | ICD-10-CM | POA: Diagnosis not present

## 2015-10-06 DIAGNOSIS — I10 Essential (primary) hypertension: Secondary | ICD-10-CM | POA: Diagnosis not present

## 2015-10-06 DIAGNOSIS — C541 Malignant neoplasm of endometrium: Secondary | ICD-10-CM | POA: Insufficient documentation

## 2015-10-06 DIAGNOSIS — Z90722 Acquired absence of ovaries, bilateral: Secondary | ICD-10-CM | POA: Diagnosis not present

## 2015-10-06 DIAGNOSIS — Z8543 Personal history of malignant neoplasm of ovary: Secondary | ICD-10-CM

## 2015-10-06 DIAGNOSIS — Z9049 Acquired absence of other specified parts of digestive tract: Secondary | ICD-10-CM | POA: Diagnosis not present

## 2015-10-06 DIAGNOSIS — Z87440 Personal history of urinary (tract) infections: Secondary | ICD-10-CM | POA: Diagnosis not present

## 2015-10-06 DIAGNOSIS — Z8542 Personal history of malignant neoplasm of other parts of uterus: Secondary | ICD-10-CM | POA: Diagnosis not present

## 2015-10-06 DIAGNOSIS — Z8501 Personal history of malignant neoplasm of esophagus: Secondary | ICD-10-CM | POA: Insufficient documentation

## 2015-10-06 NOTE — Patient Instructions (Signed)
We will contact you with the results of your CA 125 from today.  Plan to follow up with Dr. Paula Compton in three months and Dr. Everitt Amber in six months.  Please call our office at [phone] after you see Dr. Marvel Plan to schedule your appt with Dr. Denman George.  To begin the process of looking into bariatric surgery, they have you attend a Bariatric Information Seminar.  Please call [phone] to register.

## 2015-10-06 NOTE — Progress Notes (Signed)
Office Visit:  GYN ONCOLOGY   [id number] IA G1 endometrial cancer Stage I low grade ovarian cancer  Surveillance  Assessment:   50 y.o. 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   S/p robotic hysterectomy, BSO and lymphadenectomy on 09/28/13. no LVSI, no myometrial invasion, negative pelvic washings and negative lymph nodes. Ovarian cancer was low grade, with no capsular involvement.   Progressive morbid obesity.  No evidence for recurrence on exam.   Plan:  CA 125 today Follow-up with Dr. Marvel Plan in 3 months F/U with Gyn Onc in 6 months Counseled on the signs and symptoms of recurrent disease  Will provide with referral information for seminar for bariatric surgery consultation.   HPI:  Shannon [name] is a 50 y.o. year old. initially seen in consultation on 09/02/13 for grade 1 endometrial cancer.  She then underwent a robotic hysterectomy, BSO and lymphadenectomy on 123XX123 without complications.  Her postoperative course was uncomplicated and she was discharged on postoperative day 1.  Her final pathologic diagnosis is a Stage IA Grade 1 endometrioid endometrial cancer with no lymphovascular space invasion, no myometrial invasion and negative lymph nodes. 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  She was seen on 10/08/13 for a postoperative check and to discuss her pathology results and treatment plan. She was found to be febrile with abdominal pain and was admitted to the hospital for further workup. A CT of the abdomen and pelvis was negative for abscess or pathology (it showed only fluid consistent with lymphatic fluid s/p lymphadenectomy).  CA 125 in January 2017 was normal at 5.  Interval Hx: The patient is anxious because she continues to gain weight. She  is interested in hearing more about bariatric surgery.  She denies vaginal bleeding, pelvic pain, abnormal discharge, bloating or change in GI habit.  Past Medical History:  Diagnosis Date  . Cancer [facility])    Uterine cancer dx. surgery planned TAH  . Hypertension    not taking med x1 yr. couldn't afford.  . Ovarian cancer ([place])   . PONV (postoperative nausea and vomiting)   . Uterine cancer ([place])   . UTI (lower urinary tract infection)    history of 1 month ago   Past Surgical History:  Procedure Laterality Date  . CHOLECYSTECTOMY     laparoscopic  . ROBOTIC ASSISTED TOTAL HYSTERECTOMY WITH BILATERAL SALPINGO OOPHERECTOMY Bilateral 09/28/2013   Procedure: ROBOTIC ASSISTED TOTAL HYSTERECTOMY WITH BILATERAL SALPINGO OOPHORECTOMY WITH  LYMPH NODE DISECTION, ;  Surgeon: Everitt Amber, MD;  Location: [facility];  Service: Gynecology;  Laterality: Bilateral;   Family History  Problem Relation Age of Onset  . Cancer Maternal Uncle 47    unknown type of cancer  . Cancer Paternal Aunt 40    bone cancer  . Cancer Maternal Uncle 75    colorectal cancer  . Cancer Maternal Uncle 75    throat cancer, smoker  . Cancer Cousin 50    mat first cousin with throat cancer     Social History   Social History  . Marital status: Single    Spouse name: N/A  . Number of children: N/A  . Years of education: N/A   Social History Main Topics  . Smoking status: Never Smoker  . Smokeless tobacco: Never Used  . Alcohol use No  .  Drug use: No  . Sexual activity: Not Currently   Other Topics Concern  . None   Social History Narrative  . None    Review of systems: Constitutional:  She has no weight gain or weight loss. No fever or chills.  Eyes: No blurred vision Ears, Nose, Mouth, Throat: No dizziness, headaches or changes in hearing. No mouth sores. Cardiovascular: No chest pain, palpitations or edema. Respiratory:  No shortness of breath, wheezing or cough Gastrointestinal: She has normal  bowel movements without diarrhea or constipation. She denies any nausea or vomiting. She denies blood in her stool or heart burn. Genitourinary:  See HPI, She has no hematuria, but does have dysuria Musculoskeletal: Denies muscle weakness or joint pains.  Skin:  She has no skin changes, rashes or itching Neurological:  Denies dizziness or headaches. No neuropathy, no numbness or tingling. Psychiatric:  She denies depression or anxiety. Hematologic/Lymphatic:   No easy bruising or bleeding   Physical Exam: Blood pressure 123/81, pulse 74, temperature 98.4 F (36.9 C), temperature source Oral, resp. rate 18, height 5\' 2"  (1.575 m), weight (!) 301 lb 8 oz (136.8 kg), last menstrual period 08/06/2013, SpO2 97 %. Body mass index is 55.15 kg/m.  General: Well dressed, well nourished in no apparent distress.   HEENT:  Normocephalic and atraumatic, no lesions.  Extraocular muscles intact. Sclerae anicteric. Pupils equal, round, reactive.  Skin:  No lesions or rashes. Lungs:  Clear to auscultation bilaterally.  No wheezes. Cardiovascular:  Regular rate and rhythm.  No murmurs or rubs. Mildly tachycardic Abdomen:  Soft, generally tender, nondistended but obese.  No palpable masses.  No hepatosplenomegaly.  No ascites. Normal bowel sounds.  No hernias.  Incisions are intact, not draining, healing normally with no signs of cellulitis. Genitourinary: Normal EGBUS  Vaginal cuff intact.  No bleeding or discharge, no lesions.  There are no lesions or separations of the cuff. Rectal:  Good tone, no masses. Extremities: No cyanosis, clubbing or edema.  No calf tenderness or erythema. No palpable cords. Psychiatric: Mood and affect are appropriate. Neurological: Awake, alert and oriented x 3. Sensation is intact, no neuropathy.  Musculoskeletal: No pain, normal strength and range of motion.  Donaciano Eva, MD

## 2015-10-07 LAB — CA 125: CANCER ANTIGEN (CA) 125: 6.1 U/mL (ref 0.0–38.1)

## 2015-10-09 ENCOUNTER — Telehealth: Payer: Self-pay

## 2015-10-09 NOTE — Telephone Encounter (Signed)
Orders received from [place] to contact the patient to update with results of CA 125 level 6.1. Patient contacted and updated , patient states understanding , denies further questions at this time.

## 2015-11-13 ENCOUNTER — Other Ambulatory Visit: Payer: Self-pay | Admitting: Gynecologic Oncology

## 2016-01-08 IMAGING — CR DG CHEST 2V
2 series · 2 of 2 positions shown · non-contrast
Comparison: None.

CLINICAL DATA: Preop for uterine cancer

EXAM:
CHEST  2 VIEW

[w chest pa]
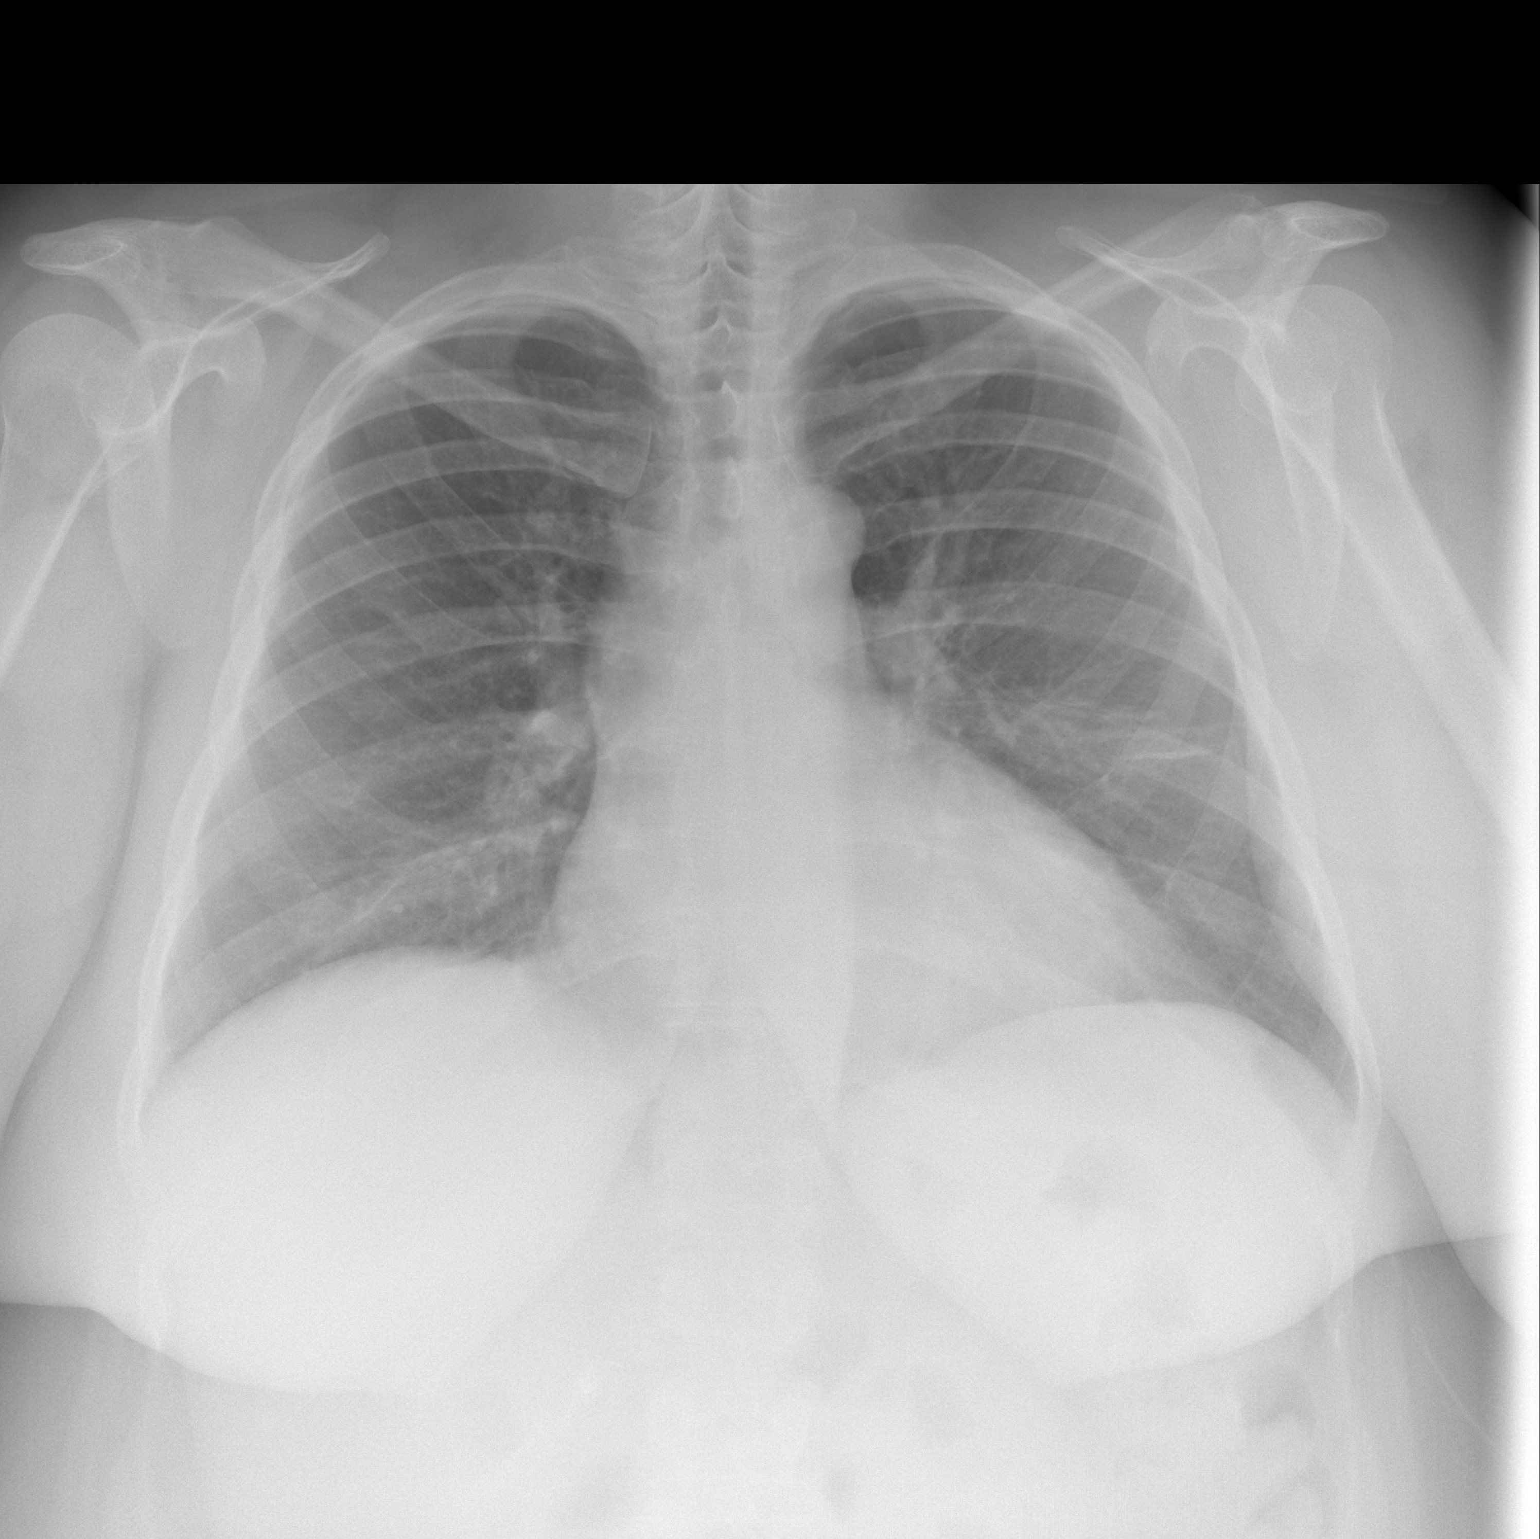

[w chest lat]
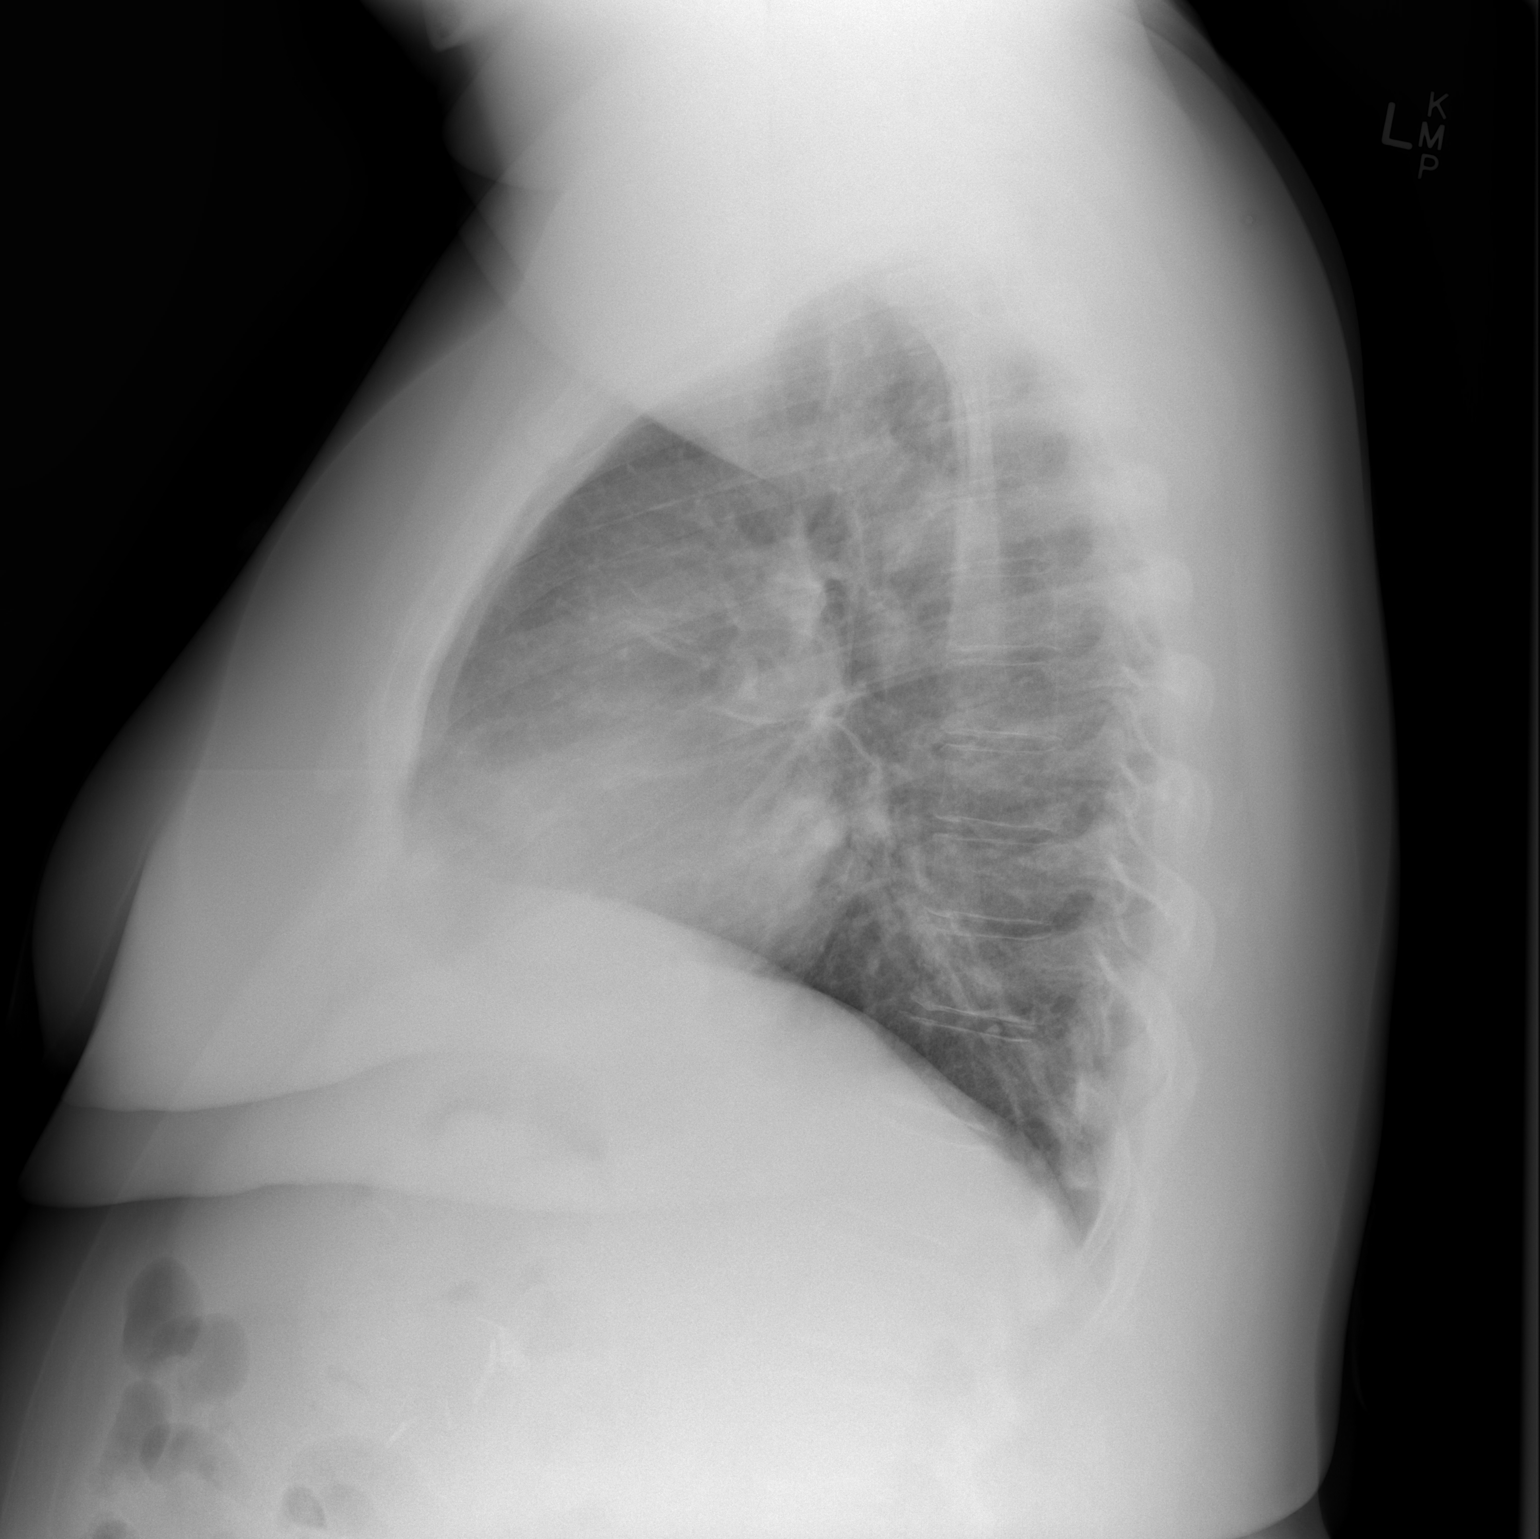

[2 of 2 positions shown; findings below may reference images not displayed]

FINDINGS: Cardiomediastinal silhouette is unremarkable. No acute infiltrate or
pleural effusion. No pulmonary edema. Bony thorax is unremarkable.
IMPRESSION: No active cardiopulmonary disease.

## 2016-05-10 ENCOUNTER — Encounter: Payer: Self-pay | Admitting: Family Medicine

## 2016-05-10 ENCOUNTER — Ambulatory Visit (INDEPENDENT_AMBULATORY_CARE_PROVIDER_SITE_OTHER): Payer: BC Managed Care – PPO | Admitting: Family Medicine

## 2016-05-10 VITALS — BP 130/78 | HR 86 | Temp 98.0°F | Resp 18 | Ht 62.0 in | Wt 310.0 lb

## 2016-05-10 DIAGNOSIS — R42 Dizziness and giddiness: Secondary | ICD-10-CM

## 2016-05-10 NOTE — Progress Notes (Signed)
Subjective:    Patient ID: Shannon [name], female    DOB: 1965-04-21, 51 y.o.   MRN: [id number]  HPI   Last 3 weeks, the patient complains of episodic vertigo. Worse episode was a week ago where she sat up quickly in bed in the room begins 10 and she became nauseated and almost threw up. She denies any tinnitus. She denies any hearing loss. She denies any headache she denies any vision changes. She denies any neurologic deficits. She denies any sinus pain or otalgia. Sometimes vertigo is brought on by motion. Sometimes the vertigo occurs spontaneously. Symptoms last a few seconds and resolve spontaneously. Her examination today is completely normal and her Dix-Hallpike maneuver is negative bilaterally Past Medical History:  Diagnosis Date  . Cancer [facility])    Uterine cancer dx. surgery planned TAH  . Hypertension    not taking med x1 yr. couldn't afford.  . Ovarian cancer ([place])   . PONV (postoperative nausea and vomiting)   . Uterine cancer ([place])   . UTI (lower urinary tract infection)    history of 1 month ago   Past Surgical History:  Procedure Laterality Date  . CHOLECYSTECTOMY     laparoscopic  . ROBOTIC ASSISTED TOTAL HYSTERECTOMY WITH BILATERAL SALPINGO OOPHERECTOMY Bilateral 09/28/2013   Procedure: ROBOTIC ASSISTED TOTAL HYSTERECTOMY WITH BILATERAL SALPINGO OOPHORECTOMY WITH  LYMPH NODE DISECTION, ;  Surgeon: Everitt Amber, MD;  Location: [facility];  Service: Gynecology;  Laterality: Bilateral;   Current Outpatient Prescriptions on File Prior to Visit  Medication Sig Dispense Refill  . carvedilol (COREG) 6.25 MG tablet TAKE TWO TABLETS BY MOUTH TWICE DAILY WITH A MEAL 120 tablet 6  . docusate sodium (STOOL SOFTENER) 100 MG capsule Take 100 mg by mouth daily as needed for mild constipation.    Marland Kitchen estradiol (ESTRACE) 2 MG tablet TAKE ONE TABLET BY MOUTH ONCE DAILY 30 tablet 6  . venlafaxine XR (EFFEXOR-XR) 75 MG 24 hr capsule Take 1 capsule (75 mg total) by mouth daily with breakfast.  30 capsule 6   No current facility-administered medications on file prior to visit.    Allergies  Allergen Reactions  . Contrast Media [Iodinated Diagnostic Agents] Anaphylaxis    Swelling of the throat, difficulty breathing  . Tetanus Toxoids     Swelling at site, lethargic x few days   Social History   Social History  . Marital status: Single    Spouse name: N/A  . Number of children: N/A  . Years of education: N/A   Occupational History  . Not on file.   Social History Main Topics  . Smoking status: Never Smoker  . Smokeless tobacco: Never Used  . Alcohol use No  . Drug use: No  . Sexual activity: Not Currently   Other Topics Concern  . Not on file   Social History Narrative  . No narrative on file      Review of Systems  All other systems reviewed and are negative.      Objective:   Physical Exam  Constitutional: She is oriented to person, place, and time. She appears well-developed and well-nourished.  HENT:  Head: Normocephalic and atraumatic.  Right Ear: Tympanic membrane, external ear and ear canal normal. No decreased hearing is noted.  Left Ear: Tympanic membrane, external ear and ear canal normal. No decreased hearing is noted.  Nose: Nose normal.  Mouth/Throat: Oropharynx is clear and moist. No oropharyngeal exudate.  Cardiovascular: Normal rate, regular rhythm and normal heart sounds.  Exam reveals no gallop and no friction rub.   No murmur heard. Pulmonary/Chest: Effort normal and breath sounds normal. No respiratory distress. She has no wheezes. She has no rales. She exhibits no tenderness.  Neurological: She is alert and oriented to person, place, and time. She has normal reflexes. She displays normal reflexes. No cranial nerve deficit. She exhibits normal muscle tone. Coordination normal.  Vitals reviewed.         Assessment & Plan:  Vertigo - Plan: CBC with Differential/Platelet, COMPLETE METABOLIC PANEL WITH GFR  Patient is  experiencing vertigo. 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 Symptoms are gradually improving after 3 or 4 weeks. I recommended tincture of time. If symptoms persist more than one additional week, I'll have the patient start Epley maneuvers. There is no finding on her physical exam that would warrant neuroimaging at this time. I see no evidence of a space-occupying lesion, neurologic deficits suggesting stroke, etc. I reassured the patient. Monitor clinically for one week and then reassess.

## 2016-05-11 LAB — CBC WITH DIFFERENTIAL/PLATELET
Basophils Absolute: 73 cells/uL (ref 0–200)
Basophils Relative: 1 %
Eosinophils Absolute: 73 cells/uL (ref 15–500)
Eosinophils Relative: 1 %
HEMATOCRIT: 42.6 % (ref 35.0–45.0)
Hemoglobin: 13.9 g/dL (ref 12.0–15.0)
LYMPHS PCT: 27 %
Lymphs Abs: 1971 cells/uL (ref 850–3900)
MCH: 28.1 pg (ref 27.0–33.0)
MCHC: 32.6 g/dL (ref 32.0–36.0)
MCV: 86.2 fL (ref 80.0–100.0)
MONO ABS: 730 {cells}/uL (ref 200–950)
MPV: 9.4 fL (ref 7.5–12.5)
Monocytes Relative: 10 %
Neutro Abs: 4453 cells/uL (ref 1500–7800)
Neutrophils Relative %: 61 %
PLATELETS: 233 10*3/uL (ref 140–400)
RBC: 4.94 MIL/uL (ref 3.80–5.10)
RDW: 13.5 % (ref 11.0–15.0)
WBC: 7.3 10*3/uL (ref 3.8–10.8)

## 2016-05-11 LAB — COMPLETE METABOLIC PANEL WITH GFR
ALT: 12 U/L (ref 6–29)
AST: 15 U/L (ref 10–35)
Albumin: 4.1 g/dL (ref 3.6–5.1)
Alkaline Phosphatase: 74 U/L (ref 33–115)
BUN: 12 mg/dL (ref 7–25)
CHLORIDE: 104 mmol/L (ref 98–110)
CO2: 25 mmol/L (ref 20–31)
CREATININE: 0.75 mg/dL (ref 0.50–1.10)
Calcium: 10.4 mg/dL — ABNORMAL HIGH (ref 8.6–10.2)
GFR, Est African American: >89 mL/min (ref >=60)
GFR, Est Non African American: >89 mL/min (ref >=60)
GLUCOSE: 86 mg/dL (ref 70–99)
Potassium: 4.6 mmol/L (ref 3.5–5.3)
Sodium: 140 mmol/L (ref 135–146)
Total Bilirubin: 0.4 mg/dL (ref 0.2–1.2)
Total Protein: 6.9 g/dL (ref 6.1–8.1)

## 2016-05-15 ENCOUNTER — Encounter: Payer: Self-pay | Admitting: Family Medicine

## 2016-05-15 ENCOUNTER — Other Ambulatory Visit: Payer: Self-pay | Admitting: Family Medicine

## 2016-05-17 ENCOUNTER — Other Ambulatory Visit: Payer: BC Managed Care – PPO

## 2016-05-20 LAB — PTH, INTACT AND CALCIUM
Calcium: 10.2 mg/dL (ref 8.6–10.2)
PTH: 129 pg/mL — ABNORMAL HIGH (ref 14–64)

## 2016-05-21 ENCOUNTER — Encounter: Payer: Self-pay | Admitting: Family Medicine

## 2016-05-21 ENCOUNTER — Other Ambulatory Visit: Payer: Self-pay | Admitting: Family Medicine

## 2016-05-21 DIAGNOSIS — E210 Primary hyperparathyroidism: Secondary | ICD-10-CM

## 2016-06-26 ENCOUNTER — Ambulatory Visit (INDEPENDENT_AMBULATORY_CARE_PROVIDER_SITE_OTHER): Payer: BC Managed Care – PPO | Admitting: Endocrinology

## 2016-06-26 ENCOUNTER — Encounter: Payer: Self-pay | Admitting: Endocrinology

## 2016-06-26 DIAGNOSIS — E213 Hyperparathyroidism, unspecified: Secondary | ICD-10-CM | POA: Diagnosis not present

## 2016-06-26 DIAGNOSIS — E559 Vitamin D deficiency, unspecified: Secondary | ICD-10-CM | POA: Diagnosis not present

## 2016-06-26 LAB — VITAMIN D 25 HYDROXY (VIT D DEFICIENCY, FRACTURES): VITD: 10.8 ng/mL — ABNORMAL LOW (ref 30.00–100.00)

## 2016-06-26 LAB — PHOSPHORUS: PHOSPHORUS: 2.4 mg/dL (ref 2.3–4.6)

## 2016-06-26 MED ORDER — VITAMIN D (ERGOCALCIFEROL) 1.25 MG (50000 UNIT) PO CAPS: 50000.0000 [IU] | ORAL_CAPSULE | ORAL | 0 refills | Status: DC

## 2016-06-26 NOTE — Progress Notes (Signed)
Subjective:    Patient ID: Shannon [name], female    DOB: 10/06/1965, 51 y.o.   MRN: [id number]  HPI Pt is referred by Dr Dennard Schaumann, for hypercalcemia.  Pt was noted to have moderate hypercalcemia in 2017 (it was normal in 2016). she has never had osteoporosis, urolithiasis, thyroid probs, sarcoidosis, PUD, or pancreatitis.  she fx both arms as a child, with significant injuries.  she does not take vitamin-D or A supplements.  Pt denies recently taking antacids, Li++, or HCTZ.  She had TAH and BSO for uterine/ovarian cancer.  She did not need any further rx.  She has slight weight gain, and assoc low-back pain.   Past Medical History:  Diagnosis Date  . Cancer [facility])    Uterine cancer dx. surgery planned TAH  . Hypertension    not taking med x1 yr. couldn't afford.  . Ovarian cancer ([place])   . PONV (postoperative nausea and vomiting)   . Uterine cancer ([place])   . UTI (lower urinary tract infection)    history of 1 month ago    Past Surgical History:  Procedure Laterality Date  . CHOLECYSTECTOMY     laparoscopic  . ROBOTIC ASSISTED TOTAL HYSTERECTOMY WITH BILATERAL SALPINGO OOPHERECTOMY Bilateral 09/28/2013   Procedure: ROBOTIC ASSISTED TOTAL HYSTERECTOMY WITH BILATERAL SALPINGO OOPHORECTOMY WITH  LYMPH NODE DISECTION, ;  Surgeon: Everitt Amber, MD;  Location: [facility];  Service: Gynecology;  Laterality: Bilateral;    Social History   Social History  . Marital status: Single    Spouse name: N/A  . Number of children: N/A  . Years of education: N/A   Occupational History  . Not on file.   Social History Main Topics  . Smoking status: Never Smoker  . Smokeless tobacco: Never Used  . Alcohol use No  . Drug use: No  . Sexual activity: Not Currently   Other Topics Concern  . Not on file   Social History Narrative  . No narrative on file    Current Outpatient Prescriptions on File Prior to Visit  Medication Sig Dispense Refill  . carvedilol (COREG) 6.25 MG tablet TAKE TWO  TABLETS BY MOUTH TWICE DAILY WITH A MEAL 120 tablet 6  . docusate sodium (STOOL SOFTENER) 100 MG capsule Take 100 mg by mouth daily as needed for mild constipation.    Marland Kitchen estradiol (ESTRACE) 2 MG tablet TAKE ONE TABLET BY MOUTH ONCE DAILY 30 tablet 6  . venlafaxine XR (EFFEXOR-XR) 75 MG 24 hr capsule Take 1 capsule (75 mg total) by mouth daily with breakfast. 30 capsule 6   No current facility-administered medications on file prior to visit.     Allergies  Allergen Reactions  . Contrast Media [Iodinated Diagnostic Agents] Anaphylaxis    Swelling of the throat, difficulty breathing  . Tetanus Toxoids     Swelling at site, lethargic x few days    Family History  Problem Relation Age of Onset  . Cancer Maternal Uncle 24       unknown type of cancer  . Cancer Paternal Aunt 82       bone cancer  . Cancer Maternal Uncle 75       colorectal cancer  . Cancer Maternal Uncle 75       throat cancer, smoker  . Cancer Cousin 91       mat first cousin with throat cancer   . Hyperparathyroidism Neg Hx     BP 114/80   Pulse 78   Ht 5'  2" (1.575 m)   Wt (!) 309 lb (140.2 kg)   LMP 08/06/2013 Comment: off/on very heavy at times  SpO2 95%   BMI 56.52 kg/m     Review of Systems Denies weight loss, visual loss, cough, diarrhea, sore throat, polyuria, hematuria, rash, and numbness.  Depression is well-controlled.  She has slight leg edema.       Objective:   Physical Exam VS: see vs page GEN: no distress.  Morbid obesity.  HEAD: head: no deformity eyes: no periorbital swelling, no proptosis external nose and ears are normal mouth: no lesion seen NECK: supple, thyroid is not enlarged CHEST WALL: no deformity.  No kyphosis LUNGS: clear to auscultation CV: reg rate and rhythm, no murmur ABD: abdomen is soft, nontender.  no hepatosplenomegaly.  not distended.  no hernia MUSCULOSKELETAL: muscle bulk and strength are grossly normal.  no obvious joint swelling.  gait is normal and  steady EXTEMITIES: no deformity.  1+ bilat leg edema PULSES: no carotid bruit NEURO:  cn 2-12 grossly intact.   readily moves all 4's.  sensation is intact to touch on all 4's SKIN:  Normal texture and temperature.  No rash or suspicious lesion is visible.   NODES:  None palpable at the neck PSYCH: alert, well-oriented.  Does not appear anxious nor depressed.   Lab Results  Component Value Date   PTH 129 (H) 05/17/2016   CALCIUM 10.2 05/17/2016   Lab Results  Component Value Date   ALT 12 05/10/2016   AST 15 05/10/2016   ALKPHOS 74 05/10/2016   BILITOT 0.4 05/10/2016   Vit-D is low  I personally reviewed electrocardiogram tracing (04/15/14): Indication: tachycardia, uncertain etiology. Impression: NSR.  No MI.  No hypertrophy.  NS-TWA Compared to 2015: no change     Assessment & Plan:  Hyperparathyroidism, new, uncertain if primary or secondary.  If it is primary, her age would be an indication for surgery. Hypercalcemia, uncertain etiology.  Could be due to hyperpathyroidism, but we should w/u to be more certain.  Vit-D deficiency: I have sent a prescription to your pharmacy.  recheck in 1 month.    Patient Instructions  blood tests are requested for you today.  We'll let you know about the results. Based on the results, we will probably find that you have an overactive parathyroid. If you do have this, the only effective treatment is surgery, so I would refer you to surgery specialist.

## 2016-06-26 NOTE — Patient Instructions (Signed)
blood tests are requested for you today.  We'll let you know about the results. Based on the results, we will probably find that you have an overactive parathyroid. If you do have this, the only effective treatment is surgery, so I would refer you to surgery specialist.

## 2016-06-27 LAB — PTH, INTACT AND CALCIUM
CALCIUM: 10.2 mg/dL (ref 8.6–10.2)
PTH: 144 pg/mL — ABNORMAL HIGH (ref 14–64)

## 2016-06-28 ENCOUNTER — Telehealth: Payer: Self-pay | Admitting: Family Medicine

## 2016-06-28 LAB — VITAMIN D 1,25 DIHYDROXY
Vitamin D 1, 25 (OH)2 Total: 50 pg/mL (ref 18–72)
Vitamin D2 1, 25 (OH)2: <8 pg/mL
Vitamin D3 1, 25 (OH)2: 50 pg/mL

## 2016-06-28 NOTE — Telephone Encounter (Signed)
Calling for lab results from 06/26/2016.  Select option two, use spec [id number].  Thank you,  -LL

## 2016-06-30 LAB — VITAMIN A: VITAMIN A (RETINOIC ACID): 24 ug/dL — AB (ref 38–98)

## 2016-07-01 ENCOUNTER — Other Ambulatory Visit: Payer: Self-pay | Admitting: Endocrinology

## 2016-07-01 LAB — PTH-RELATED PEPTIDE

## 2016-07-02 NOTE — Telephone Encounter (Signed)
Can you please call and give these lab results?  Thank you!

## 2016-07-02 NOTE — Telephone Encounter (Signed)
Spoke with patient and informed her of the la results that were obatined on 05/3016. Pt verbalized understanding

## 2016-07-05 ENCOUNTER — Telehealth: Payer: Self-pay | Admitting: Endocrinology

## 2016-07-05 ENCOUNTER — Encounter: Payer: Self-pay | Admitting: Endocrinology

## 2016-07-05 NOTE — Telephone Encounter (Signed)
Patient called and has questions about lab results that are shown on mychart. Please call patient on work number to advise today. Okay to leave a detailed message on work phone.

## 2016-07-08 NOTE — Telephone Encounter (Signed)
Patient made aware of message below.  July 05, 2016  Renato Shin, MD  to [name]       12:39 PM  Hi Ms. Vanantwerp:  Thanks for your message.   Yes, the PTH is higher. The next step is to improve the vitamin-D level. Then we can see what the PTH and calcium are when the vitamin-D is normal. This is because a low vitamin-D level can increase the PTH level.   The other subject is the [place]. I was told that an error was made by the lab. Is it OK with you to have it drawn at the same time as the repeat labs in 1 month?  I hope this helps.

## 2016-07-22 ENCOUNTER — Other Ambulatory Visit: Payer: Self-pay | Admitting: Gynecologic Oncology

## 2016-07-25 ENCOUNTER — Telehealth: Payer: Self-pay | Admitting: Endocrinology

## 2016-07-25 NOTE — Telephone Encounter (Signed)
Patient notified of message and voiced understanding. She had no further questions at this time. Lab appointment scheduled for 07/30/2016.

## 2016-07-25 NOTE — Telephone Encounter (Signed)
Please come in to have f/u labs drawn.  I have ordered.

## 2016-07-25 NOTE — Telephone Encounter (Signed)
See message and please advise, Thanks!  

## 2016-07-25 NOTE — Telephone Encounter (Signed)
Patient finished vitamin D. She would like to know if she needs follow up appointment or lab. Please advise.

## 2016-07-29 ENCOUNTER — Other Ambulatory Visit: Payer: Self-pay | Admitting: Obstetrics and Gynecology

## 2016-07-29 DIAGNOSIS — Z1231 Encounter for screening mammogram for malignant neoplasm of breast: Secondary | ICD-10-CM

## 2016-07-30 ENCOUNTER — Other Ambulatory Visit (INDEPENDENT_AMBULATORY_CARE_PROVIDER_SITE_OTHER): Payer: BC Managed Care – PPO

## 2016-07-30 LAB — VITAMIN D 25 HYDROXY (VIT D DEFICIENCY, FRACTURES): VITD: 22.45 ng/mL — AB (ref 30.00–100.00)

## 2016-08-02 LAB — PTH, INTACT AND CALCIUM
Calcium: 10.4 mg/dL — ABNORMAL HIGH (ref 8.6–10.2)
PTH: 137 pg/mL — ABNORMAL HIGH (ref 14–64)

## 2016-08-05 LAB — PTH-RELATED PEPTIDE: PTH-RELATED PROTEIN (PTH-RP): 12 pg/mL — AB (ref 14–27)

## 2016-08-06 ENCOUNTER — Other Ambulatory Visit: Payer: Self-pay | Admitting: Endocrinology

## 2016-08-06 MED ORDER — VITAMIN D (ERGOCALCIFEROL) 1.25 MG (50000 UNIT) PO CAPS: 50000.0000 [IU] | ORAL_CAPSULE | ORAL | 0 refills | Status: DC

## 2016-08-27 ENCOUNTER — Other Ambulatory Visit: Payer: Self-pay | Admitting: Family Medicine

## 2016-09-02 ENCOUNTER — Other Ambulatory Visit: Payer: Self-pay | Admitting: Endocrinology

## 2016-09-12 ENCOUNTER — Telehealth: Payer: Self-pay | Admitting: Endocrinology

## 2016-09-12 NOTE — Telephone Encounter (Signed)
You could come in for ov, but it would be easier to just come in to have labs done.   They are ordered.

## 2016-09-12 NOTE — Telephone Encounter (Signed)
Left patient a VM that she could call back to make OV but she could also just come in at her convienece for labs.

## 2016-09-12 NOTE — Telephone Encounter (Signed)
Patient was prescribed a second dose of vitamin D from Dr. Loanne Drilling. Does the patient need a follow up appointment? Call patient to advise.

## 2016-09-18 ENCOUNTER — Other Ambulatory Visit (INDEPENDENT_AMBULATORY_CARE_PROVIDER_SITE_OTHER): Payer: BC Managed Care – PPO

## 2016-09-18 ENCOUNTER — Ambulatory Visit
Admission: RE | Admit: 2016-09-18 | Discharge: 2016-09-18 | Disposition: A | Discharge status: Home / Self Care | Payer: BC Managed Care – PPO | Source: Ambulatory Visit | Attending: Obstetrics and Gynecology | Admitting: Obstetrics and Gynecology

## 2016-09-18 DIAGNOSIS — Z1231 Encounter for screening mammogram for malignant neoplasm of breast: Secondary | ICD-10-CM

## 2016-09-18 LAB — VITAMIN D 25 HYDROXY (VIT D DEFICIENCY, FRACTURES): VITD: 26.88 ng/mL — ABNORMAL LOW (ref 30.00–100.00)

## 2016-09-19 ENCOUNTER — Other Ambulatory Visit: Payer: Self-pay | Admitting: Endocrinology

## 2016-09-19 DIAGNOSIS — E210 Primary hyperparathyroidism: Secondary | ICD-10-CM | POA: Insufficient documentation

## 2016-09-19 LAB — PTH, INTACT AND CALCIUM
Calcium: 10.6 mg/dL — ABNORMAL HIGH (ref 8.6–10.2)
PTH: 105 pg/mL — AB (ref 14–64)

## 2016-10-01 ENCOUNTER — Other Ambulatory Visit (HOSPITAL_COMMUNITY): Payer: Self-pay | Admitting: General Surgery

## 2016-10-01 DIAGNOSIS — E210 Primary hyperparathyroidism: Secondary | ICD-10-CM

## 2016-10-07 ENCOUNTER — Encounter: Payer: Self-pay | Admitting: Family Medicine

## 2016-10-07 DIAGNOSIS — E210 Primary hyperparathyroidism: Secondary | ICD-10-CM | POA: Insufficient documentation

## 2016-10-11 ENCOUNTER — Encounter (HOSPITAL_COMMUNITY)
Admission: RE | Admit: 2016-10-11 | Discharge: 2016-10-11 | Disposition: A | Discharge status: Home / Self Care | Payer: BC Managed Care – PPO | Source: Ambulatory Visit | Attending: General Surgery | Admitting: General Surgery

## 2016-10-11 ENCOUNTER — Ambulatory Visit (HOSPITAL_COMMUNITY)
Admission: RE | Admit: 2016-10-11 | Discharge: 2016-10-11 | Disposition: A | Discharge status: Home / Self Care | Payer: BC Managed Care – PPO | Source: Ambulatory Visit | Attending: General Surgery | Admitting: General Surgery

## 2016-10-11 DIAGNOSIS — R938 Abnormal findings on diagnostic imaging of other specified body structures: Secondary | ICD-10-CM | POA: Insufficient documentation

## 2016-10-11 DIAGNOSIS — E210 Primary hyperparathyroidism: Secondary | ICD-10-CM | POA: Insufficient documentation

## 2016-10-11 MED ORDER — TECHNETIUM TC 99M SESTAMIBI GENERIC - CARDIOLITE
23.5000 | Freq: Once | INTRAVENOUS | Status: AC | PRN
  Administered 2016-10-11: 23.5 via INTRAVENOUS

## 2016-10-17 ENCOUNTER — Telehealth: Payer: Self-pay | Admitting: *Deleted

## 2016-10-17 NOTE — Telephone Encounter (Signed)
Returned the patient's call and scheduled an appt for October 22nd at 3:15pm

## 2016-11-18 ENCOUNTER — Ambulatory Visit (HOSPITAL_BASED_OUTPATIENT_CLINIC_OR_DEPARTMENT_OTHER): Payer: BC Managed Care – PPO

## 2016-11-18 ENCOUNTER — Encounter: Payer: Self-pay | Admitting: Gynecologic Oncology

## 2016-11-18 ENCOUNTER — Ambulatory Visit: Payer: BC Managed Care – PPO | Attending: Gynecologic Oncology | Admitting: Gynecologic Oncology

## 2016-11-18 VITALS — BP 155/60 | HR 72 | Temp 98.2°F | Resp 20 | Ht 62.0 in | Wt 315.0 lb

## 2016-11-18 DIAGNOSIS — Z8542 Personal history of malignant neoplasm of other parts of uterus: Secondary | ICD-10-CM

## 2016-11-18 DIAGNOSIS — Z9049 Acquired absence of other specified parts of digestive tract: Secondary | ICD-10-CM | POA: Insufficient documentation

## 2016-11-18 DIAGNOSIS — E213 Hyperparathyroidism, unspecified: Secondary | ICD-10-CM | POA: Insufficient documentation

## 2016-11-18 DIAGNOSIS — Z801 Family history of malignant neoplasm of trachea, bronchus and lung: Secondary | ICD-10-CM | POA: Insufficient documentation

## 2016-11-18 DIAGNOSIS — Z8543 Personal history of malignant neoplasm of ovary: Secondary | ICD-10-CM

## 2016-11-18 DIAGNOSIS — Z800 Family history of malignant neoplasm of digestive organs: Secondary | ICD-10-CM | POA: Insufficient documentation

## 2016-11-18 DIAGNOSIS — Z9889 Other specified postprocedural states: Secondary | ICD-10-CM | POA: Diagnosis not present

## 2016-11-18 DIAGNOSIS — Z90710 Acquired absence of both cervix and uterus: Secondary | ICD-10-CM | POA: Diagnosis not present

## 2016-11-18 DIAGNOSIS — I10 Essential (primary) hypertension: Secondary | ICD-10-CM | POA: Insufficient documentation

## 2016-11-18 DIAGNOSIS — C562 Malignant neoplasm of left ovary: Secondary | ICD-10-CM

## 2016-11-18 DIAGNOSIS — C541 Malignant neoplasm of endometrium: Secondary | ICD-10-CM

## 2016-11-18 DIAGNOSIS — Z808 Family history of malignant neoplasm of other organs or systems: Secondary | ICD-10-CM | POA: Insufficient documentation

## 2016-11-18 DIAGNOSIS — Z809 Family history of malignant neoplasm, unspecified: Secondary | ICD-10-CM | POA: Insufficient documentation

## 2016-11-18 NOTE — Progress Notes (Signed)
Office Visit:  GYN ONCOLOGY   NT:IRWER IA G1 endometrial cancer Stage I low grade ovarian cancer  Surveillance  Assessment:   51 y.o. 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   S/p robotic hysterectomy, BSO and lymphadenectomy on 09/28/13. no LVSI, no myometrial invasion, negative pelvic washings and negative lymph nodes. Ovarian cancer was low grade, with no capsular involvement.   Progressive morbid obesity. Hyperparathyroidism  No evidence for recurrence on exam.   Plan:  CA 125 today Follow-up with Dr. Marvel Plan in 6 months F/U with Gyn Onc in 12 months Counseled on the signs and symptoms of recurrent disease  HPI:  Shannon [name] is a 51 y.o. year old. initially seen in consultation on 09/02/13 for grade 1 endometrial cancer.  She then underwent a robotic hysterectomy, BSO and lymphadenectomy on 02/01/38 without complications.  Her postoperative course was uncomplicated and she was discharged on postoperative day 1.  Her final pathologic diagnosis is a Stage IA Grade 1 endometrioid endometrial cancer with no lymphovascular space invasion, no myometrial invasion and negative lymph nodes. 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  She was seen on 10/08/13 for a postoperative check and to discuss her pathology results and treatment plan. She was found to be febrile with abdominal pain and was admitted to the hospital for further workup. A CT of the abdomen and pelvis was negative for abscess or pathology (it showed only fluid consistent with lymphatic fluid s/p lymphadenectomy).  CA 125 in January 2017 was normal at 5.  Interval Hx: The patient is anxious because she continues to gain weight. She has been recently diagnosed with hyperparathyroidism and is planned  to undergo a parathyroidectomy with Dr Leonel Ramsay.  She denies vaginal bleeding, pelvic pain, abnormal discharge, bloating or change in GI habit.  Past Medical History:  Diagnosis Date  . Cancer [facility])    Uterine cancer dx. surgery planned TAH  . Hypertension    not taking med x1 yr. couldn't afford.  . Ovarian cancer ([place])   . PONV (postoperative nausea and vomiting)   . Primary hyperparathyroidism ([place])   . Uterine cancer ([place])   . UTI (lower urinary tract infection)    history of 1 month ago   Past Surgical History:  Procedure Laterality Date  . CHOLECYSTECTOMY     laparoscopic  . ROBOTIC ASSISTED TOTAL HYSTERECTOMY WITH BILATERAL SALPINGO OOPHERECTOMY Bilateral 09/28/2013   Procedure: ROBOTIC ASSISTED TOTAL HYSTERECTOMY WITH BILATERAL SALPINGO OOPHORECTOMY WITH  LYMPH NODE DISECTION, ;  Surgeon: Everitt Amber, MD;  Location: [facility];  Service: Gynecology;  Laterality: Bilateral;   Family History  Problem Relation Age of Onset  . Cancer Maternal Uncle 21       unknown type of cancer  . Cancer Paternal Aunt 71       bone cancer  . Cancer Maternal Uncle 75       colorectal cancer  . Cancer Maternal Uncle 75       throat cancer, smoker  . Cancer Cousin 34       mat first cousin with throat cancer   . Hyperparathyroidism Sister     Social History   Social History  . Marital status: Single    Spouse name: N/A  . Number of children: N/A  . Years of education: N/A  Social History Main Topics  . Smoking status: Never Smoker  . Smokeless tobacco: Never Used  . Alcohol use No  . Drug use: No  . Sexual activity: Not Currently   Other Topics Concern  . None   Social History Narrative  . None    Review of systems: Constitutional:  She has no weight gain or weight loss. No fever or chills.  Eyes: No blurred vision Ears, Nose, Mouth, Throat: No dizziness, headaches or changes in hearing. No mouth sores. Cardiovascular: No chest pain, palpitations or edema. Respiratory:   No shortness of breath, wheezing or cough Gastrointestinal: She has normal bowel movements without diarrhea or constipation. She denies any nausea or vomiting. She denies blood in her stool or heart burn. Genitourinary:  See HPI, She has no hematuria, but does have dysuria Musculoskeletal: Denies muscle weakness or joint pains.  Skin:  She has no skin changes, rashes or itching Neurological:  Denies dizziness or headaches. No neuropathy, no numbness or tingling. Psychiatric:  She denies depression or anxiety. Hematologic/Lymphatic:   No easy bruising or bleeding   Physical Exam: Blood pressure (!) 155/60, pulse 72, temperature 98.2 F (36.8 C), temperature source Oral, resp. rate 20, height 5\' 2"  (1.575 m), weight (!) 315 lb (142.9 kg), last menstrual period 08/06/2013, SpO2 97 %. Body mass index is 57.61 kg/m.  General: Well dressed, well nourished in no apparent distress.   HEENT:  Normocephalic and atraumatic, no lesions.  Extraocular muscles intact. Sclerae anicteric. Pupils equal, round, reactive.  Skin:  No lesions or rashes. Lungs:  Clear to auscultation bilaterally.  No wheezes. Cardiovascular:  Regular rate and rhythm.  No murmurs or rubs. Mildly tachycardic Abdomen:  Soft, generally tender, nondistended but obese.  No palpable masses.  No hepatosplenomegaly.  No ascites. Normal bowel sounds.  No hernias.  Incisions are intact, not draining, healing normally with no signs of cellulitis. Genitourinary: Normal EGBUS  Vaginal cuff intact.  No bleeding or discharge, no lesions.  There are no lesions or separations of the cuff. Rectal:  Good tone, no masses. Extremities: No cyanosis, clubbing or edema.  No calf tenderness or erythema. No palpable cords. Psychiatric: Mood and affect are appropriate. Neurological: Awake, alert and oriented x 3. Sensation is intact, no neuropathy.  Musculoskeletal: No pain, normal strength and range of motion.  Donaciano Eva, MD

## 2016-11-18 NOTE — Patient Instructions (Signed)
Please follow-up with Dr Marvel Plan in 2019 and with Dr Denman George 6 months after that time (approximately October, 2019). At this point you should have a gynecologic exam every 6 months.  Please notify Dr Denman George at phone number [phone] if you notice vaginal bleeding, new pelvic or abdominal pains, bloating, feeling full easy, or a change in bladder or bowel function.

## 2016-11-19 ENCOUNTER — Telehealth: Payer: Self-pay

## 2016-11-19 LAB — CA 125: CANCER ANTIGEN (CA) 125: 6.3 U/mL (ref 0.0–38.1)

## 2016-11-19 NOTE — Telephone Encounter (Signed)
Told Ms Putnam that her CA-125 from 11-18-16 was WNL per Melissa Cross,NP.

## 2016-12-24 ENCOUNTER — Other Ambulatory Visit: Payer: Self-pay | Admitting: General Surgery

## 2017-02-02 NOTE — H&P (Signed)
Shannon [name] Location: [facility] Patient #: [id number] DOB: [birth year] Divorced / Language: English / Race: White Female      History of Present Illness      This is a 52 year old female who returns with her sister to have a second conversation about primary hyperparathyroidism. She was initially referred by Dr. Renato Shin for surgical option. Shannon [name] is her PCP who diagnosed hypercalcemia.     Hypercalcemia was noted April. No symptoms. No kidney stones, GI problems, joint pains, neurologic or psychiatric problems. Initial calcium was 10.2 with parathyroid hormone level 141. Follow-up calcium 10.4 and hormone level 137. Phosphorus 2.4. Creatinine normal. 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       She wanted to hold off under decision and repeat labs in 2 months. Blood work was drawn on December 12, 2016. Calcium level 10.6. Parathyroid hormone 153. Glucose 97. I explained the lab work to her. I told her that the diagnosis of primary hyperparathyroidism was established and discussed the long-term consequences of this. She knows there is no emergency that she will need an operation at some point. She is relatively young and advised her to consider this selectively. After hearing the lab work she says that she wants to go ahead before the end of the year.     Past history reveals BMI 57. TAH and BSO by Dr. Denman George for what patient states was combined uterine ovarian cancer, stage I. No chemotherapy. Genetic testing negative. Hypertension. Urgent laparoscopic cholecystectomy Family history reveals sister had breast cancer. Father had treatment for thyroid disorder with radiation therapy. 2 uncles with colon cancer. No history to suggest a median syndrome Social history she is single. 2 sons ages 58 and 14. Mother has dementia lives  with her. 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 I discussed the indications, details, techniques, and numerous risk of the surgery with her. She is aware of the risk of bleeding, infection, 4 gland exploration, failure to find the adenoma with further workup necessary, recurrent laryngeal nerve damage with temporary or permanent hoarseness. Other unforeseen problems. She understands these issues. All of her questions were answered. She agrees with this plan.   Past Surgical History  Gallbladder Surgery - Laparoscopic  Hysterectomy (due to cancer) - Complete   Diagnostic Studies History  Colonoscopy  never Mammogram  within last year Pap Smear  1-5 years ago  Allergies  Tetanus Toxoids  Iodine *CHEMICALS*  contrast dye Allergies Reconciled   Medication History  Carvedilol (6.25MG  Tablet, Oral) Active. Estradiol (2MG  Tablet, Oral) Active. Venlafaxine HCl ER (75MG  Capsule ER 24HR, Oral) Active. Medications Reconciled  Social History  Caffeine use  Carbonated beverages, Tea. No alcohol use  No drug use  Tobacco use  Never smoker.  Family History  Arthritis  Father, Mother, Sister. Breast Cancer  Sister. Colon Polyps  Father. Depression  Father. Diabetes Mellitus  Brother, Father, Sister. Heart disease in female family member before age 41  Hypertension  Brother, Father, Mother, Sister. Melanoma  Father. Migraine Headache  Sister. Thyroid problems  Brother, Father.  Pregnancy / Birth History  Age at menarche  4 years. Contraceptive History  Oral contraceptives. Gravida  2 Irregular periods  Length (months) of breastfeeding  3-6 Maternal age  1-25 Para  2  Other Problems  Anxiety Disorder  Cancer  Depression  High blood pressure  Oophorectomy  Bilateral. Ovarian Cancer   Transfusion history     Review of Systems  General Present- Fatigue, Night Sweats and Weight Gain. Not Present- Appetite Loss, Chills, Fever and Weight Loss. Skin Present- Dryness. Not Present- Change in Wart/Mole, Hives, Jaundice, New Lesions, Non-Healing Wounds, Rash and Ulcer. HEENT Present- Seasonal Allergies and Wears glasses/contact lenses. Not Present- Earache, Hearing Loss, Hoarseness, Nose Bleed, Oral Ulcers, Ringing in the Ears, Sinus Pain, Sore Throat, Visual Disturbances and Yellow Eyes. Respiratory Not Present- Bloody sputum, Chronic Cough, Difficulty Breathing, Snoring and Wheezing. Breast Not Present- Breast Mass, Breast Pain, Nipple Discharge and Skin Changes. Cardiovascular Not Present- Chest Pain, Difficulty Breathing Lying Down, Leg Cramps, Palpitations, Rapid Heart Rate, Shortness of Breath and Swelling of Extremities. Gastrointestinal Not Present- Abdominal Pain, Bloating, Bloody Stool, Change in Bowel Habits, Chronic diarrhea, Constipation, Difficulty Swallowing, Excessive gas, Gets full quickly at meals, Hemorrhoids, Indigestion, Nausea, Rectal Pain and Vomiting. Female Genitourinary Present- Frequency and Urgency. Not Present- Nocturia, Painful Urination and Pelvic Pain. Musculoskeletal Present- Muscle Weakness. Not Present- Back Pain, Joint Pain, Joint Stiffness, Muscle Pain and Swelling of Extremities. Neurological Present- Decreased Memory and Weakness. Not Present- Fainting, Headaches, Numbness, Seizures, Tingling, Tremor and Trouble walking. Psychiatric Present- Anxiety and Depression. Not Present- Bipolar, Change in Sleep Pattern, Fearful and Frequent crying. Endocrine Present- Hair Changes. Not Present- Cold Intolerance, Excessive Hunger, Heat Intolerance, Hot flashes and New Diabetes. Hematology Not Present- Blood Thinners, Easy Bruising, Excessive bleeding, Gland problems, HIV and Persistent Infections.  Vitals  Weight: 312.2 lb Height: 62in Body Surface  Area: 2.31 m Body Mass Index: 57.1 kg/m  Temp.: 98.53F  Pulse: 84 (Regular)  BP: 140/90 (Sitting, Left Arm, Standard)    Physical Exam General Mental Status-Alert. General Appearance-Not in acute distress. Build & Nutrition-Well nourished. Posture-Normal posture. Gait-Normal.  Head and Neck Head-normocephalic, atraumatic with no lesions or palpable masses. Trachea-midline. Thyroid Gland Characteristics - normal size and consistency and no palpable nodules. Note: No mass. Good flexion and extension.   Chest and Lung Exam Chest and lung exam reveals -on auscultation, normal breath sounds, no adventitious sounds and normal vocal resonance.  Cardiovascular Cardiovascular examination reveals -normal heart sounds, regular rate and rhythm with no murmurs and femoral artery auscultation bilaterally reveals normal pulses, no bruits, no thrills.  Abdomen Inspection Inspection of the abdomen reveals - No Hernias. Palpation/Percussion Palpation and Percussion of the abdomen reveal - Soft, Non Tender, No Rigidity (guarding), No hepatosplenomegaly and No Palpable abdominal masses.  Neurologic Neurologic evaluation reveals -alert and oriented x 3 with no impairment of recent or remote memory, normal attention span and ability to concentrate, normal sensation and normal coordination.  Musculoskeletal Normal Exam - Bilateral-Upper Extremity Strength Normal and Lower Extremity Strength Normal.    Assessment & Plan  PRIMARY HYPERPARATHYROIDISM (E21.0)   You have returned today with your family to discuss whether or not to have an operation for your primary hyperparathyroidism. Blood work last week shows a calcium level of 10.6, which is elevated. Parathyroid hormone level is 153 which is more than twice the normal range. 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 Dalbert Batman, M.D., [facility] General and Minimally invasive Surgery Breast and Colorectal Surgery Office:   [phone] Pager:   [phone]

## 2017-02-03 NOTE — Patient Instructions (Signed)
Shannon [name]  02/03/2017   Your procedure is scheduled on: 02-07-17   Report to [facility] Main  Entrance Follow signs to Short Stay on first floor at 5:30 AM    Call this number if you have problems the morning of surgery [phone]     Remember: Do not eat food or drink liquids :After Midnight.     Take these medicines the morning of surgery with A SIP OF WATER: Carvedilol (Coreg) and Venlafaxine XR (Effexor-XR)                                You may not have any metal on your body including hair pins and              piercings  Do not wear jewelry, make-up, lotions, powders or perfumes, deodorant             Do not wear nail polish.  Do not shave  48 hours prior to surgery.              Do not bring valuables to the hospital. [place].  Contacts, dentures or bridgework may not be worn into surgery.  Leave suitcase in the car. After surgery it may be brought to your room.               Please read over the following fact sheets you were given: _____________________________________________________________________   [facility] - Preparing for Surgery Before surgery, you can play an important role.  Because skin is not sterile, your skin needs to be as free of germs as possible.  You can reduce the number of germs on your skin by washing with CHG (chlorahexidine gluconate) soap before surgery.  CHG is an antiseptic cleaner which kills germs and bonds with the skin to continue killing germs even after washing. Please DO NOT use if you have an allergy to CHG or antibacterial soaps.  If your skin becomes reddened/irritated stop using the CHG and inform your nurse when you arrive at Short Stay. Do not shave (including legs and underarms) for at least 48 hours prior to the first CHG shower.  You may shave your face/neck. Please follow these instructions carefully:  1.  Shower with CHG Soap the night before  surgery and the  morning of Surgery.  2.  If you choose to wash your hair, wash your hair first as usual with your  normal  shampoo.  3.  After you shampoo, rinse your hair and body thoroughly to remove the  shampoo.                           4.  Use CHG as you would any other liquid soap.  You can apply chg directly  to the skin and wash                       Gently with a scrungie or clean washcloth.  5.  Apply the CHG Soap to your body ONLY FROM THE NECK DOWN.   Do not use on face/ open  Wound or open sores. Avoid contact with eyes, ears mouth and genitals (private parts).                       Wash face,  Genitals (private parts) with your normal soap.             6.  Wash thoroughly, paying special attention to the area where your surgery  will be performed.  7.  Thoroughly rinse your body with warm water from the neck down.  8.  DO NOT shower/wash with your normal soap after using and rinsing off  the CHG Soap.                9.  Pat yourself dry with a clean towel.            10.  Wear clean pajamas.            11.  Place clean sheets on your bed the night of your first shower and do not  sleep with pets. Day of Surgery : Do not apply any lotions/deodorants the morning of surgery.  Please wear clean clothes to the hospital/surgery center.  FAILURE TO FOLLOW THESE INSTRUCTIONS MAY RESULT IN THE CANCELLATION OF YOUR SURGERY PATIENT SIGNATURE_________________________________  NURSE SIGNATURE__________________________________  ________________________________________________________________________

## 2017-02-05 ENCOUNTER — Encounter (HOSPITAL_COMMUNITY): Payer: Self-pay

## 2017-02-05 ENCOUNTER — Encounter (HOSPITAL_COMMUNITY)
Admission: RE | Admit: 2017-02-05 | Discharge: 2017-02-05 | Disposition: A | Discharge status: Home / Self Care | Payer: BC Managed Care – PPO | Source: Ambulatory Visit | Attending: General Surgery | Admitting: General Surgery

## 2017-02-05 ENCOUNTER — Other Ambulatory Visit: Payer: Self-pay

## 2017-02-05 DIAGNOSIS — Z90722 Acquired absence of ovaries, bilateral: Secondary | ICD-10-CM | POA: Diagnosis not present

## 2017-02-05 DIAGNOSIS — Z803 Family history of malignant neoplasm of breast: Secondary | ICD-10-CM | POA: Diagnosis not present

## 2017-02-05 DIAGNOSIS — Z8261 Family history of arthritis: Secondary | ICD-10-CM | POA: Diagnosis not present

## 2017-02-05 DIAGNOSIS — Z818 Family history of other mental and behavioral disorders: Secondary | ICD-10-CM | POA: Diagnosis not present

## 2017-02-05 DIAGNOSIS — Z8249 Family history of ischemic heart disease and other diseases of the circulatory system: Secondary | ICD-10-CM | POA: Diagnosis not present

## 2017-02-05 DIAGNOSIS — Z8349 Family history of other endocrine, nutritional and metabolic diseases: Secondary | ICD-10-CM | POA: Diagnosis not present

## 2017-02-05 DIAGNOSIS — Z800 Family history of malignant neoplasm of digestive organs: Secondary | ICD-10-CM | POA: Diagnosis not present

## 2017-02-05 DIAGNOSIS — F419 Anxiety disorder, unspecified: Secondary | ICD-10-CM | POA: Diagnosis not present

## 2017-02-05 DIAGNOSIS — I10 Essential (primary) hypertension: Secondary | ICD-10-CM | POA: Diagnosis not present

## 2017-02-05 DIAGNOSIS — F329 Major depressive disorder, single episode, unspecified: Secondary | ICD-10-CM | POA: Diagnosis not present

## 2017-02-05 DIAGNOSIS — Z79890 Hormone replacement therapy (postmenopausal): Secondary | ICD-10-CM | POA: Diagnosis not present

## 2017-02-05 DIAGNOSIS — Z887 Allergy status to serum and vaccine status: Secondary | ICD-10-CM | POA: Diagnosis not present

## 2017-02-05 DIAGNOSIS — Z808 Family history of malignant neoplasm of other organs or systems: Secondary | ICD-10-CM | POA: Diagnosis not present

## 2017-02-05 DIAGNOSIS — Z820 Family history of epilepsy and other diseases of the nervous system: Secondary | ICD-10-CM | POA: Diagnosis not present

## 2017-02-05 DIAGNOSIS — Z6841 Body Mass Index (BMI) 40.0 and over, adult: Secondary | ICD-10-CM | POA: Diagnosis not present

## 2017-02-05 DIAGNOSIS — Z91041 Radiographic dye allergy status: Secondary | ICD-10-CM | POA: Diagnosis not present

## 2017-02-05 DIAGNOSIS — Z79899 Other long term (current) drug therapy: Secondary | ICD-10-CM | POA: Diagnosis not present

## 2017-02-05 DIAGNOSIS — Z833 Family history of diabetes mellitus: Secondary | ICD-10-CM | POA: Diagnosis not present

## 2017-02-05 DIAGNOSIS — Z9049 Acquired absence of other specified parts of digestive tract: Secondary | ICD-10-CM | POA: Diagnosis not present

## 2017-02-05 DIAGNOSIS — Z8542 Personal history of malignant neoplasm of other parts of uterus: Secondary | ICD-10-CM | POA: Diagnosis not present

## 2017-02-05 DIAGNOSIS — Z8543 Personal history of malignant neoplasm of ovary: Secondary | ICD-10-CM | POA: Diagnosis not present

## 2017-02-05 DIAGNOSIS — E210 Primary hyperparathyroidism: Secondary | ICD-10-CM | POA: Diagnosis present

## 2017-02-05 DIAGNOSIS — Z90710 Acquired absence of both cervix and uterus: Secondary | ICD-10-CM | POA: Diagnosis not present

## 2017-02-05 LAB — CBC WITH DIFFERENTIAL/PLATELET
BASOS PCT: 1 %
Basophils Absolute: 0 10*3/uL (ref 0.0–0.1)
Eosinophils Absolute: 0.1 10*3/uL (ref 0.0–0.7)
Eosinophils Relative: 2 %
HEMATOCRIT: 42.5 % (ref 36.0–46.0)
HEMOGLOBIN: 13.7 g/dL (ref 12.0–15.0)
LYMPHS ABS: 1.4 10*3/uL (ref 0.7–4.0)
Lymphocytes Relative: 27 %
MCH: 28.2 pg (ref 26.0–34.0)
MCHC: 32.2 g/dL (ref 30.0–36.0)
MCV: 87.6 fL (ref 78.0–100.0)
MONOS PCT: 6 %
Monocytes Absolute: 0.3 10*3/uL (ref 0.1–1.0)
NEUTROS ABS: 3.4 10*3/uL (ref 1.7–7.7)
NEUTROS PCT: 64 %
Platelets: 226 10*3/uL (ref 150–400)
RBC: 4.85 MIL/uL (ref 3.87–5.11)
RDW: 12.5 % (ref 11.5–15.5)
WBC: 5.3 10*3/uL (ref 4.0–10.5)

## 2017-02-05 LAB — COMPREHENSIVE METABOLIC PANEL
ALBUMIN: 4.1 g/dL (ref 3.5–5.0)
ALK PHOS: 66 U/L (ref 38–126)
ALT: 14 U/L (ref 14–54)
AST: 21 U/L (ref 15–41)
Anion gap: 8 (ref 5–15)
BILIRUBIN TOTAL: 0.4 mg/dL (ref 0.3–1.2)
BUN: 7 mg/dL (ref 6–20)
CALCIUM: 10.3 mg/dL (ref 8.9–10.3)
CO2: 25 mmol/L (ref 22–32)
CREATININE: 0.63 mg/dL (ref 0.44–1.00)
Chloride: 106 mmol/L (ref 101–111)
GFR calc Af Amer: >60 mL/min (ref >60)
GFR calc non Af Amer: >60 mL/min (ref >60)
GLUCOSE: 98 mg/dL (ref 65–99)
Potassium: 4.1 mmol/L (ref 3.5–5.1)
SODIUM: 139 mmol/L (ref 135–145)
TOTAL PROTEIN: 7.3 g/dL (ref 6.5–8.1)

## 2017-02-06 MED ORDER — DEXTROSE 5 % IV SOLN
3.0000 g | INTRAVENOUS | Status: AC
  Administered 2017-02-07: 3 g via INTRAVENOUS
  Filled 2017-02-06: qty 3

## 2017-02-07 ENCOUNTER — Ambulatory Visit (HOSPITAL_COMMUNITY): Payer: BC Managed Care – PPO | Admitting: Anesthesiology

## 2017-02-07 ENCOUNTER — Ambulatory Visit (HOSPITAL_COMMUNITY)
Admission: RE | Admit: 2017-02-07 | Discharge: 2017-02-07 | Disposition: A | Discharge status: Home / Self Care | Payer: BC Managed Care – PPO | Source: Ambulatory Visit | Attending: General Surgery | Admitting: General Surgery

## 2017-02-07 ENCOUNTER — Encounter (HOSPITAL_COMMUNITY)
Admission: RE | Disposition: A | Discharge status: Home / Self Care | Payer: Self-pay | Source: Ambulatory Visit | Attending: General Surgery

## 2017-02-07 ENCOUNTER — Encounter (HOSPITAL_COMMUNITY): Payer: Self-pay | Admitting: *Deleted

## 2017-02-07 DIAGNOSIS — Z90710 Acquired absence of both cervix and uterus: Secondary | ICD-10-CM | POA: Insufficient documentation

## 2017-02-07 DIAGNOSIS — Z808 Family history of malignant neoplasm of other organs or systems: Secondary | ICD-10-CM | POA: Insufficient documentation

## 2017-02-07 DIAGNOSIS — I10 Essential (primary) hypertension: Secondary | ICD-10-CM | POA: Insufficient documentation

## 2017-02-07 DIAGNOSIS — Z79890 Hormone replacement therapy (postmenopausal): Secondary | ICD-10-CM | POA: Insufficient documentation

## 2017-02-07 DIAGNOSIS — Z818 Family history of other mental and behavioral disorders: Secondary | ICD-10-CM | POA: Insufficient documentation

## 2017-02-07 DIAGNOSIS — Z820 Family history of epilepsy and other diseases of the nervous system: Secondary | ICD-10-CM | POA: Insufficient documentation

## 2017-02-07 DIAGNOSIS — Z8543 Personal history of malignant neoplasm of ovary: Secondary | ICD-10-CM | POA: Insufficient documentation

## 2017-02-07 DIAGNOSIS — Z833 Family history of diabetes mellitus: Secondary | ICD-10-CM | POA: Insufficient documentation

## 2017-02-07 DIAGNOSIS — Z887 Allergy status to serum and vaccine status: Secondary | ICD-10-CM | POA: Insufficient documentation

## 2017-02-07 DIAGNOSIS — F419 Anxiety disorder, unspecified: Secondary | ICD-10-CM | POA: Insufficient documentation

## 2017-02-07 DIAGNOSIS — F329 Major depressive disorder, single episode, unspecified: Secondary | ICD-10-CM | POA: Insufficient documentation

## 2017-02-07 DIAGNOSIS — Z8261 Family history of arthritis: Secondary | ICD-10-CM | POA: Insufficient documentation

## 2017-02-07 DIAGNOSIS — Z8542 Personal history of malignant neoplasm of other parts of uterus: Secondary | ICD-10-CM | POA: Insufficient documentation

## 2017-02-07 DIAGNOSIS — Z9049 Acquired absence of other specified parts of digestive tract: Secondary | ICD-10-CM | POA: Insufficient documentation

## 2017-02-07 DIAGNOSIS — Z803 Family history of malignant neoplasm of breast: Secondary | ICD-10-CM | POA: Insufficient documentation

## 2017-02-07 DIAGNOSIS — Z8349 Family history of other endocrine, nutritional and metabolic diseases: Secondary | ICD-10-CM | POA: Insufficient documentation

## 2017-02-07 DIAGNOSIS — Z6841 Body Mass Index (BMI) 40.0 and over, adult: Secondary | ICD-10-CM | POA: Insufficient documentation

## 2017-02-07 DIAGNOSIS — Z79899 Other long term (current) drug therapy: Secondary | ICD-10-CM | POA: Insufficient documentation

## 2017-02-07 DIAGNOSIS — Z90722 Acquired absence of ovaries, bilateral: Secondary | ICD-10-CM | POA: Insufficient documentation

## 2017-02-07 DIAGNOSIS — Z91041 Radiographic dye allergy status: Secondary | ICD-10-CM | POA: Insufficient documentation

## 2017-02-07 DIAGNOSIS — Z8249 Family history of ischemic heart disease and other diseases of the circulatory system: Secondary | ICD-10-CM | POA: Insufficient documentation

## 2017-02-07 DIAGNOSIS — E210 Primary hyperparathyroidism: Secondary | ICD-10-CM | POA: Diagnosis not present

## 2017-02-07 DIAGNOSIS — Z800 Family history of malignant neoplasm of digestive organs: Secondary | ICD-10-CM | POA: Insufficient documentation

## 2017-02-07 HISTORY — PX: PARATHYROIDECTOMY: SHX19

## 2017-02-07 SURGERY — PARATHYROIDECTOMY
Anesthesia: General | Site: Neck | Laterality: Right

## 2017-02-07 MED ORDER — PROPOFOL 10 MG/ML IV BOLUS
INTRAVENOUS | Status: AC
  Filled 2017-02-07: qty 20

## 2017-02-07 MED ORDER — CELECOXIB 200 MG PO CAPS
200.0000 mg | ORAL_CAPSULE | ORAL | Status: AC
  Administered 2017-02-07: 200 mg via ORAL
  Filled 2017-02-07: qty 1

## 2017-02-07 MED ORDER — GABAPENTIN 300 MG PO CAPS
300.0000 mg | ORAL_CAPSULE | ORAL | Status: AC
  Administered 2017-02-07: 300 mg via ORAL
  Filled 2017-02-07: qty 1

## 2017-02-07 MED ORDER — PHENYLEPHRINE 40 MCG/ML (10ML) SYRINGE FOR IV PUSH (FOR BLOOD PRESSURE SUPPORT)
PREFILLED_SYRINGE | INTRAVENOUS | Status: AC
  Filled 2017-02-07: qty 10

## 2017-02-07 MED ORDER — SCOPOLAMINE 1 MG/3DAYS TD PT72
MEDICATED_PATCH | TRANSDERMAL | Status: AC
  Filled 2017-02-07: qty 1

## 2017-02-07 MED ORDER — CHLORHEXIDINE GLUCONATE CLOTH 2 % EX PADS: 6.0000 | MEDICATED_PAD | Freq: Once | CUTANEOUS | Status: DC

## 2017-02-07 MED ORDER — OXYCODONE HCL 5 MG PO TABS: 5.0000 mg | ORAL_TABLET | Freq: Once | ORAL | Status: DC | PRN

## 2017-02-07 MED ORDER — BUPIVACAINE HCL (PF) 0.5 % IJ SOLN
INTRAMUSCULAR | Status: AC
  Filled 2017-02-07: qty 30

## 2017-02-07 MED ORDER — SCOPOLAMINE 1 MG/3DAYS TD PT72
1.0000 | MEDICATED_PATCH | TRANSDERMAL | Status: DC
  Administered 2017-02-07: 1 via TRANSDERMAL

## 2017-02-07 MED ORDER — LIDOCAINE 2% (20 MG/ML) 5 ML SYRINGE
INTRAMUSCULAR | Status: DC | PRN
  Administered 2017-02-07: 1.5 mg/kg/h via INTRAVENOUS

## 2017-02-07 MED ORDER — HYDROCODONE-ACETAMINOPHEN 5-325 MG PO TABS: 1.0000 | ORAL_TABLET | Freq: Four times a day (QID) | ORAL | 0 refills | Status: DC | PRN

## 2017-02-07 MED ORDER — PROPOFOL 10 MG/ML IV BOLUS
INTRAVENOUS | Status: DC | PRN
  Administered 2017-02-07: 200 mg via INTRAVENOUS

## 2017-02-07 MED ORDER — LACTATED RINGERS IV SOLN
INTRAVENOUS | Status: DC
  Administered 2017-02-07: 06:00:00 via INTRAVENOUS

## 2017-02-07 MED ORDER — MIDAZOLAM HCL 5 MG/5ML IJ SOLN
INTRAMUSCULAR | Status: DC | PRN
  Administered 2017-02-07 (×2): 1 mg via INTRAVENOUS

## 2017-02-07 MED ORDER — ROCURONIUM BROMIDE 100 MG/10ML IV SOLN
INTRAVENOUS | Status: DC | PRN
  Administered 2017-02-07: 50 mg via INTRAVENOUS

## 2017-02-07 MED ORDER — BUPIVACAINE HCL (PF) 0.5 % IJ SOLN
INTRAMUSCULAR | Status: DC | PRN
  Administered 2017-02-07: 6 mL

## 2017-02-07 MED ORDER — PHENYLEPHRINE 40 MCG/ML (10ML) SYRINGE FOR IV PUSH (FOR BLOOD PRESSURE SUPPORT)
PREFILLED_SYRINGE | INTRAVENOUS | Status: AC
  Filled 2017-02-07: qty 20

## 2017-02-07 MED ORDER — OXYCODONE HCL 5 MG/5ML PO SOLN
5.0000 mg | Freq: Once | ORAL | Status: DC | PRN
  Filled 2017-02-07: qty 5

## 2017-02-07 MED ORDER — HYDROMORPHONE HCL 1 MG/ML IJ SOLN: 0.2500 mg | INTRAMUSCULAR | Status: DC | PRN

## 2017-02-07 MED ORDER — ONDANSETRON HCL 4 MG/2ML IJ SOLN
INTRAMUSCULAR | Status: AC
  Filled 2017-02-07: qty 2

## 2017-02-07 MED ORDER — PROMETHAZINE HCL 25 MG/ML IJ SOLN: 6.2500 mg | INTRAMUSCULAR | Status: DC | PRN

## 2017-02-07 MED ORDER — 0.9 % SODIUM CHLORIDE (POUR BTL) OPTIME
TOPICAL | Status: DC | PRN
  Administered 2017-02-07: 1000 mL

## 2017-02-07 MED ORDER — DEXAMETHASONE SODIUM PHOSPHATE 10 MG/ML IJ SOLN
INTRAMUSCULAR | Status: DC | PRN
  Administered 2017-02-07: 10 mg via INTRAVENOUS

## 2017-02-07 MED ORDER — EPHEDRINE 5 MG/ML INJ
INTRAVENOUS | Status: AC
  Filled 2017-02-07: qty 10

## 2017-02-07 MED ORDER — LIDOCAINE HCL 2 % IJ SOLN
INTRAMUSCULAR | Status: AC
  Filled 2017-02-07: qty 20

## 2017-02-07 MED ORDER — FENTANYL CITRATE (PF) 100 MCG/2ML IJ SOLN
INTRAMUSCULAR | Status: DC | PRN
  Administered 2017-02-07: 100 ug via INTRAVENOUS

## 2017-02-07 MED ORDER — ACETAMINOPHEN 500 MG PO TABS
1000.0000 mg | ORAL_TABLET | ORAL | Status: AC
  Administered 2017-02-07: 1000 mg via ORAL
  Filled 2017-02-07: qty 2

## 2017-02-07 MED ORDER — SUGAMMADEX SODIUM 500 MG/5ML IV SOLN
INTRAVENOUS | Status: DC | PRN
  Administered 2017-02-07: 300 mg via INTRAVENOUS

## 2017-02-07 MED ORDER — FAMOTIDINE 20 MG PO TABS
20.0000 mg | ORAL_TABLET | Freq: Once | ORAL | Status: AC
  Administered 2017-02-07: 10 mg via ORAL
  Filled 2017-02-07: qty 1

## 2017-02-07 MED ORDER — LIDOCAINE HCL (CARDIAC) 20 MG/ML IV SOLN
INTRAVENOUS | Status: DC | PRN
  Administered 2017-02-07: 50 mg via INTRAVENOUS

## 2017-02-07 MED ORDER — PHENYLEPHRINE HCL 10 MG/ML IJ SOLN
INTRAMUSCULAR | Status: DC | PRN
  Administered 2017-02-07 (×4): 80 ug via INTRAVENOUS
  Administered 2017-02-07: 120 ug via INTRAVENOUS
  Administered 2017-02-07 (×2): 80 ug via INTRAVENOUS

## 2017-02-07 MED ORDER — KETAMINE HCL 10 MG/ML IJ SOLN
INTRAMUSCULAR | Status: DC | PRN
  Administered 2017-02-07: 30 mg via INTRAVENOUS

## 2017-02-07 MED ORDER — FENTANYL CITRATE (PF) 250 MCG/5ML IJ SOLN
INTRAMUSCULAR | Status: AC
  Filled 2017-02-07: qty 5

## 2017-02-07 MED ORDER — KETAMINE HCL 10 MG/ML IJ SOLN
INTRAMUSCULAR | Status: AC
  Filled 2017-02-07: qty 1

## 2017-02-07 MED ORDER — SUGAMMADEX SODIUM 500 MG/5ML IV SOLN
INTRAVENOUS | Status: AC
  Filled 2017-02-07: qty 5

## 2017-02-07 MED ORDER — MIDAZOLAM HCL 2 MG/2ML IJ SOLN
INTRAMUSCULAR | Status: AC
  Filled 2017-02-07: qty 2

## 2017-02-07 MED ORDER — EPHEDRINE SULFATE 50 MG/ML IJ SOLN
INTRAMUSCULAR | Status: DC | PRN
  Administered 2017-02-07 (×3): 10 mg via INTRAVENOUS
  Administered 2017-02-07: 5 mg via INTRAVENOUS

## 2017-02-07 SURGICAL SUPPLY — 37 items
ATTRACTOMAT 16X20 MAGNETIC DRP (DRAPES) ×3 IMPLANT
BENZOIN TINCTURE PRP APPL 2/3 (GAUZE/BANDAGES/DRESSINGS) ×3 IMPLANT
BLADE HEX COATED 2.75 (ELECTRODE) ×3 IMPLANT
BLADE SURG 15 STRL LF DISP TIS (BLADE) ×1 IMPLANT
BLADE SURG 15 STRL SS (BLADE) ×2
CHLORAPREP W/TINT 26ML (MISCELLANEOUS) ×3 IMPLANT
CLIP VESOCCLUDE MED 6/CT (CLIP) ×6 IMPLANT
CLIP VESOCCLUDE SM WIDE 6/CT (CLIP) ×6 IMPLANT
CLOSURE WOUND 1/2 X4 (GAUZE/BANDAGES/DRESSINGS) ×1
DISSECTOR ROUND CHERRY 3/8 STR (MISCELLANEOUS) ×3 IMPLANT
DRAPE LAPAROTOMY T 98X78 PEDS (DRAPES) ×3 IMPLANT
ELECT PENCIL ROCKER SW 15FT (MISCELLANEOUS) ×3 IMPLANT
ELECT REM PT RETURN 15FT ADLT (MISCELLANEOUS) ×3 IMPLANT
GAUZE SPONGE 4X4 12PLY STRL (GAUZE/BANDAGES/DRESSINGS) ×3 IMPLANT
GAUZE SPONGE 4X4 16PLY XRAY LF (GAUZE/BANDAGES/DRESSINGS) ×3 IMPLANT
GLOVE EUDERMIC 7 POWDERFREE (GLOVE) ×3 IMPLANT
GOWN STRL REUS W/TWL LRG LVL3 (GOWN DISPOSABLE) ×3 IMPLANT
GOWN STRL REUS W/TWL XL LVL3 (GOWN DISPOSABLE) ×9 IMPLANT
HEMOSTAT SURGICEL 2X4 FIBR (HEMOSTASIS) ×3 IMPLANT
KIT BASIN OR (CUSTOM PROCEDURE TRAY) ×3 IMPLANT
NS IRRIG 1000ML POUR BTL (IV SOLUTION) ×3 IMPLANT
PACK BASIC VI WITH GOWN DISP (CUSTOM PROCEDURE TRAY) ×3 IMPLANT
SHEARS HARMONIC 9CM CVD (BLADE) ×3 IMPLANT
STAPLER VISISTAT 35W (STAPLE) IMPLANT
STRIP CLOSURE SKIN 1/2X4 (GAUZE/BANDAGES/DRESSINGS) ×2 IMPLANT
SUT MNCRL AB 4-0 PS2 18 (SUTURE) ×3 IMPLANT
SUT SILK 2 0 (SUTURE) ×2
SUT SILK 2-0 18XBRD TIE 12 (SUTURE) ×1 IMPLANT
SUT SILK 3 0 (SUTURE)
SUT SILK 3-0 18XBRD TIE 12 (SUTURE) IMPLANT
SUT VIC AB 3-0 54XBRD REEL (SUTURE) ×1 IMPLANT
SUT VIC AB 3-0 BRD 54 (SUTURE) ×2
SUT VIC AB 3-0 SH 18 (SUTURE) ×6 IMPLANT
SYR BULB IRRIGATION 50ML (SYRINGE) ×3 IMPLANT
TAPE CLOTH SURG 4X10 WHT LF (GAUZE/BANDAGES/DRESSINGS) ×3 IMPLANT
TOWEL OR 17X26 10 PK STRL BLUE (TOWEL DISPOSABLE) ×3 IMPLANT
YANKAUER SUCT BULB TIP 10FT TU (MISCELLANEOUS) ×3 IMPLANT

## 2017-02-07 NOTE — Op Note (Signed)
Patient Name:           Shannon [name]   Date of Surgery:        02/07/2017  Pre op Diagnosis:      Primary hyperparathyroidism  Post op Diagnosis:    Primary hyperparathyroidism  Procedure:                 Minimally invasive right parathyroidectomy,   Frozen section Is Surgeon:                     Edsel Petrin. Dalbert Batman, M.D., FACS  Assistant:                      Armandina Gemma obstruction MD.   Indication for Assistant: Complex exposure, complex dissection  Operative Indications:   This is a 52 year old female who is brought to the operating room for parathyroidectomy. he.  W.  Very minimal material is as initially referred by Dr. Renato Shin for surgical option. Jenna Luo is her PCP who diagnosed hypercalcemia.     Hypercalcemia was noted April. No symptoms. No kidney stones, GI problems, joint pains, neurologic or psychiatric problems. Initial calcium was 10.2 with parathyroid hormone level 141. Follow-up calcium 10.4 and hormone level 137. Phosphorus 2.4. Creatinine normal. 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       She wanted to hold off under decision and repeat labs in 2 months. Blood work was drawn on December 12, 2016. Calcium level 10.6. Parathyroid hormone 153. Glucose 97. I explained the lab work to her. I told her that the diagnosis of primary hyperparathyroidism was established and discussed the long-term consequences of this. She knows there is no emergency that she will need an operation at some point. She is relatively young and advised her to consider this selectively. After hearing the lab work she says that she wants to go ahead before the end of the year.    She will be scheduled for minimally invasive parathyroidectomy, possible 4 gland excision exploration..  She agrees with this plan.    Operative Findings:       I found a  parathyroid gland on the right side.  It was relatively posterior and I am not sure that this was a superior or inferior gland.  Closer to the middle thyroid vein and inferior thyroid vessels.  Following the induction of general endotracheal anesthesia the patient was positioned with a roll behind her shoulders and her arms tucked at her sides.  The neck and upper chest were prepped and draped in a sterile fashion.  There was a small amount of fatty tissue along the superior thyroid vessels and that may have been the superior gland and the inferior gland may have been hypertrophied.  This was almost 1 cm in diameter.  Frozen section confirmed parathyroid tissue.  The right recurrent laryngeal nerve was identified and preserved.  He was positioned  Procedure in Detail:          Following the induction of general endotracheal anesthesia the patient was positioned with her arms at her sides and a roll behind her shoulders and her neck extended.   Surgical timeout was performed.  Intravenous antibiotics were given.  0.5% Marcaine was used as local infiltration anesthetic.  The neck and upper chest were prepped and draped in sterile fashion.  I marked a transverse skin crease  incision and the incision was made from the midline laterally on the right for a minimally invasive procedure.  Dissection was taken down dividing the platysma muscle.  The platysmal muscle was dissected superiorly and inferiorly strap muscles were divided in the midline and dissected laterally off of the right thyroid lobe.  We mobilized the thyroid gland from lateral to medial.  Some small vessels were controlled with the harmonic scalpel and tiny metal clips.  Dissection around the superior pole was unrevealing.  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  This was carefully dissected away.  Had a smooth brown appearance.  This was sent to the lab.  The pathologist did a frozen  section and confirmed that this was parathyroid tissue.  We felt that nothing further needed to be done.  The wound was irrigated.  Hemostasis was excellent.  Fibrillar hemostatic sponge was placed in the paratracheal area and on top of the thyroid gland.  The strap muscles were closed in the midline with interrupted 3-0 Vicryl sutures.  The platysma muscle was closed with interrupted 3-0 Vicryl sutures.  The skin was closed with a running subcuticular 4-0 Monocryl and Steri-Strips.  The patient tolerated the procedure well and was taken to PACU in stable condition.  EBL 15 cc.  Counts correct.  Complications none.      Edsel Petrin. Dalbert Batman, M.D., FACS General and Minimally Invasive Surgery Breast and Colorectal Surgery   Addendum: I logged on to the [facility] website and reviewed her prescription medication history  02/07/2017 9:20 AM

## 2017-02-07 NOTE — Discharge Instructions (Signed)
Make an appointment to see Dr. Dalbert Batman in 3 weeks  2 days prior to the office visit, have blood work drawn at [facility]. The blood work should be serum calcium level and parathyroid hormone level my office will help you arrange this.  If you develop any numbness or tingling in your fingers or around her mouth, this may be due to low calcium level If that happens ,start taking 6 Tums tablets 4 times a day and call the office and we will draw a calcium level     [facility], [place] [phone]  THYROID/ PARATHYROID SURGERY: POST OP INSTRUCTIONS  Always review your discharge instruction sheet given to you by the facility where your surgery was performed.  IF YOU HAVE DISABILITY OR FAMILY LEAVE FORMS, YOU MUST BRING THEM TO THE OFFICE FOR PROCESSING.  PLEASE DO NOT GIVE THEM TO YOUR DOCTOR.  1. A prescription for pain medication may be given to you upon discharge.  Take your pain medication as prescribed, if needed.  If narcotic pain medicine is not needed, then you may take acetaminophen (Tylenol) or ibuprofen (Advil) as needed. 2. Take your usually prescribed medications unless otherwise directed. 3. If you need a refill on your pain medication, please contact your pharmacy. They will contact our office to request authorization.  Prescriptions will not be filled after 5pm or on week-ends. 4. You should follow a light diet the first 24 hours after arrival home, such as soup and crackers, etc.  Be sure to include lots of fluids daily.  Resume your normal diet the day after surgery. 5. Most patients will experience some swelling and bruising on the chest and neck area.  Ice packs will help.  Swelling and bruising can take several days to resolve.  6. It is common to experience some constipation if taking pain medication after surgery.  Increasing fluid intake and taking a stool softener will usually help or prevent this problem from occurring.  A mild laxative (Milk of Magnesia or  Miralax) should be taken according to package directions if there are no bowel movements after 48 hours. 7. Unless discharge instructions indicate otherwise, you may remove your bandages 24-48 hours after surgery, and you may shower at that time.  You may have steri-strips (small skin tapes) in place directly over the incision.  These strips should be left on the skin for 7-10 days.  If your surgeon used skin glue on the incision, you may shower in 24 hours.  The glue will flake off over the next 2-3 weeks.  Any sutures or staples will be removed at the office during your follow-up visit. 8. ACTIVITIES:  You may resume regular (light) daily activities beginning the next day--such as daily self-care, walking, climbing stairs--gradually increasing activities as tolerated.  You may have sexual intercourse when it is comfortable.  Refrain from any heavy lifting or straining until approved by your doctor. a. You may drive when you no longer are taking prescription pain medication, you can comfortably wear a seatbelt, and you can safely maneuver your car and apply brakes b. RETURN TO WORK:  __________________________________________________________ 9. You should see your doctor in the office for a follow-up appointment approximately two weeks after your surgery.  Make sure that you call for this appointment within a day or two after you arrive home to insure a convenient appointment time. 10. OTHER INSTRUCTIONS: ____________________________________________________________________________ _________________________________________________________________________________________________________________ _________________________________________________________________________________________________________________   WHEN TO CALL YOUR DOCTOR: 1. Fever over 101.0 2.  Inability to urinate 3. Nausea and/or vomiting 4. Extreme swelling or bruising 5. Continued bleeding from incision. 6. Increased pain, redness, or  drainage from the incision. 7. Difficulty swallowing or breathing 8. Muscle cramping or spasms. 9. Numbness or tingling in hands or feet or around lips.  The clinic staff is available to answer your questions during regular business hours.  Please dont hesitate to call and ask to speak to one of the nurses if you have concerns.  For further questions, please visit [email or web address]

## 2017-02-07 NOTE — Anesthesia Preprocedure Evaluation (Signed)
Anesthesia Evaluation  Patient identified by MRN, date of birth, ID band Patient awake    Reviewed: Allergy & Precautions, H&P , NPO status , Patient's Chart, lab work & pertinent test results, reviewed documented beta blocker date and time   History of Anesthesia Complications (+) PONV and history of anesthetic complications  Airway Mallampati: III  TM Distance: <3 FB Neck ROM: Full    Dental no notable dental hx.    Pulmonary neg pulmonary ROS,    breath sounds clear to auscultation + decreased breath sounds      Cardiovascular hypertension, Pt. on home beta blockers and Pt. on medications Normal cardiovascular exam Rhythm:Regular Rate:Normal  ECG: NSR, rate 74   Neuro/Psych negative neurological ROS  negative psych ROS   GI/Hepatic negative GI ROS, Neg liver ROS,   Endo/Other  Morbid obesity  Renal/GU negative Renal ROS     Musculoskeletal negative musculoskeletal ROS (+)   Abdominal (+) + obese,   Peds  Hematology negative hematology ROS (+)   Anesthesia Other Findings PRIMARY HYPERPARATHYROIDISM Super obese  Reproductive/Obstetrics                             Anesthesia Physical  Anesthesia Plan  ASA: IV  Anesthesia Plan: General   Post-op Pain Management:    Induction: Intravenous  PONV Risk Score and Plan: 3 and Dexamethasone, Ondansetron and Treatment may vary due to age or medical condition  Airway Management Planned: Oral ETT  Additional Equipment:   Intra-op Plan:   Post-operative Plan: Extubation in OR  Informed Consent: I have reviewed the patients History and Physical, chart, labs and discussed the procedure including the risks, benefits and alternatives for the proposed anesthesia with the patient or authorized representative who has indicated his/her understanding and acceptance.   Dental advisory given  Plan Discussed with: CRNA  Anesthesia Plan  Comments:         Anesthesia Quick Evaluation

## 2017-02-07 NOTE — Anesthesia Postprocedure Evaluation (Signed)
Anesthesia Post Note  Patient: Shannon [name]  Procedure(s) Performed: MINIMALLY INVASIVE PARATHYROIDECTOMY (Right Neck)     Patient location during evaluation: PACU Anesthesia Type: General Level of consciousness: awake and alert Pain management: pain level controlled Vital Signs Assessment: post-procedure vital signs reviewed and stable Respiratory status: spontaneous breathing, nonlabored ventilation, respiratory function stable and patient connected to nasal cannula oxygen Cardiovascular status: blood pressure returned to baseline and stable Postop Assessment: no apparent nausea or vomiting Anesthetic complications: no    Last Vitals:  Vitals:   02/07/17 1000 02/07/17 1147  BP: 128/75 125/69  Pulse: 68 66  Resp: 14   Temp: 36.6 C 36.4 C  SpO2: 93%     Last Pain:  Vitals:   02/07/17 1147  TempSrc: Oral  PainSc: 3                  [name]

## 2017-02-07 NOTE — Transfer of Care (Signed)
Immediate Anesthesia Transfer of Care Note  Patient: Shannon [name]  Procedure(s) Performed: MINIMALLY INVASIVE PARATHYROIDECTOMY (Right Neck)  Patient Location: PACU  Anesthesia Type:General  Level of Consciousness: awake, alert  and oriented  Airway & Oxygen Therapy: Patient Spontanous Breathing and Patient connected to face mask oxygen  Post-op Assessment: Report given to RN and Post -op Vital signs reviewed and stable  Post vital signs: Reviewed and stable  Last Vitals:  Vitals:   02/07/17 0545  BP: (!) 145/100  Pulse: 80  Resp: 18  Temp: 37 C  SpO2: 96%    Last Pain:  Vitals:   02/07/17 0545  TempSrc: Oral         Complications: No apparent anesthesia complications

## 2017-02-07 NOTE — Anesthesia Procedure Notes (Signed)
Procedure Name: Intubation Date/Time: 02/07/2017 7:36 AM Performed by: Glory Buff, CRNA Pre-anesthesia Checklist: Patient identified, Emergency Drugs available, Suction available and Patient being monitored Patient Re-evaluated:Patient Re-evaluated prior to induction Oxygen Delivery Method: Circle system utilized Preoxygenation: Pre-oxygenation with 100% oxygen Induction Type: IV induction Ventilation: Mask ventilation without difficulty Laryngoscope Size: Miller and 3 Grade View: Grade I Tube type: Oral Tube size: 7.5 mm Number of attempts: 1 Airway Equipment and Method: Stylet and Oral airway Placement Confirmation: ETT inserted through vocal cords under direct vision,  positive ETCO2 and breath sounds checked- equal and bilateral Secured at: 20 cm Tube secured with: Tape Dental Injury: Teeth and Oropharynx as per pre-operative assessment

## 2017-02-07 NOTE — Interval H&P Note (Signed)
History and Physical Interval Note:  02/07/2017 6:02 AM  Shannon [name]  has presented today for surgery, with the diagnosis of PRIMARY HYPERPARATHYROIDISM  The various methods of treatment have been discussed with the patient and family. After consideration of risks, benefits and other options for treatment, the patient has consented to  Procedure(s): MINIMALLY INVASIVE PARATHYROIDECTOMY (N/A) as a surgical intervention .  The patient's history has been reviewed, patient examined, no change in status, stable for surgery.  I have reviewed the patient's chart and labs.  Questions were answered to the patient's satisfaction.     [name]

## 2017-02-08 ENCOUNTER — Encounter (HOSPITAL_COMMUNITY): Payer: Self-pay | Admitting: General Surgery

## 2017-02-27 ENCOUNTER — Other Ambulatory Visit: Payer: Self-pay | Admitting: General Surgery

## 2017-02-27 DIAGNOSIS — E210 Primary hyperparathyroidism: Secondary | ICD-10-CM

## 2017-03-31 ENCOUNTER — Ambulatory Visit
Admission: RE | Admit: 2017-03-31 | Discharge: 2017-03-31 | Disposition: A | Discharge status: Home / Self Care | Payer: BC Managed Care – PPO | Source: Ambulatory Visit | Attending: General Surgery | Admitting: General Surgery

## 2017-03-31 DIAGNOSIS — E210 Primary hyperparathyroidism: Secondary | ICD-10-CM

## 2017-04-08 ENCOUNTER — Other Ambulatory Visit: Payer: Self-pay | Admitting: General Surgery

## 2017-04-08 DIAGNOSIS — E041 Nontoxic single thyroid nodule: Secondary | ICD-10-CM

## 2017-04-10 ENCOUNTER — Other Ambulatory Visit: Payer: Self-pay | Admitting: Gynecologic Oncology

## 2017-04-17 ENCOUNTER — Other Ambulatory Visit (HOSPITAL_COMMUNITY)
Admission: RE | Admit: 2017-04-17 | Discharge: 2017-04-17 | Disposition: A | Discharge status: Home / Self Care | Payer: BC Managed Care – PPO | Source: Ambulatory Visit | Attending: Radiology | Admitting: Radiology

## 2017-04-17 ENCOUNTER — Ambulatory Visit
Admission: RE | Admit: 2017-04-17 | Discharge: 2017-04-17 | Disposition: A | Discharge status: Home / Self Care | Payer: BC Managed Care – PPO | Source: Ambulatory Visit | Attending: General Surgery | Admitting: General Surgery

## 2017-04-17 DIAGNOSIS — E042 Nontoxic multinodular goiter: Secondary | ICD-10-CM | POA: Insufficient documentation

## 2017-04-17 DIAGNOSIS — E041 Nontoxic single thyroid nodule: Secondary | ICD-10-CM

## 2017-04-21 NOTE — Progress Notes (Signed)
Inform patient of Pathology report,. Thyroid cytology shows no cancer cells. Recommend repeat thyroid ultrasound yearly for 3-4 years and see me in one year   hmi

## 2017-05-06 ENCOUNTER — Encounter: Payer: Self-pay | Admitting: Family Medicine

## 2017-05-06 DIAGNOSIS — E042 Nontoxic multinodular goiter: Secondary | ICD-10-CM | POA: Insufficient documentation

## 2017-05-14 ENCOUNTER — Other Ambulatory Visit: Payer: Self-pay | Admitting: Family Medicine

## 2017-06-16 ENCOUNTER — Other Ambulatory Visit: Payer: Self-pay | Admitting: Family Medicine

## 2017-07-08 ENCOUNTER — Other Ambulatory Visit: Payer: Self-pay | Admitting: Family Medicine

## 2017-07-08 MED ORDER — VENLAFAXINE HCL ER 75 MG PO CP24: ORAL_CAPSULE | ORAL | 0 refills | Status: DC

## 2017-07-28 ENCOUNTER — Other Ambulatory Visit: Payer: Self-pay | Admitting: Family Medicine

## 2017-07-29 ENCOUNTER — Encounter: Payer: Self-pay | Admitting: Family Medicine

## 2017-07-29 ENCOUNTER — Ambulatory Visit (INDEPENDENT_AMBULATORY_CARE_PROVIDER_SITE_OTHER): Payer: BC Managed Care – PPO | Admitting: Family Medicine

## 2017-07-29 VITALS — BP 146/80 | HR 78 | Temp 98.4°F | Resp 18 | Ht 62.0 in | Wt 313.0 lb

## 2017-07-29 DIAGNOSIS — Z0000 Encounter for general adult medical examination without abnormal findings: Secondary | ICD-10-CM

## 2017-07-29 DIAGNOSIS — I10 Essential (primary) hypertension: Secondary | ICD-10-CM | POA: Diagnosis not present

## 2017-07-29 DIAGNOSIS — E210 Primary hyperparathyroidism: Secondary | ICD-10-CM | POA: Diagnosis not present

## 2017-07-29 DIAGNOSIS — C569 Malignant neoplasm of unspecified ovary: Secondary | ICD-10-CM | POA: Diagnosis not present

## 2017-07-29 DIAGNOSIS — C541 Malignant neoplasm of endometrium: Secondary | ICD-10-CM

## 2017-07-29 LAB — CBC WITH DIFFERENTIAL/PLATELET
BASOS ABS: 60 {cells}/uL (ref 0–200)
Basophils Relative: 1 %
EOS PCT: 2.3 %
Eosinophils Absolute: 138 cells/uL (ref 15–500)
HCT: 42 % (ref 35.0–45.0)
Hemoglobin: 13.9 g/dL (ref 11.7–15.5)
LYMPHS ABS: 1524 {cells}/uL (ref 850–3900)
MCH: 28 pg (ref 27.0–33.0)
MCHC: 33.1 g/dL (ref 32.0–36.0)
MCV: 84.5 fL (ref 80.0–100.0)
MONOS PCT: 8.2 %
MPV: 10.1 fL (ref 7.5–12.5)
NEUTROS PCT: 63.1 %
Neutro Abs: 3786 cells/uL (ref 1500–7800)
Platelets: 269 10*3/uL (ref 140–400)
RBC: 4.97 10*6/uL (ref 3.80–5.10)
RDW: 12.5 % (ref 11.0–15.0)
TOTAL LYMPHOCYTE: 25.4 %
WBC mixed population: 492 cells/uL (ref 200–950)
WBC: 6 10*3/uL (ref 3.8–10.8)

## 2017-07-29 LAB — LIPID PANEL
CHOL/HDL RATIO: 2.9 (calc) (ref <5.0)
Cholesterol: 164 mg/dL (ref <200)
HDL: 57 mg/dL (ref >50)
LDL Cholesterol (Calc): 90 mg/dL (calc)
NON-HDL CHOLESTEROL (CALC): 107 mg/dL (ref <130)
Triglycerides: 84 mg/dL (ref <150)

## 2017-07-29 LAB — COMPLETE METABOLIC PANEL WITH GFR
AG Ratio: 1.8 (calc) (ref 1.0–2.5)
ALBUMIN MSPROF: 4.6 g/dL (ref 3.6–5.1)
ALT: 12 U/L (ref 6–29)
AST: 16 U/L (ref 10–35)
Alkaline phosphatase (APISO): 76 U/L (ref 33–115)
BILIRUBIN TOTAL: 0.6 mg/dL (ref 0.2–1.2)
BUN: 8 mg/dL (ref 7–25)
CHLORIDE: 104 mmol/L (ref 98–110)
CO2: 25 mmol/L (ref 20–32)
Calcium: 9.4 mg/dL (ref 8.6–10.2)
Creat: 0.63 mg/dL (ref 0.50–1.10)
GFR, EST AFRICAN AMERICAN: 122 mL/min/{1.73_m2} (ref >=60)
GFR, Est Non African American: 105 mL/min/{1.73_m2} (ref >=60)
GLUCOSE: 82 mg/dL (ref 65–99)
Globulin: 2.5 g/dL (calc) (ref 1.9–3.7)
Potassium: 4.3 mmol/L (ref 3.5–5.3)
Sodium: 140 mmol/L (ref 135–146)
TOTAL PROTEIN: 7.1 g/dL (ref 6.1–8.1)

## 2017-07-29 NOTE — Progress Notes (Signed)
Subjective:    Patient ID: Shannon [name], female    DOB: September 10, 1965, 52 y.o.   MRN: [id number]  HPI   Patient is here today for complete physical exam.  Past medical history is significant for a total abdominal hysterectomy with bilateral salpingo-oophorectomy secondary to ovarian/endometrial cancer.  She follows up annually with her gynecologist to monitor this and is cancer free.  They perform her mammograms.  Her last mammogram was in August of last year and was normal.  Since I last saw the patient, she underwent a parathyroidectomy due to hyperparathyroidism and elevated serum calcium levels.  She is due today to recheck calcium.  She was also found to have a 2 cm right thyroid nodule but fine-needle aspiration was negative for any malignancy.  The plan is to follow this annually with thyroid ultrasounds.  Otherwise she is doing well.  Her blood pressure is minimally elevated today but she is asymptomatic.  She is not following any specific diet and she is not exercising Past Medical History:  Diagnosis Date  . Cancer [facility])    Uterine cancer dx. surgery planned TAH  . Hypertension    not taking med x1 yr. couldn't afford.  . Multiple thyroid nodules    - FNA 2019, surgery recommends annual [place]  . Ovarian cancer ([place])   . PONV (postoperative nausea and vomiting)   . Primary hyperparathyroidism ([place])   . Uterine cancer ([place])   . UTI (lower urinary tract infection)    history of 1 month ago   Past Surgical History:  Procedure Laterality Date  . CHOLECYSTECTOMY     laparoscopic  . PARATHYROIDECTOMY Right 02/07/2017   Procedure: MINIMALLY INVASIVE PARATHYROIDECTOMY;  Surgeon: Fanny Skates, MD;  Location: [facility];  Service: General;  Laterality: Right;  . ROBOTIC ASSISTED TOTAL HYSTERECTOMY WITH BILATERAL SALPINGO OOPHERECTOMY Bilateral 09/28/2013   Procedure: ROBOTIC ASSISTED TOTAL HYSTERECTOMY WITH BILATERAL SALPINGO OOPHORECTOMY WITH  LYMPH NODE DISECTION, ;  Surgeon: Everitt Amber,  MD;  Location: [facility];  Service: Gynecology;  Laterality: Bilateral;   Current Outpatient Medications on File Prior to Visit  Medication Sig Dispense Refill  . acetaminophen (TYLENOL) 325 MG tablet Take 650 mg by mouth every 6 (six) hours as needed for moderate pain or headache.    . carvedilol (COREG) 6.25 MG tablet TAKE 12.5 MG BY MOUTH TWICE DAILY WITH  A  MEAL 120 tablet 0  . estradiol (ESTRACE) 2 MG tablet TAKE 1 TABLET BY MOUTH ONCE DAILY 30 tablet 6  . venlafaxine XR (EFFEXOR-XR) 75 MG 24 hr capsule TAKE 1 CAPSULE BY MOUTH DAILY WITH BREAKFAST ( REQUIRES OFFICE VISIT BEFORE ANY FURTHER REFILLS CAN BE GIVEN) 30 capsule 0   No current facility-administered medications on file prior to visit.    Allergies  Allergen Reactions  . Contrast Media [Iodinated Diagnostic Agents] Anaphylaxis and Other (See Comments)    Swelling of the throat, difficulty breathing  . Tetanus Toxoids Swelling and Other (See Comments)    Swelling at site, lethargic x few days   Social History   Socioeconomic History  . Marital status: Single    Spouse name: Not on file  . Number of children: Not on file  . Years of education: Not on file  . Highest education level: Not on file  Occupational History  . Not on file  Social Needs  . Financial resource strain: Not on file  . Food insecurity:    Worry: Not on file    Inability: Not  on file  . Transportation needs:    Medical: Not on file    Non-medical: Not on file  Tobacco Use  . Smoking status: Never Smoker  . Smokeless tobacco: Never Used  Substance and Sexual Activity  . Alcohol use: No  . Drug use: No  . Sexual activity: Not Currently  Lifestyle  . Physical activity:    Days per week: Not on file    Minutes per session: Not on file  . Stress: Not on file  Relationships  . Social connections:    Talks on phone: Not on file    Gets together: Not on file    Attends religious service: Not on file    Active member of club or organization: Not  on file    Attends meetings of clubs or organizations: Not on file    Relationship status: Not on file  . Intimate partner violence:    Fear of current or ex partner: Not on file    Emotionally abused: Not on file    Physically abused: Not on file    Forced sexual activity: Not on file  Other Topics Concern  . Not on file  Social History Narrative  . Not on file      Review of Systems  All other systems reviewed and are negative.      Objective:   Physical Exam  Constitutional: She is oriented to person, place, and time. She appears well-developed and well-nourished.  HENT:  Head: Normocephalic and atraumatic.  Right Ear: Tympanic membrane, external ear and ear canal normal. No decreased hearing is noted.  Left Ear: Tympanic membrane, external ear and ear canal normal. No decreased hearing is noted.  Nose: Nose normal.  Mouth/Throat: Oropharynx is clear and moist. No oropharyngeal exudate.  Cardiovascular: Normal rate, regular rhythm and normal heart sounds. Exam reveals no gallop and no friction rub.  No murmur heard. Pulmonary/Chest: Effort normal and breath sounds normal. No respiratory distress. She has no wheezes. She has no rales. She exhibits no tenderness.  Neurological: She is alert and oriented to person, place, and time. She has normal reflexes. No cranial nerve deficit. She exhibits normal muscle tone. Coordination normal.  Vitals reviewed.         Assessment & Plan:  Primary hyperparathyroidism ([place])  Morbid obesity due to excess calories ([place]) - Plan: CBC with Differential/Platelet, COMPLETE METABOLIC PANEL WITH GFR, Lipid panel  Essential hypertension - Plan: CBC with Differential/Platelet, COMPLETE METABOLIC PANEL WITH GFR, Lipid panel  General medical exam  Malignant neoplasm of ovary, unspecified laterality [facility])  Endometrial ca [facility]) Patient will follow-up with her gynecologist as planned for her Pap smear and pelvic exam.  Her mammogram will be  scheduled in August and the patient will schedule this herself.  I would recommend a bone density test in her 60s given her history of a total abdominal hysterectomy/BSO and her recent history of hyperparathyroidism.  She is currently taking a women's vitamin with calcium and vitamin D.  Therefore I will monitor her calcium levels.  Her blood pressure slightly elevated and I am asked the patient to monitor her blood pressure on a daily basis and notify me in 2 weeks.  If greater than 140/90, we would need to address with medication.  I have recommended therapeutic lifestyle changes including a low calorie, low carbohydrate diet low in salt, 10 to 20 pounds of weight loss, and increasing aerobic exercise to 30 minutes a day 5 days a week.  Patient  declines HIV screening.  Otherwise immunizations are up-to-date.  She is allergic to the tetanus vaccine and will not receive that

## 2017-07-30 ENCOUNTER — Encounter (INDEPENDENT_AMBULATORY_CARE_PROVIDER_SITE_OTHER): Payer: Self-pay

## 2017-08-13 ENCOUNTER — Telehealth: Payer: Self-pay

## 2017-08-13 ENCOUNTER — Other Ambulatory Visit: Payer: Self-pay | Admitting: Family Medicine

## 2017-08-13 DIAGNOSIS — Z1231 Encounter for screening mammogram for malignant neoplasm of breast: Secondary | ICD-10-CM

## 2017-08-13 NOTE — Telephone Encounter (Signed)
Returned pt's call, she would like to schedule her f/u appt for October with Dr Denman George. Appt made for 10-24 at 3:15 pm.  Pt agreeable. No other needs per pt at this time.

## 2017-08-15 ENCOUNTER — Other Ambulatory Visit: Payer: Self-pay | Admitting: Family Medicine

## 2017-09-16 ENCOUNTER — Other Ambulatory Visit: Payer: Self-pay | Admitting: Family Medicine

## 2017-09-19 ENCOUNTER — Ambulatory Visit
Admission: RE | Admit: 2017-09-19 | Discharge: 2017-09-19 | Disposition: A | Discharge status: Home / Self Care | Payer: BC Managed Care – PPO | Source: Ambulatory Visit | Attending: Family Medicine | Admitting: Family Medicine

## 2017-09-19 DIAGNOSIS — Z1231 Encounter for screening mammogram for malignant neoplasm of breast: Secondary | ICD-10-CM

## 2017-11-04 ENCOUNTER — Other Ambulatory Visit: Payer: Self-pay | Admitting: Family Medicine

## 2017-11-20 ENCOUNTER — Encounter: Payer: Self-pay | Admitting: Gynecologic Oncology

## 2017-11-20 ENCOUNTER — Inpatient Hospital Stay: Payer: BC Managed Care – PPO | Attending: Gynecologic Oncology | Admitting: Gynecologic Oncology

## 2017-11-20 ENCOUNTER — Inpatient Hospital Stay: Payer: BC Managed Care – PPO

## 2017-11-20 VITALS — BP 142/85 | HR 91 | Temp 98.2°F | Resp 20 | Ht 62.0 in | Wt 314.0 lb

## 2017-11-20 DIAGNOSIS — Z8543 Personal history of malignant neoplasm of ovary: Secondary | ICD-10-CM | POA: Diagnosis not present

## 2017-11-20 DIAGNOSIS — Z90710 Acquired absence of both cervix and uterus: Secondary | ICD-10-CM | POA: Insufficient documentation

## 2017-11-20 DIAGNOSIS — Z90722 Acquired absence of ovaries, bilateral: Secondary | ICD-10-CM

## 2017-11-20 DIAGNOSIS — C541 Malignant neoplasm of endometrium: Secondary | ICD-10-CM | POA: Diagnosis not present

## 2017-11-20 NOTE — Progress Notes (Signed)
Office Visit:  GYN ONCOLOGY   ZJ:QBHAL IA G1 endometrial cancer Stage I low grade ovarian cancer  Surveillance  Assessment:   52 y.o. 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   S/p robotic hysterectomy, BSO and lymphadenectomy on 09/28/13. no LVSI, no myometrial invasion, negative pelvic washings and negative lymph nodes. Ovarian cancer was low grade, with no capsular involvement.   Progressive morbid obesity. Hyperparathyroidism  No evidence for recurrence on exam.   Plan:  CA 125 today Follow-up with Dr. Marvel Plan in 6 months F/U with Gyn Onc in 12 months. At that point, if NED, will discontinue surveillance visits.  Counseled on the signs and symptoms of recurrent disease  HPI:  Shannon [name] is a 52 y.o. year old. initially seen in consultation on 09/02/13 for grade 1 endometrial cancer.  She then underwent a robotic hysterectomy, BSO and lymphadenectomy on 09/30/77 without complications.  Her postoperative course was uncomplicated and she was discharged on postoperative day 1.  Her final pathologic diagnosis is a Stage IA Grade 1 endometrioid endometrial cancer with no lymphovascular space invasion, no myometrial invasion and negative lymph nodes. 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  She was seen on 10/08/13 for a postoperative check and to discuss her pathology results and treatment plan. She was found to be febrile with abdominal pain and was admitted to the hospital for further workup. A CT of the abdomen and pelvis was negative for abscess or pathology (it showed only fluid consistent with lymphatic fluid s/p lymphadenectomy).  CA 125 in January 2017 was normal at 5. CA 125 in October 2018 was normal at 6.3.  Interval Hx: She denies vaginal bleeding,  pelvic pain, abnormal discharge, bloating or change in GI habit.  Past Medical History:  Diagnosis Date  . Cancer [facility])    Uterine cancer dx. surgery planned TAH  . Hypertension    not taking med x1 yr. couldn't afford.  . Multiple thyroid nodules    - FNA 2019, surgery recommends annual [place]  . Ovarian cancer ([place])   . PONV (postoperative nausea and vomiting)   . Primary hyperparathyroidism ([place])   . Uterine cancer ([place])   . UTI (lower urinary tract infection)    history of 1 month ago   Past Surgical History:  Procedure Laterality Date  . CHOLECYSTECTOMY     laparoscopic  . PARATHYROIDECTOMY Right 02/07/2017   Procedure: MINIMALLY INVASIVE PARATHYROIDECTOMY;  Surgeon: Fanny Skates, MD;  Location[facility];  Service: General;  Laterality: Right;  . ROBOTIC ASSISTED TOTAL HYSTERECTOMY WITH BILATERAL SALPINGO OOPHERECTOMY Bilateral 09/28/2013   Procedure: ROBOTIC ASSISTED TOTAL HYSTERECTOMY WITH BILATERAL SALPINGO OOPHORECTOMY WITH  LYMPH NODE DISECTION, ;  Surgeon: Everitt Amber, MD;  Location: [facility];  Service: Gynecology;  Laterality: Bilateral;   Family History  Problem Relation Age of Onset  . Cancer Maternal Uncle 17       unknown type of cancer  . Cancer Paternal Aunt 49       bone cancer  . Cancer Maternal Uncle 75       colorectal cancer  . Cancer Maternal Uncle 75       throat cancer, smoker  . Cancer Cousin 76       mat first cousin with throat cancer   . Hyperparathyroidism Sister  Social History   Socioeconomic History  . Marital status: Single    Spouse name: Not on file  . Number of children: Not on file  . Years of education: Not on file  . Highest education level: Not on file  Occupational History  . Not on file  Social Needs  . Financial resource strain: Not on file  . Food insecurity:    Worry: Not on file    Inability: Not on file  . Transportation needs:    Medical: Not on file    Non-medical: Not on file  Tobacco Use  . Smoking status:  Never Smoker  . Smokeless tobacco: Never Used  Substance and Sexual Activity  . Alcohol use: No  . Drug use: No  . Sexual activity: Not Currently  Lifestyle  . Physical activity:    Days per week: Not on file    Minutes per session: Not on file  . Stress: Not on file  Relationships  . Social connections:    Talks on phone: Not on file    Gets together: Not on file    Attends religious service: Not on file    Active member of club or organization: Not on file    Attends meetings of clubs or organizations: Not on file    Relationship status: Not on file  Other Topics Concern  . Not on file  Social History Narrative  . Not on file    Review of systems: Constitutional:  She has no weight gain or weight loss. No fever or chills.  Eyes: No blurred vision Ears, Nose, Mouth, Throat: No dizziness, headaches or changes in hearing. No mouth sores. Cardiovascular: No chest pain, palpitations or edema. Respiratory:  No shortness of breath, wheezing or cough Gastrointestinal: She has normal bowel movements without diarrhea or constipation. She denies any nausea or vomiting. She denies blood in her stool or heart burn. Genitourinary:  See HPI, She has no hematuria, but does have dysuria Musculoskeletal: Denies muscle weakness or joint pains.  Skin:  She has no skin changes, rashes or itching Neurological:  Denies dizziness or headaches. No neuropathy, no numbness or tingling. Psychiatric:  She denies depression or anxiety. Hematologic/Lymphatic:   No easy bruising or bleeding   Physical Exam: Blood pressure (!) 142/85, pulse 91, temperature 98.2 F (36.8 C), temperature source Oral, resp. rate 20, height 5\' 2"  (1.575 m), weight (!) 314 lb (142.4 kg), last menstrual period 08/06/2013, SpO2 96 %. Body mass index is 57.43 kg/m.  General: Well dressed, well nourished in no apparent distress.   HEENT:  Normocephalic and atraumatic, no lesions.  Extraocular muscles intact. Sclerae anicteric.  Pupils equal, round, reactive.  Skin:  No lesions or rashes. Lungs:  Clear to auscultation bilaterally.  No wheezes. Cardiovascular:  Regular rate and rhythm.  No murmurs or rubs. Mildly tachycardic Abdomen:  Soft, generally tender, nondistended but obese.  No palpable masses.  No hepatosplenomegaly.  No ascites. Normal bowel sounds.  No hernias.  Incisions are intact, not draining, healing normally with no signs of cellulitis. Genitourinary: Normal EGBUS  Vaginal cuff intact.  No bleeding or discharge, no lesions.  There are no lesions or separations of the cuff. Rectal:  Good tone, no masses. Extremities: No cyanosis, clubbing or edema.  No calf tenderness or erythema. No palpable cords. Psychiatric: Mood and affect are appropriate. Neurological: Awake, alert and oriented x 3. Sensation is intact, no neuropathy.  Musculoskeletal: No pain, normal strength and range of motion.  Terrence Dupont  Pamella Pert, MD

## 2017-11-20 NOTE — Patient Instructions (Signed)
We will contact you with the results of your CA 125 from today.  Plan to follow up in one year or sooner if needed.  Please call our office in June or July 2020 at [phone] to schedule an appointment for October 2020.  Please call the office for any new symptoms such as abdominal bloating, pain, bleeding, etc.

## 2017-11-21 LAB — CA 125: Cancer Antigen (CA) 125: 5.5 U/mL (ref 0.0–38.1)

## 2017-11-24 ENCOUNTER — Telehealth: Payer: Self-pay

## 2017-11-24 NOTE — Telephone Encounter (Signed)
Per Joylene John NP - outgoing call to patient regarding CA 125 as "stable and within normal limits" - no answer- left VM to call our office.

## 2017-11-25 ENCOUNTER — Telehealth: Payer: Self-pay

## 2017-11-25 NOTE — Telephone Encounter (Signed)
Attempted pt's number again today regarding CA 125 results as stable and within normal limits per M. Cross NP.  Home number rang several times and then called mobile, no answer, no option to leave VM.

## 2017-11-26 ENCOUNTER — Telehealth: Payer: Self-pay | Admitting: *Deleted

## 2017-11-26 NOTE — Telephone Encounter (Signed)
Called patient to inform her of her CA 125 results.  Per Joylene John, NP CA 125 results are stable and within normal limits.  Patient verbalized understanding.

## 2017-12-15 ENCOUNTER — Ambulatory Visit: Payer: BC Managed Care – PPO | Admitting: Family Medicine

## 2017-12-15 ENCOUNTER — Encounter: Payer: Self-pay | Admitting: Family Medicine

## 2017-12-15 VITALS — BP 138/92 | HR 84 | Temp 98.4°F | Resp 16 | Ht 62.0 in | Wt 316.2 lb

## 2017-12-15 DIAGNOSIS — J0100 Acute maxillary sinusitis, unspecified: Secondary | ICD-10-CM | POA: Diagnosis not present

## 2017-12-15 MED ORDER — CARVEDILOL 6.25 MG PO TABS: ORAL_TABLET | ORAL | 6 refills | Status: DC

## 2017-12-15 MED ORDER — AMOXICILLIN-POT CLAVULANATE 875-125 MG PO TABS: 1.0000 | ORAL_TABLET | Freq: Two times a day (BID) | ORAL | 0 refills | Status: DC

## 2017-12-15 MED ORDER — FLUCONAZOLE 150 MG PO TABS: 150.0000 mg | ORAL_TABLET | Freq: Once | ORAL | 0 refills | Status: AC

## 2017-12-15 NOTE — Patient Instructions (Addendum)
Take antibiotics Use nasal rinse Use claritin/zyrtec  Give Note for Today  F/U as needed

## 2017-12-15 NOTE — Progress Notes (Signed)
   Subjective:    Patient ID: Shannon [name], female    DOB: [birth year], 52 y.o.   MRN: [id number]  Patient presents for Sinusitis   Pt here with left sided facial pain in sinus region and above eye. She had had 2 episodes of this lasting a week over the past month . Had a little red eye on left side but did not last long  Has used sudafed and and nyquil which helped her drain some mostly down her throat, but not completely clearing her symptoms No fever, has had some fatigue and headache.  No significant cough    Medications reviewed  She also needs her carvedilol refilled   Review Of Systems:  GEN- denies fatigue, fever, weight loss,weakness, recent illness HEENT- denies eye drainage, change in vision,+ nasal discharge, CVS- denies chest pain, palpitations RESP- denies SOB, cough, wheeze ABD- denies N/V, change in stools, abd pain MSK- denies joint pain, muscle aches, injury Neuro- + headache, denies dizziness, syncope, seizure activity       Objective:    BP (!) 138/92   Pulse 84   Temp 98.4 F (36.9 C) (Oral)   Resp 16   Ht 5\' 2"  (1.575 m)   Wt (!) 316 lb 4 oz (143.5 kg)   LMP 08/06/2013 Comment: off/on very heavy at times  SpO2 95%   BMI 57.84 kg/m  GEN- NAD, alert and oriented x3 HEENT- PERRL, EOMI, non injected sclera, pink conjunctiva, MMM, oropharynx mild injection, TM clear bilat no effusion,  + left  maxillary sinus tenderness, inflammed turbinates,  Nasal drainage  Neck- Supple, shotty left ant cervical LAD CVS- RRR, no murmur RESP-CTAB EXT- No edema Pulses- Radial 2+         Assessment & Plan:      Problem List Items Addressed This Visit    None    Visit Diagnoses    Acute non-recurrent maxillary sinusitis    -  Primary   Recurrent symptoms, treat augmentin, nasal saline/steroid, anti-histamine, avoid decongestnat due to HTN. Diflucan due to yeast after antibiotics   Relevant Medications   amoxicillin-clavulanate (AUGMENTIN) 875-125 MG  tablet   fluconazole (DIFLUCAN) 150 MG tablet      Note: This dictation was prepared with Dragon dictation along with smaller phrase technology. Any transcriptional errors that result from this process are unintentional.

## 2018-03-24 ENCOUNTER — Telehealth: Payer: Self-pay | Admitting: Family Medicine

## 2018-03-24 DIAGNOSIS — Z1211 Encounter for screening for malignant neoplasm of colon: Secondary | ICD-10-CM

## 2018-03-24 NOTE — Telephone Encounter (Signed)
ok 

## 2018-03-24 NOTE — Telephone Encounter (Signed)
OK to do referral and is it just for screening?

## 2018-03-24 NOTE — Telephone Encounter (Signed)
Referral for gastroenterology for colonoscopy, dr p told her to call when she was ready and we would place referral.

## 2018-03-25 NOTE — Telephone Encounter (Signed)
Referral placed.

## 2018-04-09 ENCOUNTER — Other Ambulatory Visit: Payer: Self-pay

## 2018-04-09 ENCOUNTER — Telehealth: Payer: Self-pay

## 2018-04-09 DIAGNOSIS — Z1211 Encounter for screening for malignant neoplasm of colon: Secondary | ICD-10-CM

## 2018-04-09 MED ORDER — NA SULFATE-K SULFATE-MG SULF 17.5-3.13-1.6 GM/177ML PO SOLN: 1.0000 | Freq: Once | ORAL | 0 refills | Status: AC

## 2018-04-09 NOTE — Telephone Encounter (Signed)
Gastroenterology Pre-Procedure Review  Request Date: 04/16/18 Requesting Physician: Dr. Vicente Males  PATIENT REVIEW QUESTIONS: The patient responded to the following health history questions as indicated:    1. Are you having any GI issues? yes (Diarrhea and Nausea suspects stomach bug.  Offered an appt. Pt declined.) 2. Do you have a personal history of Polyps? no 3. Do you have a family history of Colon Cancer or Polyps? yes (polyps-father, colon cancer maternal uncles) 4. Diabetes Mellitus? no 5. Joint replacements in the past 12 months?NO 6. Major health problems in the past 3 months?Parathyroid Surgery 02/07/17 7. Any artificial heart valves, MVP, or defibrillator?no    MEDICATIONS & ALLERGIES:    Patient reports the following regarding taking any anticoagulation/antiplatelet therapy:   Plavix, Coumadin, Eliquis, Xarelto, Lovenox, Pradaxa, Brilinta, or Effient? no Aspirin? no  Patient confirms/reports the following medications:  Current Outpatient Medications  Medication Sig Dispense Refill  . acetaminophen (TYLENOL) 325 MG tablet Take 650 mg by mouth every 6 (six) hours as needed for moderate pain or headache.    Marland Kitchen amoxicillin-clavulanate (AUGMENTIN) 875-125 MG tablet Take 1 tablet by mouth 2 (two) times daily. 20 tablet 0  . carvedilol (COREG) 6.25 MG tablet TAKE 2 TABLET BY MOUTH TWICE DAILY WITH  A  MEAL. REQUIRES OFFICE VISIT BEFORE ANY FURTHER REFILLS CAN BE GIVEN. 120 tablet 6  . estradiol (ESTRACE) 2 MG tablet TAKE 1 TABLET BY MOUTH ONCE DAILY 30 tablet 6  . Na Sulfate-K Sulfate-Mg Sulf 17.5-3.13-1.6 GM/177ML SOLN Take 1 kit by mouth once for 1 dose. 354 mL 0  . venlafaxine XR (EFFEXOR-XR) 75 MG 24 hr capsule TAKE 1 CAPSULE BY MOUTH ONCE DAILY WITH BREAKFAST 90 capsule 3   No current facility-administered medications for this visit.     Patient confirms/reports the following allergies:  Allergies  Allergen Reactions  . Contrast Media [Iodinated Diagnostic Agents] Anaphylaxis  and Other (See Comments)    Swelling of the throat, difficulty breathing  . Tetanus Toxoids Swelling and Other (See Comments)    Swelling at site, lethargic x few days    No orders of the defined types were placed in this encounter.   AUTHORIZATION INFORMATION Primary Insurance: 1D#: Group #:  Secondary Insurance: 1D#: Group #:  SCHEDULE INFORMATION: Date: 04/16/18 Time: Location: [place]

## 2018-04-16 ENCOUNTER — Encounter: Admission: RE | Payer: Self-pay | Source: Ambulatory Visit

## 2018-04-16 ENCOUNTER — Ambulatory Visit
Admission: RE | Admit: 2018-04-16 | Payer: BC Managed Care – PPO | Source: Ambulatory Visit | Admitting: Gastroenterology

## 2018-04-16 SURGERY — COLONOSCOPY WITH PROPOFOL
Anesthesia: General

## 2018-07-23 ENCOUNTER — Other Ambulatory Visit: Payer: Self-pay | Admitting: Family Medicine

## 2018-07-28 ENCOUNTER — Other Ambulatory Visit: Payer: Self-pay | Admitting: General Surgery

## 2018-07-28 DIAGNOSIS — E210 Primary hyperparathyroidism: Secondary | ICD-10-CM

## 2018-08-06 ENCOUNTER — Ambulatory Visit
Admission: RE | Admit: 2018-08-06 | Discharge: 2018-08-06 | Disposition: A | Discharge status: Home / Self Care | Payer: BC Managed Care – PPO | Source: Ambulatory Visit | Attending: General Surgery | Admitting: General Surgery

## 2018-08-06 DIAGNOSIS — E210 Primary hyperparathyroidism: Secondary | ICD-10-CM

## 2018-09-22 ENCOUNTER — Other Ambulatory Visit: Payer: Self-pay | Admitting: Family Medicine

## 2018-11-01 ENCOUNTER — Other Ambulatory Visit: Payer: Self-pay | Admitting: Family Medicine

## 2018-11-23 ENCOUNTER — Other Ambulatory Visit: Payer: Self-pay | Admitting: Family Medicine

## 2018-11-23 DIAGNOSIS — Z1231 Encounter for screening mammogram for malignant neoplasm of breast: Secondary | ICD-10-CM

## 2018-11-24 ENCOUNTER — Ambulatory Visit
Admission: RE | Admit: 2018-11-24 | Discharge: 2018-11-24 | Disposition: A | Discharge status: Home / Self Care | Payer: BC Managed Care – PPO | Source: Ambulatory Visit | Attending: Family Medicine | Admitting: Family Medicine

## 2018-11-24 ENCOUNTER — Other Ambulatory Visit: Payer: Self-pay

## 2018-11-24 DIAGNOSIS — Z1231 Encounter for screening mammogram for malignant neoplasm of breast: Secondary | ICD-10-CM

## 2019-02-02 ENCOUNTER — Other Ambulatory Visit: Payer: Self-pay | Admitting: Family Medicine

## 2019-07-31 ENCOUNTER — Other Ambulatory Visit: Payer: Self-pay | Admitting: Family Medicine

## 2019-08-17 ENCOUNTER — Other Ambulatory Visit: Payer: Self-pay | Admitting: Family Medicine

## 2019-08-17 MED ORDER — VENLAFAXINE HCL ER 75 MG PO CP24: ORAL_CAPSULE | ORAL | 0 refills | Status: DC

## 2019-08-17 NOTE — Telephone Encounter (Signed)
Medication filled x15 tabs with no refills.   Requires office visit before any further refills can be given.   Appointment scheduled.

## 2019-08-17 NOTE — Telephone Encounter (Signed)
CB# [phone] Refill Effexoro 75mg 

## 2019-08-24 ENCOUNTER — Other Ambulatory Visit: Payer: Self-pay

## 2019-08-24 ENCOUNTER — Encounter: Payer: Self-pay | Admitting: Family Medicine

## 2019-08-24 ENCOUNTER — Ambulatory Visit (INDEPENDENT_AMBULATORY_CARE_PROVIDER_SITE_OTHER): Payer: BC Managed Care – PPO | Admitting: Family Medicine

## 2019-08-24 VITALS — BP 132/80 | HR 90 | Temp 95.5°F | Ht 62.0 in | Wt 327.0 lb

## 2019-08-24 DIAGNOSIS — E042 Nontoxic multinodular goiter: Secondary | ICD-10-CM | POA: Diagnosis not present

## 2019-08-24 DIAGNOSIS — E210 Primary hyperparathyroidism: Secondary | ICD-10-CM

## 2019-08-24 DIAGNOSIS — I10 Essential (primary) hypertension: Secondary | ICD-10-CM

## 2019-08-24 DIAGNOSIS — Z13220 Encounter for screening for lipoid disorders: Secondary | ICD-10-CM | POA: Diagnosis not present

## 2019-08-24 DIAGNOSIS — Z1211 Encounter for screening for malignant neoplasm of colon: Secondary | ICD-10-CM

## 2019-08-24 MED ORDER — VENLAFAXINE HCL ER 75 MG PO CP24: ORAL_CAPSULE | ORAL | 3 refills | Status: DC

## 2019-08-24 NOTE — Progress Notes (Signed)
Subjective:    Patient ID: Shannon [name], female    DOB: 1965-03-10, 54 y.o.   MRN: [id number]  HPI  Since I last saw the patient, she underwent a parathyroidectomy due to hyperparathyroidism and elevated serum calcium levels.  She is due today to recheck calcium.  She was also found to have a 2 cm right thyroid nodule but fine-needle aspiration was negative for any malignancy.  The plan is to follow this annually with thyroid ultrasounds.  Her general surgeon have been performing this task however he has retired and she is now asking that we performed a thyroid ultrasound.  She is also due for a colonoscopy.  Blood pressure today is well controlled at 132/80.  She denies any chest pain shortness of breath or dyspnea on exertion. Past Medical History:  Diagnosis Date  . Cancer [facility])    Uterine cancer dx. surgery planned TAH  . Hypertension    not taking med x1 yr. couldn't afford.  . Multiple thyroid nodules    - FNA 2019, surgery recommends annual [place]  . Ovarian cancer ([place])   . PONV (postoperative nausea and vomiting)   . Primary hyperparathyroidism ([place])   . Uterine cancer ([place])   . UTI (lower urinary tract infection)    history of 1 month ago   Past Surgical History:  Procedure Laterality Date  . CHOLECYSTECTOMY     laparoscopic  . PARATHYROIDECTOMY Right 02/07/2017   Procedure: MINIMALLY INVASIVE PARATHYROIDECTOMY;  Surgeon: Shannon Skates, MD;  [facility];  Service: General;  Laterality: Right;  . ROBOTIC ASSISTED TOTAL HYSTERECTOMY WITH BILATERAL SALPINGO OOPHERECTOMY Bilateral 09/28/2013   Procedure: ROBOTIC ASSISTED TOTAL HYSTERECTOMY WITH BILATERAL SALPINGO OOPHORECTOMY WITH  LYMPH NODE DISECTION, ;  Surgeon: Shannon Amber, MD;  [facility];  Service: Gynecology;  Laterality: Bilateral;   Current Outpatient Medications on File Prior to Visit  Medication Sig Dispense Refill  . acetaminophen (TYLENOL) 325 MG tablet Take 650 mg by mouth every 6 (six) hours as needed  for moderate pain or headache.    . carvedilol (COREG) 6.25 MG tablet TAKE 2 TABLETS BY MOUTH TWICE A DAY WITH FOOD 360 tablet 0  . cetirizine (ZYRTEC) 10 MG tablet Take 10 mg by mouth daily.     No current facility-administered medications on file prior to visit.   Allergies  Allergen Reactions  . Contrast Media [Iodinated Diagnostic Agents] Anaphylaxis and Other (See Comments)    Swelling of the throat, difficulty breathing  . Tetanus Toxoids Swelling and Other (See Comments)    Swelling at site, lethargic x few days   Social History   Socioeconomic History  . Marital status: Single    Spouse name: Not on file  . Number of children: Not on file  . Years of education: Not on file  . Highest education level: Not on file  Occupational History  . Not on file  Tobacco Use  . Smoking status: Never Smoker  . Smokeless tobacco: Never Used  Vaping Use  . Vaping Use: Never used  Substance and Sexual Activity  . Alcohol use: No  . Drug use: No  . Sexual activity: Not Currently  Other Topics Concern  . Not on file  Social History Narrative  . Not on file   Social Determinants of Health   Financial Resource Strain:   . Difficulty of Paying Living Expenses:   Food Insecurity:   . Worried About Charity fundraiser in the Last Year:   .  Ran Out of Food in the Last Year:   Transportation Needs:   . Film/video editor (Medical):   Marland Kitchen Lack of Transportation (Non-Medical):   Physical Activity:   . Days of Exercise per Week:   . Minutes of Exercise per Session:   Stress:   . Feeling of Stress :   Social Connections:   . Frequency of Communication with Friends and Family:   . Frequency of Social Gatherings with Friends and Family:   . Attends Religious Services:   . Active Member of Clubs or Organizations:   . Attends Archivist Meetings:   Marland Kitchen Marital Status:   Intimate Partner Violence:   . Fear of Current or Ex-Partner:   . Emotionally Abused:   Marland Kitchen Physically  Abused:   . Sexually Abused:       Review of Systems  All other systems reviewed and are negative.      Objective:   Physical Exam Vitals reviewed.  Constitutional:      Appearance: She is well-developed.  HENT:     Head: Normocephalic and atraumatic.     Right Ear: Tympanic membrane, ear canal and external ear normal. No decreased hearing noted.     Left Ear: Tympanic membrane, ear canal and external ear normal. No decreased hearing noted.     Nose: Nose normal.     Mouth/Throat:     Pharynx: No oropharyngeal exudate.  Cardiovascular:     Rate and Rhythm: Normal rate and regular rhythm.     Heart sounds: Normal heart sounds. No murmur heard.  No friction rub. No gallop.   Pulmonary:     Effort: Pulmonary effort is normal. No respiratory distress.     Breath sounds: Normal breath sounds. No wheezing or rales.  Chest:     Chest wall: No tenderness.  Neurological:     Mental Status: She is alert and oriented to person, place, and time.     Cranial Nerves: No cranial nerve deficit.     Motor: No abnormal muscle tone.     Coordination: Coordination normal.     Deep Tendon Reflexes: Reflexes are normal and symmetric.           Assessment & Plan:  Primary hyperparathyroidism ([name]) - Plan: Parathyroid hormone, intact (no Ca), CBC with Differential/Platelet, COMPLETE METABOLIC PANEL WITH GFR, TSH  Screening cholesterol level - Plan: Parathyroid hormone, intact (no Ca), CBC with Differential/Platelet, COMPLETE METABOLIC PANEL WITH GFR, Lipid panel  Colon cancer screening - Plan: Ambulatory referral to Gastroenterology  Multiple thyroid nodules  Essential hypertension  I will monitor a CMP to monitor her calcium along with a PTH.  Given her history of multiple thyroid nodules, I will also obtain a thyroid ultrasound to monitor for any that require or warrant fine-needle aspiration.  Blood pressure today is adequately controlled.  Schedule the patient for a colonoscopy.   Given her history of hypertension I will also screen her cholesterol.  Regular anticipatory guidance is provided

## 2019-08-25 LAB — COMPLETE METABOLIC PANEL WITH GFR
AG Ratio: 1.8 (calc) (ref 1.0–2.5)
ALT: 11 U/L (ref 6–29)
AST: 14 U/L (ref 10–35)
Albumin: 4.5 g/dL (ref 3.6–5.1)
Alkaline phosphatase (APISO): 68 U/L (ref 37–153)
BUN: 12 mg/dL (ref 7–25)
CO2: 25 mmol/L (ref 20–32)
Calcium: 9.5 mg/dL (ref 8.6–10.4)
Chloride: 103 mmol/L (ref 98–110)
Creat: 0.76 mg/dL (ref 0.50–1.05)
GFR, Est African American: 105 mL/min/{1.73_m2} (ref >=60)
GFR, Est Non African American: 91 mL/min/{1.73_m2} (ref >=60)
Globulin: 2.5 g/dL (calc) (ref 1.9–3.7)
Glucose, Bld: 91 mg/dL (ref 65–99)
Potassium: 4.4 mmol/L (ref 3.5–5.3)
Sodium: 141 mmol/L (ref 135–146)
Total Bilirubin: 0.6 mg/dL (ref 0.2–1.2)
Total Protein: 7 g/dL (ref 6.1–8.1)

## 2019-08-25 LAB — CBC WITH DIFFERENTIAL/PLATELET
Absolute Monocytes: 549 cells/uL (ref 200–950)
Basophils Absolute: 47 cells/uL (ref 0–200)
Basophils Relative: 0.8 %
Eosinophils Absolute: 89 cells/uL (ref 15–500)
Eosinophils Relative: 1.5 %
HCT: 41.2 % (ref 35.0–45.0)
Hemoglobin: 13.9 g/dL (ref 11.7–15.5)
Lymphs Abs: 1581 cells/uL (ref 850–3900)
MCH: 28.1 pg (ref 27.0–33.0)
MCHC: 33.7 g/dL (ref 32.0–36.0)
MCV: 83.2 fL (ref 80.0–100.0)
MPV: 9.9 fL (ref 7.5–12.5)
Monocytes Relative: 9.3 %
Neutro Abs: 3634 cells/uL (ref 1500–7800)
Neutrophils Relative %: 61.6 %
Platelets: 258 10*3/uL (ref 140–400)
RBC: 4.95 10*6/uL (ref 3.80–5.10)
RDW: 12.6 % (ref 11.0–15.0)
Total Lymphocyte: 26.8 %
WBC: 5.9 10*3/uL (ref 3.8–10.8)

## 2019-08-25 LAB — LIPID PANEL
Cholesterol: 145 mg/dL (ref <200)
HDL: 51 mg/dL (ref >=50)
LDL Cholesterol (Calc): 77 mg/dL (calc)
Non-HDL Cholesterol (Calc): 94 mg/dL (calc) (ref <130)
Total CHOL/HDL Ratio: 2.8 (calc) (ref <5.0)
Triglycerides: 86 mg/dL (ref <150)

## 2019-08-25 LAB — TSH: TSH: 3.62 mIU/L

## 2019-08-25 LAB — PARATHYROID HORMONE, INTACT (NO CA): PTH: 34 pg/mL (ref 14–64)

## 2019-09-16 ENCOUNTER — Ambulatory Visit
Admission: RE | Admit: 2019-09-16 | Discharge: 2019-09-16 | Disposition: A | Discharge status: Home / Self Care | Payer: BC Managed Care – PPO | Source: Ambulatory Visit | Attending: Family Medicine | Admitting: Family Medicine

## 2019-09-16 DIAGNOSIS — E042 Nontoxic multinodular goiter: Secondary | ICD-10-CM

## 2019-12-29 ENCOUNTER — Other Ambulatory Visit: Payer: Self-pay | Admitting: Family Medicine

## 2019-12-29 ENCOUNTER — Other Ambulatory Visit: Payer: Self-pay

## 2019-12-29 ENCOUNTER — Ambulatory Visit
Admission: RE | Admit: 2019-12-29 | Discharge: 2019-12-29 | Disposition: A | Discharge status: Home / Self Care | Payer: BC Managed Care – PPO | Source: Ambulatory Visit | Attending: Family Medicine | Admitting: Family Medicine

## 2019-12-29 DIAGNOSIS — Z1231 Encounter for screening mammogram for malignant neoplasm of breast: Secondary | ICD-10-CM

## 2020-05-30 ENCOUNTER — Other Ambulatory Visit: Payer: Self-pay

## 2020-06-02 ENCOUNTER — Other Ambulatory Visit: Payer: Self-pay | Admitting: *Deleted

## 2020-06-02 MED ORDER — CARVEDILOL 6.25 MG PO TABS: 12.5000 mg | ORAL_TABLET | Freq: Two times a day (BID) | ORAL | 0 refills | Status: DC

## 2020-06-27 ENCOUNTER — Other Ambulatory Visit: Payer: Self-pay | Admitting: Family Medicine

## 2020-07-27 ENCOUNTER — Other Ambulatory Visit: Payer: Self-pay | Admitting: Family Medicine

## 2020-08-23 ENCOUNTER — Other Ambulatory Visit: Payer: Self-pay | Admitting: Family Medicine

## 2020-08-25 ENCOUNTER — Other Ambulatory Visit: Payer: Self-pay | Admitting: Family Medicine

## 2020-08-30 ENCOUNTER — Other Ambulatory Visit: Payer: BC Managed Care – PPO

## 2020-08-30 ENCOUNTER — Other Ambulatory Visit: Payer: Self-pay

## 2020-08-30 DIAGNOSIS — I10 Essential (primary) hypertension: Secondary | ICD-10-CM

## 2020-08-30 DIAGNOSIS — Z13220 Encounter for screening for lipoid disorders: Secondary | ICD-10-CM

## 2020-08-30 DIAGNOSIS — R7301 Impaired fasting glucose: Secondary | ICD-10-CM

## 2020-08-30 DIAGNOSIS — E210 Primary hyperparathyroidism: Secondary | ICD-10-CM

## 2020-08-31 LAB — CBC WITH DIFFERENTIAL/PLATELET
Absolute Monocytes: 432 cells/uL (ref 200–950)
Basophils Absolute: 72 cells/uL (ref 0–200)
Basophils Relative: 1.2 %
Eosinophils Absolute: 132 cells/uL (ref 15–500)
Eosinophils Relative: 2.2 %
HCT: 42.1 % (ref 35.0–45.0)
Hemoglobin: 14 g/dL (ref 11.7–15.5)
Lymphs Abs: 1554 cells/uL (ref 850–3900)
MCH: 28.3 pg (ref 27.0–33.0)
MCHC: 33.3 g/dL (ref 32.0–36.0)
MCV: 85.1 fL (ref 80.0–100.0)
MPV: 10.2 fL (ref 7.5–12.5)
Monocytes Relative: 7.2 %
Neutro Abs: 3810 cells/uL (ref 1500–7800)
Neutrophils Relative %: 63.5 %
Platelets: 251 10*3/uL (ref 140–400)
RBC: 4.95 10*6/uL (ref 3.80–5.10)
RDW: 12.4 % (ref 11.0–15.0)
Total Lymphocyte: 25.9 %
WBC: 6 10*3/uL (ref 3.8–10.8)

## 2020-08-31 LAB — HEMOGLOBIN A1C
Hgb A1c MFr Bld: 5.3 % of total Hgb (ref <5.7)
Mean Plasma Glucose: 105 mg/dL
eAG (mmol/L): 5.8 mmol/L

## 2020-08-31 LAB — LIPID PANEL
Cholesterol: 153 mg/dL (ref <200)
HDL: 60 mg/dL (ref >=50)
LDL Cholesterol (Calc): 75 mg/dL (calc)
Non-HDL Cholesterol (Calc): 93 mg/dL (calc) (ref <130)
Total CHOL/HDL Ratio: 2.6 (calc) (ref <5.0)
Triglycerides: 97 mg/dL (ref <150)

## 2020-08-31 LAB — PARATHYROID HORMONE, INTACT (NO CA): PTH: 53 pg/mL (ref 16–77)

## 2020-08-31 LAB — TSH: TSH: 3.63 mIU/L

## 2020-09-06 ENCOUNTER — Other Ambulatory Visit: Payer: Self-pay | Admitting: Family Medicine

## 2020-09-08 ENCOUNTER — Other Ambulatory Visit: Payer: Self-pay | Admitting: Family Medicine

## 2020-09-11 ENCOUNTER — Encounter: Payer: Self-pay | Admitting: Family Medicine

## 2020-09-11 ENCOUNTER — Other Ambulatory Visit: Payer: Self-pay

## 2020-09-11 ENCOUNTER — Ambulatory Visit (INDEPENDENT_AMBULATORY_CARE_PROVIDER_SITE_OTHER): Payer: BC Managed Care – PPO | Admitting: Family Medicine

## 2020-09-11 VITALS — BP 128/64 | HR 90 | Temp 98.1°F | Resp 14 | Ht 62.0 in | Wt 336.0 lb

## 2020-09-11 DIAGNOSIS — Z1211 Encounter for screening for malignant neoplasm of colon: Secondary | ICD-10-CM | POA: Diagnosis not present

## 2020-09-11 DIAGNOSIS — Z0001 Encounter for general adult medical examination with abnormal findings: Secondary | ICD-10-CM

## 2020-09-11 DIAGNOSIS — I10 Essential (primary) hypertension: Secondary | ICD-10-CM

## 2020-09-11 DIAGNOSIS — E042 Nontoxic multinodular goiter: Secondary | ICD-10-CM

## 2020-09-11 DIAGNOSIS — Z0000 Encounter for general adult medical examination without abnormal findings: Secondary | ICD-10-CM

## 2020-09-11 DIAGNOSIS — E210 Primary hyperparathyroidism: Secondary | ICD-10-CM

## 2020-09-11 NOTE — Progress Notes (Signed)
Subjective:    Patient ID: Shannon [name], female    DOB: [birth year], 55 y.o.   MRN: [id number]  HPI Patient is a very pleasant 55 year old Caucasian female who presents today for complete physical exam.  Past medical history significant for hyperparathyroidism.  She underwent a parathyroidectomy due to hyperparathyroidism and elevated serum calcium levels.  She is due today to recheck calcium.  She was also found to have a 2 cm right thyroid nodule but fine-needle aspiration was negative for any malignancy.  The plan is to follow this annually with thyroid ultrasounds.  Last thyroid ultrasound was performed last year.  She is due to repeat that today.  Mammogram is not due again until December.  She is still not yet had a colonoscopy.  Most recent lab work is listed below.  Blood pressure today is outstanding at 128/64.  My biggest concern based on her physical exam is a BMI of 61. Past Medical History:  Diagnosis Date   Cancer [facility])    Uterine cancer dx. surgery planned TAH   Hypertension    not taking med x1 yr. couldn't afford.   Multiple thyroid nodules    - FNA 2019, surgery recommends annual US   Ovarian cancer ([place])    PONV (postoperative nausea and vomiting)    Primary hyperparathyroidism ([place])    Uterine cancer ([place])    UTI (lower urinary tract infection)    history of 1 month ago   Past Surgical History:  Procedure Laterality Date   CHOLECYSTECTOMY     laparoscopic   PARATHYROIDECTOMY Right 02/07/2017   Procedure: MINIMALLY INVASIVE PARATHYROIDECTOMY;  Surgeon: Fanny Skates, MD;  [facility];  Service: General;  Laterality: Right;   ROBOTIC ASSISTED TOTAL HYSTERECTOMY WITH BILATERAL SALPINGO OOPHERECTOMY Bilateral 09/28/2013   Procedure: ROBOTIC ASSISTED TOTAL HYSTERECTOMY WITH BILATERAL SALPINGO OOPHORECTOMY WITH  LYMPH NODE DISECTION, ;  Surgeon: Everitt Amber, MD;  [facility];  Service: Gynecology;  Laterality: Bilateral;   Current Outpatient Medications  on File Prior to Visit  Medication Sig Dispense Refill   acetaminophen (TYLENOL) 325 MG tablet Take 650 mg by mouth every 6 (six) hours as needed for moderate pain or headache.     carvedilol (COREG) 6.25 MG tablet TAKE 2 TABLETS (12.5 MG) BY MOUTH 2 TIMES DAILY WITH A MEAL. 120 tablet 0   cetirizine (ZYRTEC) 10 MG tablet Take 10 mg by mouth daily.     venlafaxine XR (EFFEXOR-XR) 75 MG 24 hr capsule TAKE 1 CAPSULE EVERY MORNING WITH BREAKFAS. NEED OFFICE VISIT FOR MORE REFILLS 30 capsule 0   No current facility-administered medications on file prior to visit.   Allergies  Allergen Reactions   Contrast Media [Iodinated Diagnostic Agents] Anaphylaxis and Other (See Comments)    Swelling of the throat, difficulty breathing   Tetanus Toxoids Swelling and Other (See Comments)    Swelling at site, lethargic x few days   Social History   Socioeconomic History   Marital status: Single    Spouse name: Not on file   Number of children: Not on file   Years of education: Not on file   Highest education level: Not on file  Occupational History   Not on file  Tobacco Use   Smoking status: Never   Smokeless tobacco: Never  Vaping Use   Vaping Use: Never used  Substance and Sexual Activity   Alcohol use: No   Drug use: No   Sexual activity: Not Currently  Other Topics Concern  Not on file  Social History Narrative   Not on file   Social Determinants of Health   Financial Resource Strain: Not on file  Food Insecurity: Not on file  Transportation Needs: Not on file  Physical Activity: Not on file  Stress: Not on file  Social Connections: Not on file  Intimate Partner Violence: Not on file      Review of Systems  All other systems reviewed and are negative.     Objective:   Physical Exam Vitals reviewed.  Constitutional:      Appearance: She is well-developed.  HENT:     Head: Normocephalic and atraumatic.     Right Ear: Tympanic membrane, ear canal and external ear  normal. No decreased hearing noted.     Left Ear: Tympanic membrane, ear canal and external ear normal. No decreased hearing noted.     Nose: Nose normal.     Mouth/Throat:     Pharynx: No oropharyngeal exudate.  Cardiovascular:     Rate and Rhythm: Normal rate and regular rhythm.     Heart sounds: Normal heart sounds. No murmur heard.   No friction rub. No gallop.  Pulmonary:     Effort: Pulmonary effort is normal. No respiratory distress.     Breath sounds: Normal breath sounds. No wheezing or rales.  Chest:     Chest wall: No tenderness.  Neurological:     Mental Status: She is alert and oriented to person, place, and time.     Cranial Nerves: No cranial nerve deficit.     Motor: No abnormal muscle tone.     Coordination: Coordination normal.     Deep Tendon Reflexes: Reflexes are normal and symmetric.    Lab on 08/30/2020  Component Date Value Ref Range Status   Cholesterol 08/30/2020 153  <200 mg/dL Final   HDL 08/30/2020 60  > OR = 50 mg/dL Final   Triglycerides 08/30/2020 97  <150 mg/dL Final   LDL Cholesterol (Calc) 08/30/2020 75  mg/dL (calc) Final   Comment: Reference range: <100 . Desirable range <100 mg/dL for primary prevention;   <70 mg/dL for patients with CHD or diabetic patients  with > or = 2 CHD risk factors. Marland Kitchen LDL-C is now calculated using the Martin-Hopkins  calculation, which is a validated novel method providing  better accuracy than the Friedewald equation in the  estimation of LDL-C.  Cresenciano Genre et al. Annamaria Helling. [id number]): 2061-2068  (http://education.[facility].com/faq/FAQ164)    Total CHOL/HDL Ratio 08/30/2020 2.6  <5.0 (calc) Final   Non-HDL Cholesterol (Calc) 08/30/2020 93  <130 mg/dL (calc) Final   Comment: For patients with diabetes plus 1 major ASCVD risk  factor, treating to a non-HDL-C goal of <100 mg/dL  (LDL-C of <70 mg/dL) is considered a therapeutic  option.    Hgb A1c MFr Bld 08/30/2020 5.3  <5.7 % of total Hgb Final    Comment: For the purpose of screening for the presence of diabetes: . <5.7%       Consistent with the absence of diabetes 5.7-6.4%    Consistent with increased risk for diabetes             (prediabetes) > or =6.5%  Consistent with diabetes . This assay result is consistent with a decreased risk of diabetes. . Currently, no consensus exists regarding use of hemoglobin A1c for diagnosis of diabetes in children. . According to American Diabetes Association (ADA) guidelines, hemoglobin A1c <7.0% represents optimal control in non-pregnant diabetic patients. Different metrics  may apply to specific patient populations.  Standards of Medical Care in Diabetes(ADA). .    Mean Plasma Glucose 08/30/2020 105  mg/dL Final   eAG (mmol/L) 08/30/2020 5.8  mmol/L Final   WBC 08/30/2020 6.0  3.8 - 10.8 Thousand/uL Final   RBC 08/30/2020 4.95  3.80 - 5.10 Million/uL Final   Hemoglobin 08/30/2020 14.0  11.7 - 15.5 g/dL Final   HCT 08/30/2020 42.1  35.0 - 45.0 % Final   MCV 08/30/2020 85.1  80.0 - 100.0 fL Final   MCH 08/30/2020 28.3  27.0 - 33.0 pg Final   MCHC 08/30/2020 33.3  32.0 - 36.0 g/dL Final   RDW 08/30/2020 12.4  11.0 - 15.0 % Final   Platelets 08/30/2020 251  140 - 400 Thousand/uL Final   MPV 08/30/2020 10.2  7.5 - 12.5 fL Final   Neutro Abs 08/30/2020 3,810  1,500 - 7,800 cells/uL Final   Lymphs Abs 08/30/2020 1,554  850 - 3,900 cells/uL Final   Absolute Monocytes 08/30/2020 432  200 - 950 cells/uL Final   Eosinophils Absolute 08/30/2020 132  15 - 500 cells/uL Final   Basophils Absolute 08/30/2020 72  0 - 200 cells/uL Final   Neutrophils Relative % 08/30/2020 63.5  % Final   Total Lymphocyte 08/30/2020 25.9  % Final   Monocytes Relative 08/30/2020 7.2  % Final   Eosinophils Relative 08/30/2020 2.2  % Final   Basophils Relative 08/30/2020 1.2  % Final   TSH 08/30/2020 3.63  mIU/L Final   Comment:           Reference Range .           > or = 20 Years  0.40-4.50 .                 Pregnancy Ranges           First trimester    0.26-2.66           Second trimester   0.55-2.73           Third trimester    0.43-2.91    PTH 08/30/2020 53  16 - 77 pg/mL Final   Comment: . Interpretive Guide    Intact PTH           Calcium ------------------    ----------           ------- Normal Parathyroid    Normal               Normal Hypoparathyroidism    Low or Low Normal    Low Hyperparathyroidism    Primary            Normal or High       High    Secondary          High                 Normal or Low    Tertiary           High                 High Non-Parathyroid    Hypercalcemia      Low or Low Normal    High .          Assessment & Plan:  Multinodular goiter - Plan: US THYROID  Colon cancer screening - Plan: Ambulatory referral to Gastroenterology  Essential hypertension  Primary hyperparathyroidism ([place])  General medical exam Patient will have an ultrasound of the thyroid to monitor  the nodules.  I will schedule her for a colonoscopy for colon cancer screening.  Blood pressures acceptable.  Lab work is outstanding.  GYN performs pelvic exam.  Strongly recommended diet exercise and weight loss.  Also discussed GLP-1 agonist as a means to achieve weight loss.

## 2020-09-13 ENCOUNTER — Other Ambulatory Visit: Payer: Self-pay | Admitting: Family Medicine

## 2020-09-14 ENCOUNTER — Encounter: Payer: Self-pay | Admitting: *Deleted

## 2020-12-13 ENCOUNTER — Encounter: Payer: Self-pay | Admitting: Gastroenterology

## 2020-12-29 ENCOUNTER — Encounter: Payer: Self-pay | Admitting: Gastroenterology

## 2020-12-29 ENCOUNTER — Ambulatory Visit (INDEPENDENT_AMBULATORY_CARE_PROVIDER_SITE_OTHER): Payer: BC Managed Care – PPO | Admitting: Gastroenterology

## 2020-12-29 VITALS — BP 112/78 | HR 90 | Ht 62.0 in | Wt 332.0 lb

## 2020-12-29 DIAGNOSIS — K529 Noninfective gastroenteritis and colitis, unspecified: Secondary | ICD-10-CM | POA: Diagnosis not present

## 2020-12-29 DIAGNOSIS — Z1211 Encounter for screening for malignant neoplasm of colon: Secondary | ICD-10-CM | POA: Diagnosis not present

## 2020-12-29 NOTE — Patient Instructions (Signed)
Start Imodium daily and titrate to as needed.  You have been scheduled for a colonoscopy. Please follow written instructions given to you at your visit today.  Please pick up your prep supplies at the pharmacy within the next 1-3 days. If you use inhalers (even only as needed), please bring them with you on the day of your procedure.  If you are age 53 or older, your body mass index should be between 23-30. Your Body mass index is 60.72 kg/m. If this is out of the aforementioned range listed, please consider follow up with your Primary Care Provider.  If you are age 27 or younger, your body mass index should be between 19-25. Your Body mass index is 60.72 kg/m. If this is out of the aformentioned range listed, please consider follow up with your Primary Care Provider.   ________________________________________________________  The [facility] providers would like to encourage you to use [facility] to communicate with providers for non-urgent requests or questions.  Due to long hold times on the telephone, sending your provider a message by [facility] may be a faster and more efficient way to get a response.  Please allow 48 business hours for a response.  Please remember that this is for non-urgent requests.  _______________________________________________________

## 2020-12-29 NOTE — Progress Notes (Signed)
12/29/2020 Shannon [name] [id number] Jun 12, 1965   HISTORY OF PRESENT ILLNESS: This is a pleasant 55 year old female who is new to our practice.  She has been referred here by her PCP, Dr. Dennard Schaumann, for evaluation for colonoscopy to be scheduled for colon cancer screening.  She tells me that she was scheduled for colonoscopy in [place] at [facility] in March 2021, but the day before her procedure they called and canceled due to the COVID 19 pandemic.  She has not yet rescheduled until now.  While she is here she tells me that she also has issues with chronic diarrhea.  She says that thinking back it probably began sometime after having her gallbladder removed and has just worsened over the years.  She says that she will have anywhere from 4-6 bowel movements a day.  Occasionally wakes up at nighttime to have a bowel movement, but not often.  Tends to be postprandial, sometimes within 15 minutes of eating.  She has some associated lower abdominal cramping as well that most of the time relieves after having a bowel movement.  She denies seeing blood in her stool.  She reports a lot of urgency and episodes of fecal incontinence.  She has used Imodium on occasion.  She cannot identify any foods in particular that seem to make her symptoms worse.   Past Medical History:  Diagnosis Date   Cancer [facility])    Uterine cancer dx. surgery planned TAH   Hypertension    not taking med x1 yr. couldn't afford.   Multiple thyroid nodules    - FNA 2019, surgery recommends annual US   Ovarian cancer ([place])    PONV (postoperative nausea and vomiting)    Primary hyperparathyroidism ([place])    Uterine cancer ([place])    UTI (lower urinary tract infection)    history of 1 month ago   Past Surgical History:  Procedure Laterality Date   CHOLECYSTECTOMY     laparoscopic   PARATHYROIDECTOMY Right 02/07/2017   Procedure: MINIMALLY INVASIVE PARATHYROIDECTOMY;  Surgeon: Fanny Skates,  MD;  [facility];  Service: General;  Laterality: Right;   ROBOTIC ASSISTED TOTAL HYSTERECTOMY WITH BILATERAL SALPINGO OOPHERECTOMY Bilateral 09/28/2013   Procedure: ROBOTIC ASSISTED TOTAL HYSTERECTOMY WITH BILATERAL SALPINGO OOPHORECTOMY WITH  LYMPH NODE DISECTION, ;  Surgeon: Everitt Amber, MD;  [facility];  Service: Gynecology;  Laterality: Bilateral;    reports that she has never smoked. She has never used smokeless tobacco. She reports that she does not drink alcohol and does not use drugs. family history includes Cancer (age of onset: 2) in her cousin; Cancer (age of onset: 71) in her maternal uncle; Cancer (age of onset: 1) in her maternal uncle, maternal uncle, and paternal aunt; Hyperparathyroidism in her sister. Allergies  Allergen Reactions   Contrast Media [Iodinated Diagnostic Agents] Anaphylaxis and Other (See Comments)    Swelling of the throat, difficulty breathing   Tetanus Toxoids Swelling and Other (See Comments)    Swelling at site, lethargic x few days      Outpatient Encounter Medications as of 12/29/2020  Medication Sig   acetaminophen (TYLENOL) 325 MG tablet Take 650 mg by mouth every 6 (six) hours as needed for moderate pain or headache.   carvedilol (COREG) 6.25 MG tablet TAKE 2 TABLETS (12.5 MG) BY MOUTH 2 TIMES DAILY WITH A MEAL.   cetirizine (ZYRTEC) 10 MG tablet Take 10 mg by mouth daily.   venlafaxine XR (EFFEXOR-XR) 75 MG 24 hr  capsule TAKE 1 CAPSULE BY MOUTH EVERY MORNING WITH BREAKFAST   No facility-administered encounter medications on file as of 12/29/2020.    REVIEW OF SYSTEMS  : All other systems reviewed and negative except where noted in the History of Present Illness.   PHYSICAL EXAM: BP 112/78   Pulse 90   Ht 5\' 2"  (1.575 m)   Wt (!) 332 lb (150.6 kg)   LMP 08/06/2013 Comment: off/on very heavy at times  BMI 60.72 kg/m  General: Well developed white female in no acute distress; of note, patient was unable to get onto the exam table  today. Head: Normocephalic and atraumatic Eyes:  Sclerae anicteric, conjunctiva pink. Ears: Normal auditory acuity Lungs: Clear throughout to auscultation; no W/R/R. Heart: Regular rate and rhythm; no M/R/G. Abdomen: Soft, non-distended.  BS present.  Non-tender. Rectal:  Will be done at the time of colonoscopy. Musculoskeletal: Symmetrical with no gross deformities  Skin: No lesions on visible extremities Extremities: No edema  Neurological: Alert oriented x 4, grossly non-focal Psychological:  Alert and cooperative. Normal mood and affect  ASSESSMENT AND PLAN: *CRC screening: Says that she had a colonoscopy scheduled for March 2021 the day before her procedure they called to cancel it due to the [place] pandemic.  Will schedule this with Dr. Lorenso Courier at [facility] due to patient's BMI greater than 50.   *Chronic diarrhea: Has experienced issues with diarrhea dating back probably to some time after she had her gallbladder removed, but has been worsening over the years.  Tends to be postprandial with a lot of urgency and she has had fecal incontinence.  Suspect component of bile salt related diarrhea.  May be component of IBS as well.  We will plan for colonoscopy as above at which time random biopsies can be performed to rule out microscopic colitis as well.  We discussed using Imodium versus trial of Questran.  She will try the Imodium for now, titrating daily as needed.  **The risks, benefits, and alternatives to colonoscopy were discussed with the patient and she consents to proceed.   CC:  Susy Frizzle, MD

## 2020-12-30 NOTE — Progress Notes (Signed)
I agree with the assessment and plan as outlined by Ms. Zehr. 

## 2021-01-15 ENCOUNTER — Encounter: Payer: Self-pay | Admitting: Nurse Practitioner

## 2021-01-15 ENCOUNTER — Telehealth (INDEPENDENT_AMBULATORY_CARE_PROVIDER_SITE_OTHER): Payer: BC Managed Care – PPO | Admitting: Nurse Practitioner

## 2021-01-15 ENCOUNTER — Other Ambulatory Visit: Payer: Self-pay

## 2021-01-15 VITALS — Temp 101.8°F

## 2021-01-15 DIAGNOSIS — U071 COVID-19: Secondary | ICD-10-CM

## 2021-01-15 DIAGNOSIS — R051 Acute cough: Secondary | ICD-10-CM | POA: Diagnosis not present

## 2021-01-15 MED ORDER — PROMETHAZINE-DM 6.25-15 MG/5ML PO SYRP: 5.0000 mL | ORAL_SOLUTION | Freq: Four times a day (QID) | ORAL | 0 refills | Status: DC | PRN

## 2021-01-15 MED ORDER — MOLNUPIRAVIR EUA 200MG CAPSULE: 4.0000 | ORAL_CAPSULE | Freq: Two times a day (BID) | ORAL | 0 refills | Status: AC

## 2021-01-15 MED ORDER — SALINE SPRAY 0.65 % NA SOLN: 1.0000 | NASAL | 0 refills | Status: DC | PRN

## 2021-01-15 NOTE — Progress Notes (Signed)
Subjective:    Patient ID: Shannon [name], female    DOB: 11/27/1965, 55 y.o.   MRN: [id number]  HPI: Shannon [name] is a 55 y.o. female presenting virtually for COVID-19.  Chief Complaint  Patient presents with   Covid Positive   UPPER RESPIRATORY TRACT INFECTION Onset: yesterday  COVID-19 testing history: tested positive at home today COVID-19 vaccination status: has had 2 vaccines with 1 booster Fever:  yes; TMAX 101.8 Cough: yes Shortness of breath: no Wheezing: no Chest pain: no Chest tightness: no Chest congestion: no Nasal congestion: yes Runny nose: no Post nasal drip: yes Sneezing: no Sore throat: no Swollen glands: no Sinus pressure: yes Headache: yes Face pain: no Toothache: no Ear pain: yes  Ear pressure: no  Eyes red/itching:no Eye drainage/crusting: no  Nausea: no  Vomiting: no Diarrhea: no  Change in appetite: yes; decreased Loss of taste/smell: no  Rash: no Fatigue: yes Sick contacts: no Strep contacts: no  Context: worse Recurrent sinusitis: no Treatments attempted: Mucinex, Tylenol  Relief with OTC medications:   Allergies  Allergen Reactions   Contrast Media [Iodinated Diagnostic Agents] Anaphylaxis and Other (See Comments)    Swelling of the throat, difficulty breathing   Tetanus Toxoids Swelling and Other (See Comments)    Swelling at site, lethargic x few days    Outpatient Encounter Medications as of 01/15/2021  Medication Sig   acetaminophen (TYLENOL) 325 MG tablet Take 650 mg by mouth every 6 (six) hours as needed for moderate pain or headache.   carvedilol (COREG) 6.25 MG tablet TAKE 2 TABLETS (12.5 MG) BY MOUTH 2 TIMES DAILY WITH A MEAL.   cetirizine (ZYRTEC) 10 MG tablet Take 10 mg by mouth daily.   venlafaxine XR (EFFEXOR-XR) 75 MG 24 hr capsule TAKE 1 CAPSULE BY MOUTH EVERY MORNING WITH BREAKFAST   molnupiravir EUA (LAGEVRIO) 200 mg CAPS capsule Take 4 capsules (800 mg total) by mouth 2 (two) times  daily for 5 days.   promethazine-dextromethorphan (PROMETHAZINE-DM) 6.25-15 MG/5ML syrup Take 5 mLs by mouth 4 (four) times daily as needed for cough.   sodium chloride (OCEAN) 0.65 % SOLN nasal spray Place 1 spray into both nostrils as needed for congestion.   No facility-administered encounter medications on file as of 01/15/2021.    Patient Active Problem List   Diagnosis Date Noted   Chronic diarrhea 12/29/2020   Colon cancer screening 12/29/2020   Multiple thyroid nodules    Primary hyperparathyroidism ([place])    Hyperparathyroidism, primary ([place]) 09/19/2016   Hypercalcemia 06/26/2016   Vaginal candida 01/13/2015   Elevated fasting blood sugar 07/26/2014   Essential hypertension 07/26/2014   GAD (generalized anxiety disorder) 07/26/2014   Endometrial adenocarcinoma ([place]) 02/14/2014   Ovarian ca ([place]) 02/14/2014   Endometrial ca ([place]) 02/14/2014   Premature surgical menopause 10/25/2013   Anemia, iron deficiency 10/08/2013   Morbid obesity ([place]) 09/02/2013    Past Medical History:  Diagnosis Date   Cancer ([place])    Uterine cancer dx. surgery planned TAH   Hypertension    not taking med x1 yr. couldn't afford.   Multiple thyroid nodules    - FNA 2019, surgery recommends annual US   Ovarian cancer ([place])    PONV (postoperative nausea and vomiting)    Primary hyperparathyroidism ([place])    Uterine cancer ([place])    UTI (lower urinary tract infection)    history of 1 month ago    Relevant past medical, surgical, family and social history reviewed and  updated as indicated. Interim medical history since our last visit reviewed.  Review of Systems Per HPI unless specifically indicated above     Objective:    Temp (!) 101.8 F (38.8 C)    LMP 08/06/2013 Comment: off/on very heavy at times  Wt Readings from Last 3 Encounters:  12/29/20 (!) 332 lb (150.6 kg)  09/11/20 (!) 336 lb (152.4 kg)  08/24/19 (!) 327 lb (148.3 kg)    Physical Exam Physical examination unable to be  performed due to lack of equipment.  Patient talking in complete sentences during telemedicine visit.    Assessment & Plan:  1. COVID-19 Acute.  Discussed symptomatic treatment.  Also discussed isolation guidelines.  Start molnupiravir given risk factors for severe disease.  With any sudden onset new chest pain, dizziness, sweating, or shortness of breath, go to ED.  - molnupiravir EUA (LAGEVRIO) 200 mg CAPS capsule; Take 4 capsules (800 mg total) by mouth 2 (two) times daily for 5 days.  Dispense: 40 capsule; Refill: 0  2. Acute cough Acute.  Suspect related to post nasal drainage.  Start nasal saline to help with congestion.  Discussed humidifier, nasal rinses, steam showers.  Can use cough syrup at night for dry cough.  Follow up if symptoms persist or worsen.   - sodium chloride (OCEAN) 0.65 % SOLN nasal spray; Place 1 spray into both nostrils as needed for congestion.  Dispense: 88 mL; Refill: 0 - promethazine-dextromethorphan (PROMETHAZINE-DM) 6.25-15 MG/5ML syrup; Take 5 mLs by mouth 4 (four) times daily as needed for cough.  Dispense: 118 mL; Refill: 0    Follow up plan: Return if symptoms worsen or fail to improve.   This visit was completed via telephone due to the restrictions of the COVID-19 pandemic. All issues as above were discussed and addressed but no physical exam was performed. If it was felt that the patient should be evaluated in the office, they were directed there. The patient verbally consented to this visit. Patient was unable to complete an audio/visual visit due to Technical difficulties.  Patient able to connect to video visit, however I was unable to visualize or hear the patient and patient reported her microphone was not working.  Visit was then transitioned to a telephone visit. Location of the patient: home Location of the provider: work Those involved with this call:  Provider: Noemi Chapel, DNP, FNP-C CMA: Elizabeth Palau, CMA Front Desk/Registration:  Santina Evans  Time spent on call:  9 minutes on the phone discussing health concerns. 15 minutes total spent in review of patient's record and preparation of their chart. I verified patient identity using two factors (patient name and date of birth). Patient consents verbally to being seen via telemedicine visit today.

## 2021-02-27 ENCOUNTER — Encounter (HOSPITAL_COMMUNITY): Payer: Self-pay | Admitting: Internal Medicine

## 2021-03-06 ENCOUNTER — Other Ambulatory Visit: Payer: Self-pay | Admitting: Family Medicine

## 2021-03-07 NOTE — Anesthesia Preprocedure Evaluation (Addendum)
Anesthesia Evaluation  Patient identified by MRN, date of birth, ID band Patient awake    Reviewed: Allergy & Precautions, NPO status , Patient's Chart, lab work & pertinent test results, reviewed documented beta blocker date and time   History of Anesthesia Complications (+) PONV and history of anesthetic complications  Airway Mallampati: II  TM Distance: >3 FB Neck ROM: Full    Dental no notable dental hx.    Pulmonary neg pulmonary ROS,    Pulmonary exam normal        Cardiovascular hypertension, Pt. on medications and Pt. on home beta blockers Normal cardiovascular exam     Neuro/Psych Anxiety negative neurological ROS     GI/Hepatic negative GI ROS, Neg liver ROS,   Endo/Other  Morbid obesity (BMI 60)  Renal/GU negative Renal ROS  negative genitourinary   Musculoskeletal negative musculoskeletal ROS (+)   Abdominal   Peds  Hematology negative hematology ROS (+)   Anesthesia Other Findings Day of surgery medications reviewed with patient.  Reproductive/Obstetrics negative OB ROS                            Anesthesia Physical Anesthesia Plan  ASA: 3  Anesthesia Plan: MAC   Post-op Pain Management: Minimal or no pain anticipated   Induction:   PONV Risk Score and Plan: 3 and Treatment may vary due to age or medical condition and Propofol infusion  Airway Management Planned: Natural Airway and Nasal Cannula  Additional Equipment: None  Intra-op Plan:   Post-operative Plan:   Informed Consent: I have reviewed the patients History and Physical, chart, labs and discussed the procedure including the risks, benefits and alternatives for the proposed anesthesia with the patient or authorized representative who has indicated his/her understanding and acceptance.       Plan Discussed with: CRNA  Anesthesia Plan Comments:        Anesthesia Quick Evaluation

## 2021-03-08 ENCOUNTER — Ambulatory Visit (HOSPITAL_COMMUNITY): Payer: BC Managed Care – PPO | Admitting: Certified Registered"

## 2021-03-08 ENCOUNTER — Encounter (HOSPITAL_COMMUNITY)
Admission: RE | Disposition: A | Discharge status: Home / Self Care | Payer: Self-pay | Source: Ambulatory Visit | Attending: Internal Medicine

## 2021-03-08 ENCOUNTER — Other Ambulatory Visit: Payer: Self-pay

## 2021-03-08 ENCOUNTER — Ambulatory Visit (HOSPITAL_COMMUNITY)
Admission: RE | Admit: 2021-03-08 | Discharge: 2021-03-08 | Disposition: A | Discharge status: Home / Self Care | Payer: BC Managed Care – PPO | Source: Ambulatory Visit | Attending: Internal Medicine | Admitting: Internal Medicine

## 2021-03-08 ENCOUNTER — Encounter (HOSPITAL_COMMUNITY): Payer: Self-pay | Admitting: Internal Medicine

## 2021-03-08 DIAGNOSIS — Z6841 Body Mass Index (BMI) 40.0 and over, adult: Secondary | ICD-10-CM | POA: Diagnosis not present

## 2021-03-08 DIAGNOSIS — K529 Noninfective gastroenteritis and colitis, unspecified: Secondary | ICD-10-CM | POA: Insufficient documentation

## 2021-03-08 DIAGNOSIS — K635 Polyp of colon: Secondary | ICD-10-CM

## 2021-03-08 DIAGNOSIS — I10 Essential (primary) hypertension: Secondary | ICD-10-CM | POA: Diagnosis not present

## 2021-03-08 DIAGNOSIS — Z1211 Encounter for screening for malignant neoplasm of colon: Secondary | ICD-10-CM | POA: Diagnosis not present

## 2021-03-08 DIAGNOSIS — K648 Other hemorrhoids: Secondary | ICD-10-CM | POA: Insufficient documentation

## 2021-03-08 HISTORY — PX: POLYPECTOMY: SHX5525

## 2021-03-08 HISTORY — PX: COLONOSCOPY WITH PROPOFOL: SHX5780

## 2021-03-08 HISTORY — PX: BIOPSY: SHX5522

## 2021-03-08 SURGERY — COLONOSCOPY WITH PROPOFOL
Anesthesia: Monitor Anesthesia Care

## 2021-03-08 MED ORDER — PROPOFOL 500 MG/50ML IV EMUL
INTRAVENOUS | Status: DC | PRN
  Administered 2021-03-08: 125 ug/kg/min via INTRAVENOUS

## 2021-03-08 MED ORDER — GLYCOPYRROLATE 0.2 MG/ML IJ SOLN
INTRAMUSCULAR | Status: DC | PRN
  Administered 2021-03-08 (×2): .1 mg via INTRAVENOUS

## 2021-03-08 MED ORDER — PROPOFOL 10 MG/ML IV BOLUS
INTRAVENOUS | Status: DC | PRN
  Administered 2021-03-08: 20 mg via INTRAVENOUS

## 2021-03-08 MED ORDER — LACTATED RINGERS IV SOLN: INTRAVENOUS | Status: DC

## 2021-03-08 MED ORDER — PROPOFOL 1000 MG/100ML IV EMUL
INTRAVENOUS | Status: AC
  Filled 2021-03-08: qty 100

## 2021-03-08 MED ORDER — PROPOFOL 10 MG/ML IV BOLUS
INTRAVENOUS | Status: AC
  Filled 2021-03-08: qty 20

## 2021-03-08 MED ORDER — LIDOCAINE 2% (20 MG/ML) 5 ML SYRINGE
INTRAMUSCULAR | Status: DC | PRN
Start: 2021-03-08 — End: 2021-03-08
  Administered 2021-03-08: 40 mg via INTRAVENOUS

## 2021-03-08 SURGICAL SUPPLY — 22 items

## 2021-03-08 NOTE — Anesthesia Procedure Notes (Signed)
Procedure Name: MAC Date/Time: 03/08/2021 10:06 AM Performed by: Eben Burow, CRNA Pre-anesthesia Checklist: Patient identified, Emergency Drugs available, Suction available, Patient being monitored and Timeout performed Oxygen Delivery Method: Supernova nasal CPAP Placement Confirmation: positive ETCO2

## 2021-03-08 NOTE — Transfer of Care (Signed)
Immediate Anesthesia Transfer of Care Note  Patient: Shannon [name]  Procedure(s) Performed: COLONOSCOPY WITH PROPOFOL BIOPSY POLYPECTOMY  Patient Location: PACU and Endoscopy Unit  Anesthesia Type:MAC  Level of Consciousness: awake, alert  and patient cooperative  Airway & Oxygen Therapy: Patient Spontanous Breathing  Post-op Assessment: Report given to RN and Post -op Vital signs reviewed and stable  Post vital signs: Reviewed and stable  Last Vitals:  Vitals Value Taken Time  BP 129/78 03/08/21 1050  Temp 36.6 C 03/08/21 1050  Pulse 91 03/08/21 1051  Resp 32 03/08/21 1051  SpO2 97 % 03/08/21 1051  Vitals shown include unvalidated device data.  Last Pain:  Vitals:   03/08/21 1050  TempSrc: Tympanic  PainSc: Asleep         Complications: No notable events documented.

## 2021-03-08 NOTE — Discharge Instructions (Signed)
YOU HAD AN ENDOSCOPIC PROCEDURE TODAY: Refer to the procedure report and other information in the discharge instructions given to you for any specific questions about what was found during the examination. If this information does not answer your questions, please call [facility] office at [phone] to clarify.  ° °YOU SHOULD EXPECT: Some feelings of bloating in the abdomen. Passage of more gas than usual. Walking can help get rid of the air that was put into your GI tract during the procedure and reduce the bloating. If you had a lower endoscopy (such as a colonoscopy or flexible sigmoidoscopy) you may notice spotting of blood in your stool or on the toilet paper. Some abdominal soreness may be present for a day or two, also. ° °DIET: Your first meal following the procedure should be a light meal and then it is ok to progress to your normal diet. A half-sandwich or bowl of soup is an example of a good first meal. Heavy or fried foods are harder to digest and may make you feel nauseous or bloated. Drink plenty of fluids but you should avoid alcoholic beverages for 24 hours. If you had a esophageal dilation, please see attached instructions for diet.   ° °ACTIVITY: Your care partner should take you home directly after the procedure. You should plan to take it easy, moving slowly for the rest of the day. You can resume normal activity the day after the procedure however YOU SHOULD NOT DRIVE, use power tools, machinery or perform tasks that involve climbing or major physical exertion for 24 hours (because of the sedation medicines used during the test).  ° °SYMPTOMS TO REPORT IMMEDIATELY: °A gastroenterologist can be reached at any hour. Please call [phone]  for any of the following symptoms:  °Following lower endoscopy (colonoscopy, flexible sigmoidoscopy) °Excessive amounts of blood in the stool  °Significant tenderness, worsening of abdominal pains  °Swelling of the abdomen that is new, acute  °Fever of 100° or  higher  °Following upper endoscopy (EGD, EUS, ERCP, esophageal dilation) °Vomiting of blood or coffee ground material  °New, significant abdominal pain  °New, significant chest pain or pain under the shoulder blades  °Painful or persistently difficult swallowing  °New shortness of breath  °Black, tarry-looking or red, bloody stools ° °FOLLOW UP:  °If any biopsies were taken you will be contacted by phone or by letter within the next 1-3 weeks. Call [phone]  if you have not heard about the biopsies in 3 weeks.  °Please also call with any specific questions about appointments or follow up tests. ° °

## 2021-03-08 NOTE — Addendum Note (Signed)
Addendum  created 03/08/21 1240 by Eben Burow, CRNA   Charge Capture section accepted

## 2021-03-08 NOTE — Op Note (Signed)
[facility] Patient Name: Shannon [name] Procedure Date: 03/08/2021 MRN: [id number] Attending MD: Georgian Co ,  Date of Birth: [birth year] CSN: [id number] Age: 56 Admit Type: Outpatient Procedure:                Colonoscopy Indications:              Chronic diarrhea Providers:                Adline Mango" Estill Bakes, Technician Referring MD:              Medicines:                Monitored Anesthesia Care Complications:            No immediate complications. Estimated Blood Loss:     Estimated blood loss was minimal. Procedure:                Pre-Anesthesia Assessment:                           - Prior to the procedure, a History and Physical                            was performed, and patient medications and                            allergies were reviewed. The patient's tolerance of                            previous anesthesia was also reviewed. The risks                            and benefits of the procedure and the sedation                            options and risks were discussed with the patient.                            All questions were answered, and informed consent                            was obtained. Prior Anticoagulants: The patient has                            taken no previous anticoagulant or antiplatelet                            agents. ASA Grade Assessment: III - A patient with                            severe systemic disease. After reviewing the risks                            and benefits,  the patient was deemed in                            satisfactory condition to undergo the procedure.                           After obtaining informed consent, the colonoscope                            was passed under direct vision. Throughout the                            procedure, the patient's blood pressure, pulse, and                            oxygen saturations  were monitored continuously. The                            CF-HQ190L ([id number]) Olympus colonoscope was                            introduced through the anus and advanced to the the                            terminal ileum. The colonoscopy was performed                            without difficulty. The patient tolerated the                            procedure well. The quality of the bowel                            preparation was good. The terminal ileum, ileocecal                            valve, appendiceal orifice, and rectum were                            photographed. Scope In: 10:16:26 AM Scope Out: 10:39:40 AM Scope Withdrawal Time: 0 hours 18 minutes 26 seconds  Total Procedure Duration: 0 hours 23 minutes 14 seconds  Findings:      The terminal ileum appeared normal.      Biopsies for histology were taken with a cold forceps from the right       colon, left colon and transverse colon for evaluation of microscopic       colitis.      Two sessile polyps were found in the sigmoid colon. The polyps were 3 to       5 mm in size. These polyps were removed with a cold snare. Resection and       retrieval were complete.      Internal hemorrhoids were found during retroflexion. Impression:               - The examined portion of the ileum was normal.                           -  Biopsies were taken with a cold forceps from the                            right colon, left colon and transverse colon for                            evaluation of microscopic colitis.                           - Two 3 to 5 mm polyps in the sigmoid colon,                            removed with a cold snare. Resected and retrieved.                           - Internal hemorrhoids. Moderate Sedation:      Not Applicable - Patient had care per Anesthesia. Recommendation:           - Discharge patient to home (with escort).                           - Await pathology results.                           -  The findings and recommendations were discussed                            with the patient. Procedure Code(s):        --- Professional ---                           [id number], Colonoscopy, flexible; with removal of                            tumor(s), polyp(s), or other lesion(s) by snare                            technique                           45380, 72, Colonoscopy, flexible; with biopsy,                            single or multiple Diagnosis Code(s):        --- Professional ---                           K64.8, Other hemorrhoids                           K63.5, Polyp of colon                           K52.9, Noninfective gastroenteritis and colitis,  unspecified CPT copyright 2019 American Medical Association. All rights reserved. The codes documented in this report are preliminary and upon coder review may  be revised to meet current compliance requirements. [name] "[name],  03/08/2021 10:51:11 AM Number of Addenda: 0

## 2021-03-08 NOTE — Anesthesia Postprocedure Evaluation (Signed)
Anesthesia Post Note  Patient: Shannon [name]  Procedure(s) Performed: COLONOSCOPY WITH PROPOFOL BIOPSY POLYPECTOMY     Patient location during evaluation: PACU Anesthesia Type: MAC Level of consciousness: awake and alert Pain management: pain level controlled Vital Signs Assessment: post-procedure vital signs reviewed and stable Respiratory status: spontaneous breathing, nonlabored ventilation and respiratory function stable Cardiovascular status: blood pressure returned to baseline Postop Assessment: no apparent nausea or vomiting Anesthetic complications: no   No notable events documented.  Last Vitals:  Vitals:   03/08/21 1100 03/08/21 1110  BP: (!) 147/99 (!) 155/98  Pulse: 83 73  Resp: 15 12  Temp:    SpO2: 95% 98%    Last Pain:  Vitals:   03/08/21 1110  TempSrc:   PainSc: 0-No pain                 Marthenia Rolling

## 2021-03-08 NOTE — H&P (Signed)
GASTROENTEROLOGY PROCEDURE H&P NOTE   Primary Care Physician: Susy Frizzle, MD    Reason for Procedure:   Colon cancer screening, diarrhea  Plan:    Colonoscopy  Patient is appropriate for endoscopic procedure(s) in the ambulatory ([place]) setting.  The nature of the procedure, as well as the risks, benefits, and alternatives were carefully and thoroughly reviewed with the patient. Ample time for discussion and questions allowed. The patient understood, was satisfied, and agreed to proceed.     HPI: Shannon [name] is a 56 y.o. female who presents for colonoscopy for colon cancer screening and diarrhea.  Past Medical History:  Diagnosis Date   Cancer [facility])    Uterine cancer dx. surgery planned TAH   Hypertension    not taking med x1 yr. couldn't afford.   Multiple thyroid nodules    - FNA 2019, surgery recommends annual US   Ovarian cancer ([place])    PONV (postoperative nausea and vomiting)    Primary hyperparathyroidism ([place])    Uterine cancer ([place])    UTI (lower urinary tract infection)    history of 1 month ago    Past Surgical History:  Procedure Laterality Date   CHOLECYSTECTOMY     laparoscopic   PARATHYROIDECTOMY Right 02/07/2017   Procedure: MINIMALLY INVASIVE PARATHYROIDECTOMY;  Surgeon: Fanny Skates, MD;  Location: [facility];  Service: General;  Laterality: Right;   ROBOTIC ASSISTED TOTAL HYSTERECTOMY WITH BILATERAL SALPINGO OOPHERECTOMY Bilateral 09/28/2013   Procedure: ROBOTIC ASSISTED TOTAL HYSTERECTOMY WITH BILATERAL SALPINGO OOPHORECTOMY WITH  LYMPH NODE DISECTION, ;  Surgeon: Everitt Amber, MD;  Location: [facility];  Service: Gynecology;  Laterality: Bilateral;    Prior to Admission medications   Medication Sig Start Date End Date Taking? Authorizing Provider  acetaminophen (TYLENOL) 650 MG CR tablet Take 1,300 mg by mouth every 8 (eight) hours as needed for pain.   Yes [provider]  Carboxymethylcellul-Glycerin (LUBRICATING EYE DROPS  OP) Place 1 drop into both eyes daily as needed (dry eyes).   Yes [provider]  carvedilol (COREG) 6.25 MG tablet TAKE 2 TABLETS (12.5 MG) BY MOUTH 2 TIMES DAILY WITH A MEAL. 03/06/21  Yes Susy Frizzle, MD  cetirizine (ZYRTEC) 10 MG tablet Take 10 mg by mouth daily.   Yes [provider]  loperamide (IMODIUM A-D) 2 MG tablet Take 1 mg by mouth daily.   Yes [provider]  Menthol-Methyl Salicylate (SALONPAS PAIN RELIEF PATCH EX) Apply 1 patch topically daily as needed (pain).   Yes [provider]  venlafaxine XR (EFFEXOR-XR) 75 MG 24 hr capsule TAKE 1 CAPSULE BY MOUTH EVERY MORNING WITH BREAKFAST 09/13/20  Yes Susy Frizzle, MD  promethazine-dextromethorphan (PROMETHAZINE-DM) 6.25-15 MG/5ML syrup Take 5 mLs by mouth 4 (four) times daily as needed for cough. Patient not taking: Reported on 03/01/2021 01/15/21   Eulogio Bear, NP  sodium chloride (OCEAN) 0.65 % SOLN nasal spray Place 1 spray into both nostrils as needed for congestion. Patient not taking: Reported on 03/01/2021 01/15/21   Eulogio Bear, NP    Current Facility-Administered Medications  Medication Dose Route Frequency Provider Last Rate Last Admin   lactated ringers infusion   Intravenous Continuous Sharyn Creamer, MD 10 mL/hr at 03/08/21 5631 New Bag at 03/08/21 0828    Allergies as of 12/29/2020 - Review Complete 12/29/2020  Allergen Reaction Noted   Contrast media [iodinated contrast media] Anaphylaxis and Other (See Comments) 10/09/2013   Tetanus toxoids Swelling and Other (See Comments)  09/02/2013    Family History  Problem Relation Age of Onset   Cancer Maternal Uncle 35       unknown type of cancer   Cancer Paternal Aunt 75       bone cancer   Cancer Maternal Uncle 75       colorectal cancer   Cancer Maternal Uncle 75       throat cancer, smoker   Cancer Cousin 70       mat first cousin with throat cancer    Hyperparathyroidism Sister     Social History    Socioeconomic History   Marital status: Single    Spouse name: Not on file   Number of children: Not on file   Years of education: Not on file   Highest education level: Not on file  Occupational History   Not on file  Tobacco Use   Smoking status: Never   Smokeless tobacco: Never  Vaping Use   Vaping Use: Never used  Substance and Sexual Activity   Alcohol use: No   Drug use: No   Sexual activity: Not Currently  Other Topics Concern   Not on file  Social History Narrative   Not on file   Social Determinants of Health   Financial Resource Strain: Not on file  Food Insecurity: Not on file  Transportation Needs: Not on file  Physical Activity: Not on file  Stress: Not on file  Social Connections: Not on file  Intimate Partner Violence: Not on file    Physical Exam: Vital signs in last 24 hours: BP (!) 110/59    Pulse 87    Temp 97.9 F (36.6 C) (Temporal)    Resp 17    Ht 5\' 2"  (1.575 m)    Wt (!) 151.5 kg    LMP 08/06/2013 Comment: off/on very heavy at times   SpO2 96%    BMI 61.09 kg/m  GEN: NAD EYE: Sclerae anicteric ENT: MMM CV: Non-tachycardic Pulm: No increased work of breathing GI: Soft, NT/ND NEURO:  Alert & Oriented   Shannon Reading, MD [facility]  03/08/2021 9:47 AM

## 2021-03-09 ENCOUNTER — Encounter (HOSPITAL_COMMUNITY): Payer: Self-pay | Admitting: Internal Medicine

## 2021-03-09 ENCOUNTER — Encounter: Payer: Self-pay | Admitting: Internal Medicine

## 2021-03-09 LAB — SURGICAL PATHOLOGY

## 2021-04-04 ENCOUNTER — Other Ambulatory Visit: Payer: Self-pay | Admitting: Family Medicine

## 2021-05-10 ENCOUNTER — Other Ambulatory Visit: Payer: Self-pay | Admitting: Family Medicine

## 2021-05-10 DIAGNOSIS — Z1231 Encounter for screening mammogram for malignant neoplasm of breast: Secondary | ICD-10-CM

## 2021-06-08 ENCOUNTER — Ambulatory Visit
Admission: RE | Admit: 2021-06-08 | Discharge: 2021-06-08 | Disposition: A | Discharge status: Home / Self Care | Payer: BC Managed Care – PPO | Source: Ambulatory Visit | Attending: Family Medicine | Admitting: Family Medicine

## 2021-06-08 DIAGNOSIS — Z1231 Encounter for screening mammogram for malignant neoplasm of breast: Secondary | ICD-10-CM

## 2021-07-12 ENCOUNTER — Other Ambulatory Visit: Payer: Self-pay | Admitting: Family Medicine

## 2021-08-09 ENCOUNTER — Other Ambulatory Visit: Payer: Self-pay | Admitting: Family Medicine

## 2021-08-09 NOTE — Telephone Encounter (Signed)
Requested medications are due for refill today.  yes  Requested medications are on the active medications list.  yes  Last refill. 07/12/2021 #120 1 refill  Future visit scheduled.   no  Notes to clinic.  Labs are expired.    Requested Prescriptions  Pending Prescriptions Disp Refills   carvedilol (COREG) 6.25 MG tablet [Pharmacy Med Name: CARVEDILOL 6.25 MG TABLET] 120 tablet 1    Sig: TAKE 2 TABLETS (12.5 MG) BY MOUTH 2 TIMES DAILY WITH A MEAL.     Cardiovascular: Beta Blockers 3 Failed - 08/09/2021  2:34 PM      Failed - Cr in normal range and within 360 days    Creatinine  Date Value Ref Range Status  10/08/2013 0.7 0.6 - 1.1 mg/dL Final   Creat  Date Value Ref Range Status  08/24/2019 0.76 0.50 - 1.05 mg/dL Final    Comment:    For patients >64 years of age, the reference limit for Creatinine is approximately 13% higher for people identified as African-American. .          Failed - AST in normal range and within 360 days    AST  Date Value Ref Range Status  08/24/2019 14 10 - 35 U/L Final  10/08/2013 26 5 - 34 U/L Final         Failed - ALT in normal range and within 360 days    ALT  Date Value Ref Range Status  08/24/2019 11 6 - 29 U/L Final  10/08/2013 24 0 - 55 U/L Final         Failed - Last BP in normal range    BP Readings from Last 1 Encounters:  03/08/21 (!) 155/98         Failed - Valid encounter within last 6 months    Recent Outpatient Visits           6 months ago [place] Eulogio Bear, NP   11 months ago Multinodular goiter   [place] Dennard Schaumann, Cammie Mcgee, MD   1 year ago Primary hyperparathyroidism [facility])   [place] Susy Frizzle, MD   3 years ago Acute non-recurrent maxillary sinusitis   [place], [place] Nunnery, MD   4 years ago Primary hyperparathyroidism [facility])   [place] Susy Frizzle, MD               Passed - Last Heart Rate in normal range    Pulse Readings from Last 1 Encounters:  03/08/21 73

## 2021-08-15 ENCOUNTER — Telehealth: Payer: Self-pay

## 2021-08-15 NOTE — Telephone Encounter (Signed)
RF on Carvedilol x 1. Needs appointment prior to next refill. Thank you!

## 2021-10-12 ENCOUNTER — Other Ambulatory Visit: Payer: Self-pay

## 2021-10-12 NOTE — Telephone Encounter (Signed)
Requested medications are due for refill today.  yes  Requested medications are on the active medications list.  yes  Last refill. 09/13/2020 #90 3 refills  Future visit scheduled.   No  Notes to clinic.  Pt is more than 3 months overdue for OV    Requested Prescriptions  Pending Prescriptions Disp Refills   venlafaxine XR (EFFEXOR-XR) 75 MG 24 hr capsule 90 capsule 3    Sig: TAKE 1 CAPSULE BY MOUTH EVERY MORNING WITH BREAKFAST     Psychiatry: Antidepressants - SNRI - desvenlafaxine & venlafaxine Failed - 10/12/2021 11:37 AM      Failed - Cr in normal range and within 360 days    Creatinine  Date Value Ref Range Status  10/08/2013 0.7 0.6 - 1.1 mg/dL Final   Creat  Date Value Ref Range Status  08/24/2019 0.76 0.50 - 1.05 mg/dL Final    Comment:    For patients >40 years of age, the reference limit for Creatinine is approximately 13% higher for people identified as African-American. .          Failed - Last BP in normal range    BP Readings from Last 1 Encounters:  03/08/21 (!) 155/98         Failed - Valid encounter within last 6 months    Recent Outpatient Visits           9 months ago [place] Eulogio Bear, NP   1 year ago Multinodular goiter   [place] Dennard Schaumann, Cammie Mcgee, MD   2 years ago Primary hyperparathyroidism [facility])   [place] Susy Frizzle, MD   3 years ago Acute non-recurrent maxillary sinusitis   [place], [place] Nunnery, MD   4 years ago Primary hyperparathyroidism [facility])   [place] Susy Frizzle, MD              Failed - Lipid Panel in normal range within the last 12 months    Cholesterol  Date Value Ref Range Status  08/30/2020 153 <200 mg/dL Final   LDL Cholesterol (Calc)  Date Value Ref Range Status  08/30/2020 75 mg/dL (calc) Final    Comment:    Reference range: <100 . Desirable range <100 mg/dL for  primary prevention;   <70 mg/dL for patients with CHD or diabetic patients  with > or = 2 CHD risk factors. Marland Kitchen LDL-C is now calculated using the Martin-Hopkins  calculation, which is a validated novel method providing  better accuracy than the Friedewald equation in the  estimation of LDL-C.  Cresenciano Genre et al. Annamaria Helling. 8295;621(30): 2061-2068  (http://education.[facility].com/faq/FAQ164)    HDL  Date Value Ref Range Status  08/30/2020 60 > OR = 50 mg/dL Final   Triglycerides  Date Value Ref Range Status  08/30/2020 97 <150 mg/dL Final

## 2021-10-12 NOTE — Telephone Encounter (Signed)
Pharmacy faxed a refill request for venlafaxine XR (EFFEXOR-XR) 75 MG 24 hr capsule [[id number]]    Order Details Dose, Route, Frequency: As Directed  Dispense Quantity: 90 capsule Refills: 3        Sig: TAKE 1 CAPSULE BY MOUTH EVERY MORNING WITH BREAKFAST       Start Date: 09/13/20 End Date: --  Written Date: 09/13/20 Expiration Date: 09/13/21  Original Order:  venlafaxine XR (EFFEXOR-XR) 75 MG 24 hr capsule [[id number]]

## 2021-10-16 ENCOUNTER — Other Ambulatory Visit: Payer: Self-pay | Admitting: Family Medicine

## 2021-10-16 ENCOUNTER — Telehealth: Payer: Self-pay

## 2021-10-16 NOTE — Telephone Encounter (Signed)
Called pt to schedule cpe and labs before future refills. Pt not at home left messg for pt to return call to office to set up an appt.

## 2021-10-16 NOTE — Telephone Encounter (Signed)
Left message with family member to request call back for scheduling.

## 2021-10-16 NOTE — Telephone Encounter (Signed)
Received eFax from pharmacy to request refill of  venlafaxine XR (EFFEXOR-XR) 75 MG 24 hr capsule [[id number]]   Pharmacy:  [id number] [place] [place] [address] [id number] Phone:  [phone] Fax:  [phone] DEA #:  [id number] LOV: 09/11/20 (had virtual visit 01/15/21)  Patient called to follow up on refill request; office visit needed. Will call patient to schedule  Please advise pharmacist at [phone]

## 2021-10-17 NOTE — Telephone Encounter (Signed)
Requested medication (s) are due for refill today: yes  Requested medication (s) are on the active medication list: yes  Last refill:  09/13/20 #90 3 RF  Future visit scheduled: no  Notes to clinic:  called pt and LM on VM to call back to make appt for refills   Requested Prescriptions  Pending Prescriptions Disp Refills   venlafaxine XR (EFFEXOR-XR) 75 MG 24 hr capsule 90 capsule 3    Sig: TAKE 1 CAPSULE BY MOUTH EVERY MORNING WITH BREAKFAST     Psychiatry: Antidepressants - SNRI - desvenlafaxine & venlafaxine Failed - 10/17/2021 11:24 AM      Failed - Cr in normal range and within 360 days    Creatinine  Date Value Ref Range Status  10/08/2013 0.7 0.6 - 1.1 mg/dL Final   Creat  Date Value Ref Range Status  08/24/2019 0.76 0.50 - 1.05 mg/dL Final    Comment:    For patients >11 years of age, the reference limit for Creatinine is approximately 13% higher for people identified as African-American. .          Failed - Last BP in normal range    BP Readings from Last 1 Encounters:  03/08/21 (!) 155/98         Failed - Valid encounter within last 6 months    Recent Outpatient Visits           9 months ago [place] Eulogio Bear, NP   1 year ago Multinodular goiter   [place] Dennard Schaumann, Cammie Mcgee, MD   2 years ago Primary hyperparathyroidism [facility])   [place] Susy Frizzle, MD   3 years ago Acute non-recurrent maxillary sinusitis   [place], [place] Nunnery, MD   4 years ago Primary hyperparathyroidism [facility])   [place] Susy Frizzle, MD              Failed - Lipid Panel in normal range within the last 12 months    Cholesterol  Date Value Ref Range Status  08/30/2020 153 <200 mg/dL Final   LDL Cholesterol (Calc)  Date Value Ref Range Status  08/30/2020 75 mg/dL (calc) Final    Comment:    Reference range: <100 . Desirable range <100  mg/dL for primary prevention;   <70 mg/dL for patients with CHD or diabetic patients  with > or = 2 CHD risk factors. Marland Kitchen LDL-C is now calculated using the Martin-Hopkins  calculation, which is a validated novel method providing  better accuracy than the Friedewald equation in the  estimation of LDL-C.  Cresenciano Genre et al. Annamaria Helling. 4782;956(21): 2061-2068  (http://education.[facility].com/faq/FAQ164)    HDL  Date Value Ref Range Status  08/30/2020 60 > OR = 50 mg/dL Final   Triglycerides  Date Value Ref Range Status  08/30/2020 97 <150 mg/dL Final

## 2021-10-18 NOTE — Telephone Encounter (Signed)
Pt called back. Tried to return call and had to leave VM. Also sent my chart message for pt to call office to schedule CPE and labs.

## 2021-10-22 ENCOUNTER — Ambulatory Visit: Payer: BC Managed Care – PPO | Admitting: Family Medicine

## 2021-10-22 VITALS — BP 146/90 | HR 69 | Temp 98.2°F | Ht 62.0 in | Wt 326.4 lb

## 2021-10-22 DIAGNOSIS — I10 Essential (primary) hypertension: Secondary | ICD-10-CM

## 2021-10-22 DIAGNOSIS — Z6841 Body Mass Index (BMI) 40.0 and over, adult: Secondary | ICD-10-CM

## 2021-10-22 DIAGNOSIS — E042 Nontoxic multinodular goiter: Secondary | ICD-10-CM

## 2021-10-22 DIAGNOSIS — Z23 Encounter for immunization: Secondary | ICD-10-CM

## 2021-10-22 MED ORDER — WEGOVY 0.5 MG/0.5ML ~~LOC~~ SOAJ: 0.5000 mg | SUBCUTANEOUS | 1 refills | Status: DC

## 2021-10-22 MED ORDER — NEBIVOLOL HCL 10 MG PO TABS: 10.0000 mg | ORAL_TABLET | Freq: Every day | ORAL | 3 refills | Status: DC

## 2021-10-22 NOTE — Addendum Note (Signed)
Addended by: Randal Buba K on: 10/22/2021 02:48 PM   Modules accepted: Orders

## 2021-10-22 NOTE — Progress Notes (Signed)
Subjective:    Patient ID: Shannon [name], female    DOB: Feb 26, 1965, 56 y.o.   MRN: [id number]  HPI 09/11/20 Patient is a very pleasant 56 year old Caucasian female who presents today for complete physical exam.  Past medical history significant for hyperparathyroidism.  She underwent a parathyroidectomy due to hyperparathyroidism and elevated serum calcium levels.  She is due today to recheck calcium.  She was also found to have a 2 cm right thyroid nodule but fine-needle aspiration was negative for any malignancy.  The plan is to follow this annually with thyroid ultrasounds.  Last thyroid ultrasound was performed last year.  She is due to repeat that today.  Mammogram is not due again until December.  She is still not yet had a colonoscopy.  Most recent lab work is listed below.  Blood pressure today is outstanding at 128/64.  My biggest concern based on her physical exam is a BMI of 61.  At that time, my plan was:  Patient will have an ultrasound of the thyroid to monitor the nodules.  I will schedule her for a colonoscopy for colon cancer screening.  Blood pressures acceptable.  Lab work is outstanding.  GYN performs pelvic exam.  Strongly recommended diet exercise and weight loss.  Also discussed GLP-1 agonist as a means to achieve weight loss.  10/22/21 Patient is a very sweet 56 year old Caucasian female here today for a checkup.  She is due for a flu shot.  Her blood pressure is elevated today but she has been out of her carvedilol.  She has not taken any carvedilol since Friday.  She also requested we can switch to a medication that last 24 hours because she often forgets to take the carvedilol at night.  She denies any chest pain but she does report some dyspnea on exertion.  I am concerned because her BMI is 59.7.  Her insurance will cover weight loss medication and she is interested in the GLP-1 agonist which we discussed at her last visit.  We discussed repeating an ultrasound  regarding her multinodular goiter and the patient prefers not to at the present time given its stability on previous ultrasounds as well as her busy schedule at work. Past Medical History:  Diagnosis Date   Cancer [facility])    Uterine cancer dx. surgery planned TAH   Hypertension    not taking med x1 yr. couldn't afford.   Multiple thyroid nodules    - FNA 2019, surgery recommends annual US   Ovarian cancer ([place])    PONV (postoperative nausea and vomiting)    Primary hyperparathyroidism ([place])    Uterine cancer ([place])    UTI (lower urinary tract infection)    history of 1 month ago   Past Surgical History:  Procedure Laterality Date   BIOPSY  03/08/2021   Procedure: BIOPSY;  Surgeon: Sharyn Creamer, MD;  Location: [facility];  Service: Gastroenterology;;   CHOLECYSTECTOMY     laparoscopic   COLONOSCOPY WITH PROPOFOL N/A 03/08/2021   Procedure: COLONOSCOPY WITH PROPOFOL;  Surgeon: Sharyn Creamer, MD;  Location: [facility];  Service: Gastroenterology;  Laterality: N/A;   PARATHYROIDECTOMY Right 02/07/2017   Procedure: MINIMALLY INVASIVE PARATHYROIDECTOMY;  Surgeon: Fanny Skates, MD;  Location: [facility];  Service: General;  Laterality: Right;   POLYPECTOMY  03/08/2021   Procedure: POLYPECTOMY;  Surgeon: Sharyn Creamer, MD;  Location: [facility];  Service: Gastroenterology;;   ROBOTIC ASSISTED TOTAL HYSTERECTOMY WITH BILATERAL SALPINGO OOPHERECTOMY Bilateral 09/28/2013   Procedure:  ROBOTIC ASSISTED TOTAL HYSTERECTOMY WITH BILATERAL SALPINGO OOPHORECTOMY WITH  LYMPH NODE DISECTION, ;  Surgeon: Everitt Amber, MD;  Location: [facility];  Service: Gynecology;  Laterality: Bilateral;   Current Outpatient Medications on File Prior to Visit  Medication Sig Dispense Refill   acetaminophen (TYLENOL) 650 MG CR tablet Take 1,300 mg by mouth every 8 (eight) hours as needed for pain.     Carboxymethylcellul-Glycerin (LUBRICATING EYE DROPS OP) Place 1 drop into both eyes daily as needed (dry eyes).     carvedilol  (COREG) 6.25 MG tablet TAKE 2 TABLETS (12.5 MG) BY MOUTH 2 TIMES DAILY WITH A MEAL. 120 tablet 0   cetirizine (ZYRTEC) 10 MG tablet Take 10 mg by mouth daily.     loperamide (IMODIUM A-D) 2 MG tablet Take 1 mg by mouth daily.     Menthol-Methyl Salicylate (SALONPAS PAIN RELIEF PATCH EX) Apply 1 patch topically daily as needed (pain).     venlafaxine XR (EFFEXOR-XR) 75 MG 24 hr capsule TAKE 1 CAPSULE BY MOUTH EVERY MORNING WITH BREAKFAST 90 capsule 3   No current facility-administered medications on file prior to visit.   Allergies  Allergen Reactions   Contrast Media [Iodinated Contrast Media] Anaphylaxis and Other (See Comments)    Swelling of the throat, difficulty breathing   Tetanus Toxoids Swelling and Other (See Comments)    Swelling at site, lethargic x few days   Social History   Socioeconomic History   Marital status: Single    Spouse name: Not on file   Number of children: Not on file   Years of education: Not on file   Highest education level: Not on file  Occupational History   Not on file  Tobacco Use   Smoking status: Never   Smokeless tobacco: Never  Vaping Use   Vaping Use: Never used  Substance and Sexual Activity   Alcohol use: No   Drug use: No   Sexual activity: Not Currently  Other Topics Concern   Not on file  Social History Narrative   Not on file   Social Determinants of Health   Financial Resource Strain: Not on file  Food Insecurity: Not on file  Transportation Needs: Not on file  Physical Activity: Not on file  Stress: Not on file  Social Connections: Not on file  Intimate Partner Violence: Not on file      Review of Systems  All other systems reviewed and are negative.      Objective:   Physical Exam Vitals reviewed.  Constitutional:      Appearance: She is well-developed.  HENT:     Head: Normocephalic and atraumatic.     Right Ear: Tympanic membrane, ear canal and external ear normal. No decreased hearing noted.     Left  Ear: Tympanic membrane, ear canal and external ear normal. No decreased hearing noted.     Nose: Nose normal.     Mouth/Throat:     Pharynx: No oropharyngeal exudate.  Cardiovascular:     Rate and Rhythm: Normal rate and regular rhythm.     Heart sounds: Normal heart sounds. No murmur heard.    No friction rub. No gallop.  Pulmonary:     Effort: Pulmonary effort is normal. No respiratory distress.     Breath sounds: Normal breath sounds. No wheezing or rales.  Chest:     Chest wall: No tenderness.  Neurological:     Mental Status: She is alert and oriented to person, place, and time.  Cranial Nerves: No cranial nerve deficit.     Motor: No abnormal muscle tone.     Coordination: Coordination normal.     Deep Tendon Reflexes: Reflexes are normal and symmetric.            Assessment & Plan:  Multinodular goiter - Plan: CBC with Differential/Platelet, COMPLETE METABOLIC PANEL WITH GFR, TSH  Essential hypertension - Plan: CBC with Differential/Platelet, Lipid panel, COMPLETE METABOLIC PANEL WITH GFR  BMI 50.0-59.9, adult (HCC) Blood pressure is elevated today.  I will discontinue carvedilol due to the fact that his twice daily and switch to Bystolic 10 mg once daily to improve patient compliance.  I will check a CBC, CMP, and a TSH along with a fasting lipid panel.  Patient elects not to repeat a thyroid ultrasound at the present time due to her busy schedule work.  Patient received her flu shot today.  I will start the patient on Wegovy 0.5 mg subcu weekly.  Uptitrate on a monthly basis to the maximum dose as long as tolerated.  I cautioned the patient about diarrhea and nausea potential side effects

## 2021-10-23 ENCOUNTER — Telehealth: Payer: Self-pay

## 2021-10-23 LAB — COMPLETE METABOLIC PANEL WITH GFR
AG Ratio: 1.6 (calc) (ref 1.0–2.5)
ALT: 19 U/L (ref 6–29)
AST: 23 U/L (ref 10–35)
Albumin: 4.5 g/dL (ref 3.6–5.1)
Alkaline phosphatase (APISO): 66 U/L (ref 37–153)
BUN: 10 mg/dL (ref 7–25)
CO2: 27 mmol/L (ref 20–32)
Calcium: 9.5 mg/dL (ref 8.6–10.4)
Chloride: 102 mmol/L (ref 98–110)
Creat: 0.57 mg/dL (ref 0.50–1.03)
Globulin: 2.8 g/dL (calc) (ref 1.9–3.7)
Glucose, Bld: 82 mg/dL (ref 65–99)
Potassium: 4 mmol/L (ref 3.5–5.3)
Sodium: 142 mmol/L (ref 135–146)
Total Bilirubin: 0.4 mg/dL (ref 0.2–1.2)
Total Protein: 7.3 g/dL (ref 6.1–8.1)
eGFR: 109 mL/min/{1.73_m2} (ref >=60)

## 2021-10-23 LAB — LIPID PANEL
Cholesterol: 157 mg/dL (ref <200)
HDL: 50 mg/dL (ref >=50)
LDL Cholesterol (Calc): 82 mg/dL (calc)
Non-HDL Cholesterol (Calc): 107 mg/dL (calc) (ref <130)
Total CHOL/HDL Ratio: 3.1 (calc) (ref <5.0)
Triglycerides: 152 mg/dL — ABNORMAL HIGH (ref <150)

## 2021-10-23 LAB — CBC WITH DIFFERENTIAL/PLATELET
Absolute Monocytes: 422 cells/uL (ref 200–950)
Basophils Absolute: 60 cells/uL (ref 0–200)
Basophils Relative: 0.9 %
Eosinophils Absolute: 94 cells/uL (ref 15–500)
Eosinophils Relative: 1.4 %
HCT: 40.1 % (ref 35.0–45.0)
Hemoglobin: 13.5 g/dL (ref 11.7–15.5)
Lymphs Abs: 1950 cells/uL (ref 850–3900)
MCH: 28.5 pg (ref 27.0–33.0)
MCHC: 33.7 g/dL (ref 32.0–36.0)
MCV: 84.6 fL (ref 80.0–100.0)
MPV: 10.1 fL (ref 7.5–12.5)
Monocytes Relative: 6.3 %
Neutro Abs: 4174 cells/uL (ref 1500–7800)
Neutrophils Relative %: 62.3 %
Platelets: 227 10*3/uL (ref 140–400)
RBC: 4.74 10*6/uL (ref 3.80–5.10)
RDW: 12.3 % (ref 11.0–15.0)
Total Lymphocyte: 29.1 %
WBC: 6.7 10*3/uL (ref 3.8–10.8)

## 2021-10-23 LAB — TSH: TSH: 3.21 mIU/L

## 2021-10-23 NOTE — Telephone Encounter (Signed)
Pharmacy faxed a refill request for venlafaxine XR (EFFEXOR-XR) 75 MG 24 hr capsule [[id number]]    Order Details Dose, Route, Frequency: As Directed  Dispense Quantity: 90 capsule Refills: 3        Sig: TAKE 1 CAPSULE BY MOUTH EVERY MORNING WITH BREAKFAST       Start Date: 09/13/20 End Date: --  Written Date: 09/13/20 Expiration Date: 09/13/21  Original Order:  venlafaxine XR (EFFEXOR-XR) 75 MG 24 hr capsule [[id number]]    LOV: 10/22/21  [facility] [address]

## 2021-10-24 ENCOUNTER — Other Ambulatory Visit: Payer: Self-pay

## 2021-10-24 DIAGNOSIS — F411 Generalized anxiety disorder: Secondary | ICD-10-CM

## 2021-10-24 MED ORDER — VENLAFAXINE HCL ER 75 MG PO CP24: ORAL_CAPSULE | ORAL | 3 refills | Status: DC

## 2021-11-09 ENCOUNTER — Telehealth: Payer: Self-pay

## 2021-11-09 NOTE — Telephone Encounter (Signed)
Send in Appeal for PA-Wegovy 11/09/21

## 2022-02-19 ENCOUNTER — Observation Stay (HOSPITAL_COMMUNITY): Payer: BC Managed Care – PPO

## 2022-02-19 ENCOUNTER — Encounter (HOSPITAL_COMMUNITY): Payer: Self-pay

## 2022-02-19 ENCOUNTER — Observation Stay (HOSPITAL_COMMUNITY)
Admission: EM | Admit: 2022-02-19 | Discharge: 2022-02-20 | Disposition: A | Discharge status: Home / Self Care | Payer: BC Managed Care – PPO | Attending: Surgery | Admitting: Surgery

## 2022-02-19 ENCOUNTER — Emergency Department (HOSPITAL_COMMUNITY): Payer: BC Managed Care – PPO

## 2022-02-19 ENCOUNTER — Other Ambulatory Visit: Payer: Self-pay

## 2022-02-19 DIAGNOSIS — S066X0A Traumatic subarachnoid hemorrhage without loss of consciousness, initial encounter: Secondary | ICD-10-CM | POA: Diagnosis not present

## 2022-02-19 DIAGNOSIS — G930 Cerebral cysts: Secondary | ICD-10-CM | POA: Insufficient documentation

## 2022-02-19 DIAGNOSIS — I609 Nontraumatic subarachnoid hemorrhage, unspecified: Secondary | ICD-10-CM

## 2022-02-19 DIAGNOSIS — Z79899 Other long term (current) drug therapy: Secondary | ICD-10-CM | POA: Diagnosis not present

## 2022-02-19 DIAGNOSIS — I10 Essential (primary) hypertension: Secondary | ICD-10-CM | POA: Insufficient documentation

## 2022-02-19 DIAGNOSIS — Z7985 Long-term (current) use of injectable non-insulin antidiabetic drugs: Secondary | ICD-10-CM | POA: Insufficient documentation

## 2022-02-19 DIAGNOSIS — Y92481 Parking lot as the place of occurrence of the external cause: Secondary | ICD-10-CM | POA: Insufficient documentation

## 2022-02-19 DIAGNOSIS — Z8542 Personal history of malignant neoplasm of other parts of uterus: Secondary | ICD-10-CM | POA: Diagnosis not present

## 2022-02-19 DIAGNOSIS — S0990XA Unspecified injury of head, initial encounter: Secondary | ICD-10-CM | POA: Diagnosis present

## 2022-02-19 LAB — COMPREHENSIVE METABOLIC PANEL
ALT: 15 U/L (ref 0–44)
AST: 22 U/L (ref 15–41)
Albumin: 4 g/dL (ref 3.5–5.0)
Alkaline Phosphatase: 70 U/L (ref 38–126)
Anion gap: 11 (ref 5–15)
BUN: 8 mg/dL (ref 6–20)
CO2: 24 mmol/L (ref 22–32)
Calcium: 9.3 mg/dL (ref 8.9–10.3)
Chloride: 106 mmol/L (ref 98–111)
Creatinine, Ser: 0.78 mg/dL (ref 0.44–1.00)
GFR, Estimated: >60 mL/min (ref >60)
Glucose, Bld: 113 mg/dL — ABNORMAL HIGH (ref 70–99)
Potassium: 3 mmol/L — ABNORMAL LOW (ref 3.5–5.1)
Sodium: 141 mmol/L (ref 135–145)
Total Bilirubin: 0.6 mg/dL (ref 0.3–1.2)
Total Protein: 7.2 g/dL (ref 6.5–8.1)

## 2022-02-19 LAB — SAMPLE TO BLOOD BANK

## 2022-02-19 LAB — I-STAT CHEM 8, ED
BUN: 9 mg/dL (ref 6–20)
Calcium, Ion: 1.16 mmol/L (ref 1.15–1.40)
Chloride: 103 mmol/L (ref 98–111)
Creatinine, Ser: 0.6 mg/dL (ref 0.44–1.00)
Glucose, Bld: 108 mg/dL — ABNORMAL HIGH (ref 70–99)
HCT: 43 % (ref 36.0–46.0)
Hemoglobin: 14.6 g/dL (ref 12.0–15.0)
Potassium: 3 mmol/L — ABNORMAL LOW (ref 3.5–5.1)
Sodium: 143 mmol/L (ref 135–145)
TCO2: 24 mmol/L (ref 22–32)

## 2022-02-19 LAB — PROTIME-INR
INR: 1 (ref 0.8–1.2)
Prothrombin Time: 13.3 seconds (ref 11.4–15.2)

## 2022-02-19 LAB — CBC
HCT: 43 % (ref 36.0–46.0)
Hemoglobin: 14 g/dL (ref 12.0–15.0)
MCH: 28.3 pg (ref 26.0–34.0)
MCHC: 32.6 g/dL (ref 30.0–36.0)
MCV: 87 fL (ref 80.0–100.0)
Platelets: 231 10*3/uL (ref 150–400)
RBC: 4.94 MIL/uL (ref 3.87–5.11)
RDW: 12.1 % (ref 11.5–15.5)
WBC: 8.4 10*3/uL (ref 4.0–10.5)
nRBC: 0 % (ref 0.0–0.2)

## 2022-02-19 LAB — ETHANOL: Alcohol, Ethyl (B): <10 mg/dL (ref <10)

## 2022-02-19 LAB — LACTIC ACID, PLASMA: Lactic Acid, Venous: 2.3 mmol/L (ref 0.5–1.9)

## 2022-02-19 MED ORDER — ONDANSETRON HCL 4 MG/2ML IJ SOLN
4.0000 mg | Freq: Four times a day (QID) | INTRAMUSCULAR | Status: DC | PRN
  Administered 2022-02-19 – 2022-02-20 (×2): 4 mg via INTRAVENOUS
  Filled 2022-02-19 (×2): qty 2

## 2022-02-19 MED ORDER — DOCUSATE SODIUM 100 MG PO CAPS
100.0000 mg | ORAL_CAPSULE | Freq: Two times a day (BID) | ORAL | Status: DC
  Administered 2022-02-19 – 2022-02-20 (×2): 100 mg via ORAL
  Filled 2022-02-19 (×2): qty 1

## 2022-02-19 MED ORDER — ONDANSETRON HCL 4 MG/2ML IJ SOLN
INTRAMUSCULAR | Status: AC
  Administered 2022-02-19: 2 mg
  Filled 2022-02-19: qty 2

## 2022-02-19 MED ORDER — PROCHLORPERAZINE EDISYLATE 10 MG/2ML IJ SOLN
10.0000 mg | INTRAMUSCULAR | Status: DC | PRN
  Administered 2022-02-20: 10 mg via INTRAVENOUS
  Filled 2022-02-19: qty 2

## 2022-02-19 MED ORDER — SIMETHICONE 80 MG PO CHEW: 80.0000 mg | CHEWABLE_TABLET | Freq: Four times a day (QID) | ORAL | Status: DC | PRN

## 2022-02-19 MED ORDER — OXYCODONE HCL 5 MG PO TABS: 5.0000 mg | ORAL_TABLET | ORAL | Status: DC | PRN

## 2022-02-19 MED ORDER — ACETAMINOPHEN 325 MG PO TABS
650.0000 mg | ORAL_TABLET | Freq: Four times a day (QID) | ORAL | Status: DC
  Administered 2022-02-19 – 2022-02-20 (×3): 650 mg via ORAL
  Filled 2022-02-19 (×3): qty 2

## 2022-02-19 MED ORDER — ONDANSETRON HCL 4 MG/2ML IJ SOLN: 4.0000 mg | Freq: Once | INTRAMUSCULAR | Status: AC

## 2022-02-19 NOTE — ED Notes (Signed)
X-ray at bedside

## 2022-02-19 NOTE — ED Notes (Signed)
Patient transported to CT. 

## 2022-02-19 NOTE — TOC CAGE-AID Note (Signed)
Transition of Care [facility]) - CAGE-AID Screening   Patient Details  Name: Billiejo Sorto MRN: [id number] Date of Birth: 1965-11-03  Transition of Care (TOC) CM/SW Contact:    Army Melia, RN Phone Number:[phone] 02/19/2022, 10:29 PM  CAGE-AID Screening:    Have You Ever Felt You Ought to Cut Down on Your Drinking or Drug Use?: No Have People Annoyed You By Critizing Your Drinking Or Drug Use?: No Have You Felt Bad Or Guilty About Your Drinking Or Drug Use?: No Have You Ever Had a Drink or Used Drugs First Thing In The Morning to Steady Your Nerves or to Get Rid of a Hangover?: No CAGE-AID Score: 0  Substance Abuse Education Offered: No
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
## 2022-02-19 NOTE — ED Notes (Signed)
Patient transported to MRI 
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
## 2022-02-19 NOTE — ED Notes (Signed)
Patient is resting comfortably. 
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
## 2022-02-19 NOTE — ED Notes (Signed)
Tech unable to hear a manual BP upon arrival.

## 2022-02-20 LAB — HIV ANTIBODY (ROUTINE TESTING W REFLEX): HIV Screen 4th Generation wRfx: NONREACTIVE

## 2022-02-20 MED ORDER — METHOCARBAMOL 500 MG PO TABS: 500.0000 mg | ORAL_TABLET | Freq: Four times a day (QID) | ORAL | 0 refills | Status: DC | PRN

## 2022-02-20 MED ORDER — HYDRALAZINE HCL 20 MG/ML IJ SOLN: 10.0000 mg | Freq: Four times a day (QID) | INTRAMUSCULAR | Status: DC | PRN

## 2022-02-20 MED ORDER — LEVETIRACETAM 500 MG PO TABS
500.0000 mg | ORAL_TABLET | Freq: Two times a day (BID) | ORAL | Status: DC
  Administered 2022-02-20: 500 mg via ORAL
  Filled 2022-02-20: qty 1

## 2022-02-20 MED ORDER — ONDANSETRON HCL 4 MG PO TABS: 4.0000 mg | ORAL_TABLET | Freq: Four times a day (QID) | ORAL | 0 refills | Status: AC

## 2022-02-20 MED ORDER — LEVETIRACETAM 500 MG PO TABS: 500.0000 mg | ORAL_TABLET | Freq: Two times a day (BID) | ORAL | 0 refills | Status: DC

## 2022-02-20 MED ORDER — ENOXAPARIN SODIUM 30 MG/0.3ML IJ SOSY: 30.0000 mg | PREFILLED_SYRINGE | Freq: Two times a day (BID) | INTRAMUSCULAR | Status: DC

## 2022-02-20 MED ORDER — OXYCODONE HCL 5 MG PO TABS: 5.0000 mg | ORAL_TABLET | ORAL | Status: DC | PRN

## 2022-02-20 MED ORDER — ACETAMINOPHEN 500 MG PO TABS: 1000.0000 mg | ORAL_TABLET | Freq: Three times a day (TID) | ORAL | 0 refills | Status: AC | PRN

## 2022-02-20 MED ORDER — ACETAMINOPHEN 500 MG PO TABS
1000.0000 mg | ORAL_TABLET | Freq: Four times a day (QID) | ORAL | Status: DC
  Administered 2022-02-20: 1000 mg via ORAL
  Filled 2022-02-20: qty 2

## 2022-02-20 MED ORDER — METHOCARBAMOL 500 MG PO TABS: 500.0000 mg | ORAL_TABLET | Freq: Four times a day (QID) | ORAL | Status: DC | PRN
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
## 2022-02-20 NOTE — Progress Notes (Signed)
Patient arrived on the unit from ED. Patient is alert and oriented. Vital signs are within normal range. Patient orientated with the unit.

## 2022-02-20 NOTE — Discharge Instructions (Addendum)
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 Please follow up with Dr. Annette Stable of Neurosurgery in 2 weeks for this.
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
## 2022-02-28 ENCOUNTER — Encounter: Payer: Self-pay | Admitting: Physical Medicine & Rehabilitation

## 2022-03-01 ENCOUNTER — Encounter: Payer: Self-pay | Admitting: Family Medicine

## 2022-03-01 ENCOUNTER — Ambulatory Visit: Payer: BC Managed Care – PPO | Admitting: Family Medicine

## 2022-03-01 VITALS — BP 126/82 | HR 88 | Ht 61.0 in | Wt 324.0 lb

## 2022-03-01 DIAGNOSIS — G44309 Post-traumatic headache, unspecified, not intractable: Secondary | ICD-10-CM | POA: Diagnosis not present

## 2022-03-01 DIAGNOSIS — R42 Dizziness and giddiness: Secondary | ICD-10-CM | POA: Diagnosis not present

## 2022-03-01 DIAGNOSIS — S060X9D Concussion with loss of consciousness of unspecified duration, subsequent encounter: Secondary | ICD-10-CM

## 2022-03-01 DIAGNOSIS — Q046 Congenital cerebral cysts: Secondary | ICD-10-CM | POA: Diagnosis not present
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
## 2022-03-06 ENCOUNTER — Telehealth: Payer: Self-pay | Admitting: Family Medicine

## 2022-03-06 NOTE — Telephone Encounter (Signed)
Received call from patient's sister Hassan Rowan to request letter from provider; patient needs letter to state she's been released to return to work for only half days starting Monday, 03/11/20. Requesting for letter to be sent to her via [place].  Please advise with any questions at [phone] or [phone].

## 2022-03-08 ENCOUNTER — Encounter: Payer: Self-pay | Admitting: Family Medicine

## 2022-03-20 ENCOUNTER — Encounter: Payer: Self-pay | Admitting: Physical Medicine & Rehabilitation

## 2022-03-20 ENCOUNTER — Encounter
Payer: BC Managed Care – PPO | Attending: Physical Medicine & Rehabilitation | Admitting: Physical Medicine & Rehabilitation

## 2022-03-20 VITALS — BP 141/91 | HR 72 | Ht 62.0 in | Wt 327.8 lb

## 2022-03-20 DIAGNOSIS — X58XXXD Exposure to other specified factors, subsequent encounter: Secondary | ICD-10-CM | POA: Insufficient documentation

## 2022-03-20 DIAGNOSIS — S062XAS 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, sequela: Secondary | ICD-10-CM

## 2022-03-20 DIAGNOSIS — F329 Major depressive disorder, single episode, unspecified: Secondary | ICD-10-CM | POA: Diagnosis not present

## 2022-03-20 DIAGNOSIS — H8110 Benign paroxysmal vertigo, unspecified ear: Secondary | ICD-10-CM | POA: Diagnosis not present

## 2022-03-20 DIAGNOSIS — S062XAD 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, subsequent encounter: Secondary | ICD-10-CM

## 2022-03-20 MED ORDER — VENLAFAXINE HCL ER 150 MG PO CP24: 150.0000 mg | ORAL_CAPSULE | Freq: Every day | ORAL | 3 refills | Status: DC

## 2022-03-20 NOTE — Patient Instructions (Addendum)
ALWAYS FEEL FREE TO CALL OUR OFFICE WITH ANY PROBLEMS OR QUESTIONS [phone])  **PLEASE NOTE** ALL MEDICATION REFILL REQUESTS (INCLUDING CONTROLLED SUBSTANCES) NEED TO BE MADE AT LEAST 7 DAYS PRIOR TO REFILL BEING DUE. ANY REFILL REQUESTS INSIDE THAT TIME FRAME MAY RESULT IN DELAYS IN RECEIVING YOUR PRESCRIPTION.   GOOD SLEEP HYGIENE -REGULAR SLEEP TIME -BACKGROUND NOISE/MUSIC  MOOD: INCREASE EFFEXOR TO 150MG DAILY  VERTIGO: VESTIBULAR REHAB  HEADACHES: TYLENOL 500MG THEE X DAILY
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
## 2022-03-25 ENCOUNTER — Ambulatory Visit: Payer: BC Managed Care – PPO | Attending: Physical Medicine & Rehabilitation | Admitting: Physical Therapy

## 2022-03-25 ENCOUNTER — Encounter: Payer: Self-pay | Admitting: Physical Therapy

## 2022-03-25 VITALS — BP 124/71 | HR 72

## 2022-03-25 DIAGNOSIS — H8112 Benign paroxysmal vertigo, left ear: Secondary | ICD-10-CM | POA: Diagnosis present

## 2022-03-25 DIAGNOSIS — H8110 Benign paroxysmal vertigo, unspecified ear: Secondary | ICD-10-CM | POA: Insufficient documentation

## 2022-03-25 DIAGNOSIS — H8111 Benign paroxysmal vertigo, right ear: Secondary | ICD-10-CM | POA: Insufficient documentation

## 2022-03-25 DIAGNOSIS — R2681 Unsteadiness on feet: Secondary | ICD-10-CM | POA: Diagnosis present

## 2022-03-25 DIAGNOSIS — S062XAS 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, sequela: Secondary | ICD-10-CM | POA: Diagnosis not present
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
## 2022-04-01 ENCOUNTER — Encounter: Payer: Self-pay | Admitting: Physical Therapy

## 2022-04-01 ENCOUNTER — Ambulatory Visit: Payer: BC Managed Care – PPO | Attending: Physical Medicine & Rehabilitation | Admitting: Physical Therapy

## 2022-04-01 VITALS — BP 146/81 | HR 93

## 2022-04-01 DIAGNOSIS — H8111 Benign paroxysmal vertigo, right ear: Secondary | ICD-10-CM | POA: Insufficient documentation

## 2022-04-01 DIAGNOSIS — R2681 Unsteadiness on feet: Secondary | ICD-10-CM | POA: Insufficient documentation

## 2022-04-01 DIAGNOSIS — H8112 Benign paroxysmal vertigo, left ear: Secondary | ICD-10-CM | POA: Insufficient documentation
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
## 2022-04-03 ENCOUNTER — Ambulatory Visit: Payer: BC Managed Care – PPO

## 2022-04-03 DIAGNOSIS — H8111 Benign paroxysmal vertigo, right ear: Secondary | ICD-10-CM

## 2022-04-03 DIAGNOSIS — R2681 Unsteadiness on feet: Secondary | ICD-10-CM
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
## 2022-04-08 ENCOUNTER — Encounter: Payer: Self-pay | Admitting: Physical Therapy

## 2022-04-08 ENCOUNTER — Ambulatory Visit: Payer: BC Managed Care – PPO | Admitting: Physical Therapy

## 2022-04-08 VITALS — BP 140/84 | HR 77

## 2022-04-08 DIAGNOSIS — H8111 Benign paroxysmal vertigo, right ear: Secondary | ICD-10-CM | POA: Diagnosis not present

## 2022-04-08 DIAGNOSIS — R2681 Unsteadiness on feet: Secondary | ICD-10-CM

## 2022-04-08 DIAGNOSIS — H8112 Benign paroxysmal vertigo, left ear: Secondary | ICD-10-CM
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
## 2022-04-10 ENCOUNTER — Ambulatory Visit: Payer: BC Managed Care – PPO | Admitting: Physical Therapy

## 2022-04-21 ENCOUNTER — Other Ambulatory Visit: Payer: Self-pay | Admitting: Physical Medicine & Rehabilitation

## 2022-04-21 DIAGNOSIS — H8110 Benign paroxysmal vertigo, unspecified ear: Secondary | ICD-10-CM

## 2022-04-21 DIAGNOSIS — F329 Major depressive disorder, single episode, unspecified: Secondary | ICD-10-CM

## 2022-04-21 DIAGNOSIS — S062XAS 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, sequela: Secondary | ICD-10-CM

## 2022-04-25 ENCOUNTER — Ambulatory Visit: Payer: BC Managed Care – PPO | Admitting: Family Medicine

## 2022-05-22 ENCOUNTER — Encounter
Payer: BC Managed Care – PPO | Attending: Physical Medicine & Rehabilitation | Admitting: Physical Medicine & Rehabilitation

## 2022-05-22 ENCOUNTER — Encounter: Payer: Self-pay | Admitting: Physical Medicine & Rehabilitation

## 2022-05-22 VITALS — BP 147/93 | HR 78 | Ht 62.0 in | Wt 325.0 lb

## 2022-05-22 DIAGNOSIS — S062XAD 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, subsequent encounter: Secondary | ICD-10-CM | POA: Diagnosis not present

## 2022-05-22 DIAGNOSIS — F411 Generalized anxiety disorder: Secondary | ICD-10-CM | POA: Insufficient documentation

## 2022-05-22 DIAGNOSIS — H8110 Benign paroxysmal vertigo, unspecified ear: Secondary | ICD-10-CM | POA: Diagnosis not present

## 2022-05-22 DIAGNOSIS — S062XAS 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, sequela: Secondary | ICD-10-CM | POA: Insufficient documentation
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
## 2022-05-22 NOTE — Patient Instructions (Signed)
ALWAYS FEEL FREE TO CALL OUR OFFICE WITH ANY PROBLEMS OR QUESTIONS [phone])  **PLEASE NOTE** ALL MEDICATION REFILL REQUESTS (INCLUDING CONTROLLED SUBSTANCES) NEED TO BE MADE AT LEAST 7 DAYS PRIOR TO REFILL BEING DUE. ANY REFILL REQUESTS INSIDE THAT TIME FRAME MAY RESULT IN DELAYS IN RECEIVING YOUR PRESCRIPTION.   CONTINUE YOUR GAZE/VERTIGO EXERCISES ONCE PER WEEK FOR ANOTHER MONTH OR TWO

## 2022-08-21 ENCOUNTER — Encounter: Payer: Self-pay | Admitting: Physical Medicine & Rehabilitation

## 2022-08-21 ENCOUNTER — Encounter
Payer: BC Managed Care – PPO | Attending: Physical Medicine & Rehabilitation | Admitting: Physical Medicine & Rehabilitation

## 2022-08-21 VITALS — BP 125/84 | HR 78 | Ht 62.0 in | Wt 324.0 lb

## 2022-08-21 DIAGNOSIS — S062XAS 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, sequela: Secondary | ICD-10-CM | POA: Diagnosis present

## 2022-08-21 DIAGNOSIS — H8110 Benign paroxysmal vertigo, unspecified ear: Secondary | ICD-10-CM | POA: Diagnosis present

## 2022-08-21 NOTE — Progress Notes (Signed)
Subjective:    Patient ID: Shannon [name], female    DOB: 20-Sep-1965, 57 y.o.   MRN: [id number]  HPI  Shannon [name] is doing well. She has a headache every once and awhile but for the most part she can deal with it usually with a break.  Her mood has been very positive.  She remains on Effexor as prescribed.  She handles the duties of her job without any issues.  She feels that she is really nearly back to baseline.  She is excited however, because she plans to retire October 1st and is excited that she can spend time with her grand kid  She will have occasional dizziness when she turns her head to the right but is very limited.  She admits that she is no longer doing her vestibular exercises at home.  Pain Inventory Average Pain 3 Pain Right Now 0 My pain is intermittent  In the last 24 hours, has pain interfered with the following? General activity 0 Relation with others 0 Enjoyment of life 0 What TIME of day is your pain at its worst? evening Sleep (in general) Fair  Pain is worse with:  stress Pain improves with: medication Relief from Meds: 9  Family History  Problem Relation Age of Onset   Breast cancer Sister 1   Hyperparathyroidism Sister    Cancer Maternal Uncle 83       unknown type of cancer   Cancer Maternal Uncle 40       colorectal cancer   Cancer Maternal Uncle 75       throat cancer, smoker   Cancer Paternal Aunt 77       bone cancer   Cancer Cousin 45       mat first cousin with throat cancer    Social History   Socioeconomic History   Marital status: Single    Spouse name: Not on file   Number of children: Not on file   Years of education: Not on file   Highest education level: Not on file  Occupational History   Not on file  Tobacco Use   Smoking status: Never   Smokeless tobacco: Never  Vaping Use   Vaping status: Never Used  Substance and Sexual Activity   Alcohol use: No   Drug use: No   Sexual activity: Not Currently  Other  Topics Concern   Not on file  Social History Narrative   Not on file   Social Determinants of Health   Financial Resource Strain: Not on file  Food Insecurity: Not on file  Transportation Needs: Not on file  Physical Activity: Not on file  Stress: Not on file  Social Connections: Not on file   Past Surgical History:  Procedure Laterality Date   BIOPSY  03/08/2021   Procedure: BIOPSY;  Surgeon: Imogene Burn, MD;  Location: [facility];  Service: Gastroenterology;;   CHOLECYSTECTOMY     laparoscopic   COLONOSCOPY WITH PROPOFOL N/A 03/08/2021   Procedure: COLONOSCOPY WITH PROPOFOL;  Surgeon: Imogene Burn, MD;  Location: [facility];  Service: Gastroenterology;  Laterality: N/A;   PARATHYROIDECTOMY Right 02/07/2017   Procedure: MINIMALLY INVASIVE PARATHYROIDECTOMY;  Surgeon: Claud Kelp, MD;  Location: [facility];  Service: General;  Laterality: Right;   POLYPECTOMY  03/08/2021   Procedure: POLYPECTOMY;  Surgeon: Imogene Burn, MD;  Location: [facility];  Service: Gastroenterology;;   ROBOTIC ASSISTED TOTAL HYSTERECTOMY WITH BILATERAL SALPINGO OOPHERECTOMY Bilateral 09/28/2013   Procedure: ROBOTIC ASSISTED TOTAL  HYSTERECTOMY WITH BILATERAL SALPINGO OOPHORECTOMY WITH  LYMPH NODE DISECTION, ;  Surgeon: Adolphus Birchwood, MD;  Location: [facility];  Service: Gynecology;  Laterality: Bilateral;   Past Surgical History:  Procedure Laterality Date   BIOPSY  03/08/2021   Procedure: BIOPSY;  Surgeon: Imogene Burn, MD;  Location: [facility];  Service: Gastroenterology;;   CHOLECYSTECTOMY     laparoscopic   COLONOSCOPY WITH PROPOFOL N/A 03/08/2021   Procedure: COLONOSCOPY WITH PROPOFOL;  Surgeon: Imogene Burn, MD;  Location: [facility];  Service: Gastroenterology;  Laterality: N/A;   PARATHYROIDECTOMY Right 02/07/2017   Procedure: MINIMALLY INVASIVE PARATHYROIDECTOMY;  Surgeon: Claud Kelp, MD;  Location: [facility];  Service: General;  Laterality: Right;   POLYPECTOMY  03/08/2021   Procedure:  POLYPECTOMY;  Surgeon: Imogene Burn, MD;  Location: [facility];  Service: Gastroenterology;;   ROBOTIC ASSISTED TOTAL HYSTERECTOMY WITH BILATERAL SALPINGO OOPHERECTOMY Bilateral 09/28/2013   Procedure: ROBOTIC ASSISTED TOTAL HYSTERECTOMY WITH BILATERAL SALPINGO OOPHORECTOMY WITH  LYMPH NODE DISECTION, ;  Surgeon: Adolphus Birchwood, MD;  Location: [facility];  Service: Gynecology;  Laterality: Bilateral;   Past Medical History:  Diagnosis Date   Cancer [facility])    Uterine cancer dx. surgery planned TAH   Hypertension    not taking med x1 yr. couldn't afford.   Multiple thyroid nodules    - FNA 2019, surgery recommends annual US   Ovarian cancer (HCC)    PONV (postoperative nausea and vomiting)    Primary hyperparathyroidism (HCC)    Uterine cancer (HCC)    UTI (lower urinary tract infection)    history of 1 month ago   BP 125/84   Pulse 78   Ht 5\' 2"  (1.575 m)   Wt (!) 324 lb (147 kg)   LMP 08/06/2013 Comment: hasn't stopped since  SpO2 95%   BMI 59.26 kg/m   Opioid Risk Score:   Fall Risk Score:  `1  Depression screen [facility] 2/9     05/22/2022   10:31 AM 03/20/2022   10:29 AM 10/22/2021   12:31 PM 09/11/2020    2:53 PM 07/29/2017    9:20 AM 09/15/2015   11:07 AM 07/26/2014    8:23 AM  Depression screen PHQ 2/9  Decreased Interest 0 2 2 0 2 0 0  Down, Depressed, Hopeless 0 2 1 0 1 0 0  PHQ - 2 Score 0 4 3 0 3 0 0  Altered sleeping  3 2  2  0   Tired, decreased energy  2 2  1  0   Change in appetite  3 1  2  0   Feeling bad or failure about yourself   2 1  2  0   Trouble concentrating  2 0  0 0   Moving slowly or fidgety/restless  1 0  0 0   Suicidal thoughts  0 0  0 0   PHQ-9 Score  17 9  10  0   Difficult doing work/chores  Somewhat difficult Somewhat difficult  Somewhat difficult       Review of Systems  Musculoskeletal:        Headache       Objective:   Physical Exam General: No acute distress HEENT: NCAT, EOMI, oral membranes moist Cards: reg rate  Chest: normal  effort Abdomen: Soft, NT, ND Skin: dry, intact Extremities: no edema Psych: pleasant and appropriate  Skin: intact Neuro:  Alert and oriented x 3. Normal insight and awareness. Intact Memory. Normal language and speech. Cranial nerve exam unremarkable.  Assessment & Plan:  Post-concussion syndrome             -BPPV--improved             -insomnia--improved             -reactive depression/anxiety--improved             -mild attention/concentration issues, pt is coping with them             -post-traumatic headaches--improved 2. Incidental colloid cyst at foramen of [place]              -f/u with NS scheduled     Plan: She's almost back to baseline. Very pleased with where she's at! Maintain Effexor at 150 mg daily for depression and anxiety.  Her primary care physician can prescribe Continue to maximize sleep  Tylenol as needed for headaches. Discussed maintenance exercises for vertigo/GAZE stabilization. Consider revisiting therapy if they worsen Patient is working as tolerated.  She is driving without any issues.  .   Over 20 minutes of face to face patient care time were spent during this visit. All questions were encouraged and answered. Follow up with me as needed

## 2022-08-21 NOTE — Patient Instructions (Signed)
WORK ON YOUR VERTIGO EXERCISES ON A REGULAR BASIS 3-5 DAYS PER WEEK.  IF SYMPTOMS WORSEN WE COULD CONSIDER A COURSE OF THERAPY

## 2022-08-21 NOTE — Addendum Note (Signed)
Addended by: Janean Sark on: 08/21/2022 11:12 AM   Modules accepted: Orders

## 2022-10-01 ENCOUNTER — Other Ambulatory Visit: Payer: Self-pay | Admitting: Neurosurgery

## 2022-10-01 DIAGNOSIS — Q046 Congenital cerebral cysts: Secondary | ICD-10-CM

## 2022-10-10 ENCOUNTER — Ambulatory Visit
Admission: RE | Admit: 2022-10-10 | Discharge: 2022-10-10 | Disposition: A | Discharge status: Home / Self Care | Payer: BC Managed Care – PPO | Source: Ambulatory Visit | Attending: Family Medicine | Admitting: Family Medicine

## 2022-10-10 ENCOUNTER — Other Ambulatory Visit: Payer: Self-pay | Admitting: Family Medicine

## 2022-10-10 DIAGNOSIS — Z1231 Encounter for screening mammogram for malignant neoplasm of breast: Secondary | ICD-10-CM

## 2022-10-19 ENCOUNTER — Other Ambulatory Visit: Payer: Self-pay | Admitting: Family Medicine

## 2022-10-21 NOTE — Telephone Encounter (Signed)
Requested Prescriptions  Pending Prescriptions Disp Refills   nebivolol (BYSTOLIC) 10 MG tablet [Pharmacy Med Name: NEBIVOLOL 10 MG TABLET] 90 tablet 0    Sig: TAKE 1 TABLET BY MOUTH EVERY DAY     Cardiovascular: Beta Blockers 3 Failed - 10/19/2022  8:29 AM      Failed - Valid encounter within last 6 months    Recent Outpatient Visits           1 year ago COVID-19   [facility] Valentino Nose, NP   2 years ago Multinodular goiter   [facility] Donita Brooks, MD   3 years ago Primary hyperparathyroidism (HCC)   [facility] Donita Brooks, MD   4 years ago Acute non-recurrent maxillary sinusitis   [facility] Mercedes, Velna Hatchet, MD   5 years ago Primary hyperparathyroidism [facility])   [facility] Pickard, Priscille Heidelberg, MD              Passed - Cr in normal range and within 360 days    Creatinine  Date Value Ref Range Status  10/08/2013 0.7 0.6 - 1.1 mg/dL Final   Creat  Date Value Ref Range Status  10/22/2021 0.57 0.50 - 1.03 mg/dL Final   Creatinine, Ser  Date Value Ref Range Status  02/19/2022 0.60 0.44 - 1.00 mg/dL Final         Passed - AST in normal range and within 360 days    AST  Date Value Ref Range Status  02/19/2022 22 15 - 41 U/L Final  10/08/2013 26 5 - 34 U/L Final         Passed - ALT in normal range and within 360 days    ALT  Date Value Ref Range Status  02/19/2022 15 0 - 44 U/L Final  10/08/2013 24 0 - 55 U/L Final         Passed - Last BP in normal range    BP Readings from Last 1 Encounters:  08/21/22 125/84         Passed - Last Heart Rate in normal range    Pulse Readings from Last 1 Encounters:  08/21/22 78

## 2022-10-24 ENCOUNTER — Telehealth: Payer: Self-pay | Admitting: Family Medicine

## 2022-10-24 ENCOUNTER — Other Ambulatory Visit: Payer: Self-pay | Admitting: Family Medicine

## 2022-10-24 MED ORDER — NIRMATRELVIR/RITONAVIR (PAXLOVID)TABLET: 3.0000 | ORAL_TABLET | Freq: Two times a day (BID) | ORAL | 0 refills | Status: AC

## 2022-10-24 NOTE — Telephone Encounter (Signed)
Patient tested positive for COVID yesterday.   Sx: fever, chills, coughing, congestion, headache, nausea, lack of appetite (food tastes funny), very lethargic  Sx began Tuesday afternoon, 10/22/22  Taking Mucinex; helping some but not a lot.   Requesting script to manage sx.  Pharmacy confirmed as:  [id number] [place], [place] - [address] [name] [address] Phone: [phone]  Fax: [phone] DEA #: [id number]   Please advise at [phone].

## 2022-10-25 ENCOUNTER — Other Ambulatory Visit: Payer: BC Managed Care – PPO

## 2022-11-08 ENCOUNTER — Ambulatory Visit
Admission: RE | Admit: 2022-11-08 | Discharge: 2022-11-08 | Disposition: A | Discharge status: Home / Self Care | Payer: BC Managed Care – PPO | Source: Ambulatory Visit | Attending: Neurosurgery | Admitting: Neurosurgery

## 2022-11-08 DIAGNOSIS — Q046 Congenital cerebral cysts: Secondary | ICD-10-CM

## 2023-01-18 ENCOUNTER — Other Ambulatory Visit: Payer: Self-pay | Admitting: Physical Medicine & Rehabilitation

## 2023-01-18 DIAGNOSIS — H8110 Benign paroxysmal vertigo, unspecified ear: Secondary | ICD-10-CM

## 2023-01-18 DIAGNOSIS — S062XAS 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, sequela: Secondary | ICD-10-CM

## 2023-01-18 DIAGNOSIS — F329 Major depressive disorder, single episode, unspecified: Secondary | ICD-10-CM

## 2023-01-20 ENCOUNTER — Telehealth: Payer: Self-pay

## 2023-01-20 ENCOUNTER — Other Ambulatory Visit: Payer: Self-pay

## 2023-01-20 DIAGNOSIS — I10 Essential (primary) hypertension: Secondary | ICD-10-CM

## 2023-01-20 MED ORDER — NEBIVOLOL HCL 10 MG PO TABS: 10.0000 mg | ORAL_TABLET | Freq: Every day | ORAL | 0 refills | Status: DC

## 2023-01-20 NOTE — Telephone Encounter (Signed)
Prescription Request  01/20/2023  LOV: 04[birth year]  What is the name of the medication or equipment? nebivolol (BYSTOLIC) 10 MG tablet [[id number]]   Have you contacted your pharmacy to request a refill? Yes   Which pharmacy would you like this sent to?  [id number] [place], [place] - [address] [place] [address] Phone: [phone] Fax: [phone]    Patient notified that their request is being sent to the clinical staff for review and that they should receive a response within 2 business days.   Please advise at [facility] [phone]

## 2023-02-09 ENCOUNTER — Other Ambulatory Visit: Payer: Self-pay | Admitting: Physical Medicine & Rehabilitation

## 2023-02-09 DIAGNOSIS — H8110 Benign paroxysmal vertigo, unspecified ear: Secondary | ICD-10-CM

## 2023-02-09 DIAGNOSIS — F329 Major depressive disorder, single episode, unspecified: Secondary | ICD-10-CM

## 2023-02-09 DIAGNOSIS — S062XAS 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, sequela: Secondary | ICD-10-CM

## 2023-02-13 ENCOUNTER — Other Ambulatory Visit: Payer: Self-pay

## 2023-02-13 ENCOUNTER — Telehealth: Payer: Self-pay

## 2023-02-13 ENCOUNTER — Other Ambulatory Visit: Payer: Self-pay | Admitting: Family Medicine

## 2023-02-13 DIAGNOSIS — S062XAS 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, sequela: Secondary | ICD-10-CM

## 2023-02-13 DIAGNOSIS — H8110 Benign paroxysmal vertigo, unspecified ear: Secondary | ICD-10-CM

## 2023-02-13 DIAGNOSIS — F329 Major depressive disorder, single episode, unspecified: Secondary | ICD-10-CM

## 2023-02-13 MED ORDER — VENLAFAXINE HCL ER 150 MG PO CP24: 150.0000 mg | ORAL_CAPSULE | Freq: Every day | ORAL | 1 refills | Status: DC

## 2023-02-13 NOTE — Telephone Encounter (Signed)
Copied from CRM [id number]. Topic: Clinical - Prescription Issue >> Feb 13, 2023  8:49 AM Jorje Guild R wrote: Reason for CRM: venlafaxine XR (EFFEXOR-XR) 150 MG 24 hr capsule Pharmacy told Patient she needed to contact PCP, to make sure for refill.

## 2023-04-20 ENCOUNTER — Other Ambulatory Visit: Payer: Self-pay | Admitting: Family Medicine

## 2023-04-20 DIAGNOSIS — I10 Essential (primary) hypertension: Secondary | ICD-10-CM

## 2023-04-22 NOTE — Telephone Encounter (Signed)
 30 day supply given, OV needed for additional refills.  Requested Prescriptions  Pending Prescriptions Disp Refills   nebivolol (BYSTOLIC) 10 MG tablet [Pharmacy Med Name: NEBIVOLOL 10 MG TABLET] 30 tablet 0    Sig: TAKE 1 TABLET BY MOUTH EVERY DAY     Cardiovascular: Beta Blockers 3 Failed - 04/22/2023  8:26 AM      Failed - Cr in normal range and within 360 days    Creatinine  Date Value Ref Range Status  10/08/2013 0.7 0.6 - 1.1 mg/dL Final   Creat  Date Value Ref Range Status  10/22/2021 0.57 0.50 - 1.03 mg/dL Final   Creatinine, Ser  Date Value Ref Range Status  02/19/2022 0.60 0.44 - 1.00 mg/dL Final         Failed - AST in normal range and within 360 days    AST  Date Value Ref Range Status  02/19/2022 22 15 - 41 U/L Final  10/08/2013 26 5 - 34 U/L Final         Failed - ALT in normal range and within 360 days    ALT  Date Value Ref Range Status  02/19/2022 15 0 - 44 U/L Final  10/08/2013 24 0 - 55 U/L Final         Failed - Valid encounter within last 6 months    Recent Outpatient Visits           2 years ago COVID-19   [facility] Valentino Nose, NP   2 years ago Multinodular goiter   [facility] Donita Brooks, MD   3 years ago Primary hyperparathyroidism [facility])   [facility] Donita Brooks, MD   5 years ago Acute non-recurrent maxillary sinusitis   [facility] [place], Velna Hatchet, MD   5 years ago Primary hyperparathyroidism [facility])   [facility] Pickard, Priscille Heidelberg, MD              Passed - Last BP in normal range    BP Readings from Last 1 Encounters:  08/21/22 125/84         Passed - Last Heart Rate in normal range    Pulse Readings from Last 1 Encounters:  08/21/22 78

## 2023-05-15 ENCOUNTER — Other Ambulatory Visit: Payer: Self-pay | Admitting: Family Medicine

## 2023-05-15 DIAGNOSIS — I10 Essential (primary) hypertension: Secondary | ICD-10-CM

## 2023-05-16 NOTE — Telephone Encounter (Signed)
 OV needed for additional refills.  Requested Prescriptions  Pending Prescriptions Disp Refills   nebivolol  (BYSTOLIC ) 10 MG tablet [Pharmacy Med Name: NEBIVOLOL  10 MG TABLET] 90 tablet 1    Sig: TAKE 1 TABLET BY MOUTH EVERY DAY     Cardiovascular: Beta Blockers 3 Failed - 05/16/2023  9:52 AM      Failed - Cr in normal range and within 360 days    Creatinine  Date Value Ref Range Status  10/08/2013 0.7 0.6 - 1.1 mg/dL Final   Creat  Date Value Ref Range Status  10/22/2021 0.57 0.50 - 1.03 mg/dL Final   Creatinine, Ser  Date Value Ref Range Status  02/19/2022 0.60 0.44 - 1.00 mg/dL Final         Failed - AST in normal range and within 360 days    AST  Date Value Ref Range Status  02/19/2022 22 15 - 41 U/L Final  10/08/2013 26 5 - 34 U/L Final         Failed - ALT in normal range and within 360 days    ALT  Date Value Ref Range Status  02/19/2022 15 0 - 44 U/L Final  10/08/2013 24 0 - 55 U/L Final         Failed - Valid encounter within last 6 months    Recent Outpatient Visits           1 year ago Concussion with loss of consciousness, subsequent encounter   [place] [facility] Austine Lefort, MD   1 year ago Multinodular goiter   [facility] Austine Lefort, MD              Passed - Last BP in normal range    BP Readings from Last 1 Encounters:  08/21/22 125/84         Passed - Last Heart Rate in normal range    Pulse Readings from Last 1 Encounters:  08/21/22 78

## 2023-05-18 ENCOUNTER — Other Ambulatory Visit: Payer: Self-pay | Admitting: Family Medicine

## 2023-05-18 DIAGNOSIS — I10 Essential (primary) hypertension: Secondary | ICD-10-CM

## 2023-05-19 NOTE — Telephone Encounter (Signed)
 Unable to refill per protocol, courtesy refill already given, OV needed.  Requested Prescriptions  Pending Prescriptions Disp Refills   nebivolol  (BYSTOLIC ) 10 MG tablet [Pharmacy Med Name: NEBIVOLOL  10 MG TABLET] 30 tablet 0    Sig: TAKE 1 TABLET BY MOUTH EVERY DAY     Cardiovascular: Beta Blockers 3 Failed - 05/19/2023  2:37 PM      Failed - Cr in normal range and within 360 days    Creatinine  Date Value Ref Range Status  10/08/2013 0.7 0.6 - 1.1 mg/dL Final   Creat  Date Value Ref Range Status  10/22/2021 0.57 0.50 - 1.03 mg/dL Final   Creatinine, Ser  Date Value Ref Range Status  02/19/2022 0.60 0.44 - 1.00 mg/dL Final         Failed - AST in normal range and within 360 days    AST  Date Value Ref Range Status  02/19/2022 22 15 - 41 U/L Final  10/08/2013 26 5 - 34 U/L Final         Failed - ALT in normal range and within 360 days    ALT  Date Value Ref Range Status  02/19/2022 15 0 - 44 U/L Final  10/08/2013 24 0 - 55 U/L Final         Failed - Valid encounter within last 6 months    Recent Outpatient Visits           1 year ago Concussion with loss of consciousness, subsequent encounter   [place] [facility] Austine Lefort, MD   1 year ago Multinodular goiter   [place] [facility] Austine Lefort, MD              Passed - Last BP in normal range    BP Readings from Last 1 Encounters:  08/21/22 125/84         Passed - Last Heart Rate in normal range    Pulse Readings from Last 1 Encounters:  08/21/22 78

## 2023-05-28 ENCOUNTER — Other Ambulatory Visit: Payer: Self-pay | Admitting: Family Medicine

## 2023-05-28 DIAGNOSIS — I10 Essential (primary) hypertension: Secondary | ICD-10-CM

## 2023-05-28 NOTE — Telephone Encounter (Signed)
 Copied from CRM [id number]. Topic: Clinical - Medication Refill >> May 28, 2023 10:16 AM Rosaria Common wrote: Most Recent Primary Care Visit:  Provider: Eliane Grooms T  Department[facility]  Visit Type: HOSPITAL FU  Date: 03/01/2022  Medication: nebivolol  (BYSTOLIC ) 10 MG tablet   Has the patient contacted their pharmacy? Yes (Agent: If no, request that the patient contact the pharmacy for the refill. If patient does not wish to contact the pharmacy document the reason why and proceed with request.) (Agent: If yes, when and what did the pharmacy advise?)  Is this the correct pharmacy for this prescription? Yes If no, delete pharmacy and type the correct one.  This is the patient's preferred pharmacy:  [id number] [place], [place] - [address] [place] [address] Phone: [phone] Fax: [phone]   Has the prescription been filled recently? No  Is the patient out of the medication? No  Has the patient been seen for an appointment in the last year OR does the patient have an upcoming appointment? No  Can we respond through MyChart? No  Agent: Please be advised that Rx refills may take up to 3 business days. We ask that you follow-up with your pharmacy.

## 2023-05-31 NOTE — Telephone Encounter (Signed)
 Unable to refill per protocol, appointment needed.   Requested Prescriptions  Pending Prescriptions Disp Refills   nebivolol  (BYSTOLIC ) 10 MG tablet 30 tablet 0    Sig: Take 1 tablet (10 mg total) by mouth daily.     Cardiovascular: Beta Blockers 3 Failed - 05/31/2023  8:14 AM      Failed - Cr in normal range and within 360 days    Creatinine  Date Value Ref Range Status  10/08/2013 0.7 0.6 - 1.1 mg/dL Final   Creat  Date Value Ref Range Status  10/22/2021 0.57 0.50 - 1.03 mg/dL Final   Creatinine, Ser  Date Value Ref Range Status  02/19/2022 0.60 0.44 - 1.00 mg/dL Final         Failed - AST in normal range and within 360 days    AST  Date Value Ref Range Status  02/19/2022 22 15 - 41 U/L Final  10/08/2013 26 5 - 34 U/L Final         Failed - ALT in normal range and within 360 days    ALT  Date Value Ref Range Status  02/19/2022 15 0 - 44 U/L Final  10/08/2013 24 0 - 55 U/L Final         Failed - Valid encounter within last 6 months    Recent Outpatient Visits           1 year ago Concussion with loss of consciousness, subsequent encounter   [facility] Austine Lefort, MD   1 year ago Multinodular goiter   [place] [facility] Austine Lefort, MD              Passed - Last BP in normal range    BP Readings from Last 1 Encounters:  08/21/22 125/84         Passed - Last Heart Rate in normal range    Pulse Readings from Last 1 Encounters:  08/21/22 78

## 2023-06-03 ENCOUNTER — Encounter: Payer: Self-pay | Admitting: Family Medicine

## 2023-06-03 ENCOUNTER — Ambulatory Visit: Payer: Self-pay | Admitting: Family Medicine

## 2023-06-03 VITALS — BP 136/82 | HR 84 | Temp 98.2°F | Ht 62.0 in | Wt 311.0 lb

## 2023-06-03 DIAGNOSIS — H8110 Benign paroxysmal vertigo, unspecified ear: Secondary | ICD-10-CM

## 2023-06-03 DIAGNOSIS — F329 Major depressive disorder, single episode, unspecified: Secondary | ICD-10-CM | POA: Diagnosis not present

## 2023-06-03 DIAGNOSIS — Z23 Encounter for immunization: Secondary | ICD-10-CM

## 2023-06-03 DIAGNOSIS — I10 Essential (primary) hypertension: Secondary | ICD-10-CM

## 2023-06-03 DIAGNOSIS — S062XAS 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, sequela: Secondary | ICD-10-CM | POA: Diagnosis not present

## 2023-06-03 LAB — COMPLETE METABOLIC PANEL WITHOUT GFR
AG Ratio: 1.8 (calc) (ref 1.0–2.5)
ALT: 11 U/L (ref 6–29)
AST: 16 U/L (ref 10–35)
Albumin: 4.7 g/dL (ref 3.6–5.1)
Alkaline phosphatase (APISO): 71 U/L (ref 37–153)
BUN: 14 mg/dL (ref 7–25)
CO2: 32 mmol/L (ref 20–32)
Calcium: 9.9 mg/dL (ref 8.6–10.4)
Chloride: 101 mmol/L (ref 98–110)
Creat: 0.75 mg/dL (ref 0.50–1.03)
Globulin: 2.6 g/dL (ref 1.9–3.7)
Glucose, Bld: 78 mg/dL (ref 65–99)
Potassium: 4.5 mmol/L (ref 3.5–5.3)
Sodium: 141 mmol/L (ref 135–146)
Total Bilirubin: 0.5 mg/dL (ref 0.2–1.2)
Total Protein: 7.3 g/dL (ref 6.1–8.1)

## 2023-06-03 LAB — LIPID PANEL
Cholesterol: 184 mg/dL (ref <200)
HDL: 55 mg/dL (ref >=50)
LDL Cholesterol (Calc): 105 mg/dL — ABNORMAL HIGH
Non-HDL Cholesterol (Calc): 129 mg/dL (ref <130)
Total CHOL/HDL Ratio: 3.3 (calc) (ref <5.0)
Triglycerides: 144 mg/dL (ref <150)

## 2023-06-03 LAB — CBC WITH DIFFERENTIAL/PLATELET
Absolute Lymphocytes: 1969 {cells}/uL (ref 850–3900)
Absolute Monocytes: 479 {cells}/uL (ref 200–950)
Basophils Absolute: 50 {cells}/uL (ref 0–200)
Basophils Relative: 0.9 %
Eosinophils Absolute: 83 {cells}/uL (ref 15–500)
Eosinophils Relative: 1.5 %
HCT: 41.3 % (ref 35.0–45.0)
Hemoglobin: 13.7 g/dL (ref 11.7–15.5)
MCH: 28.7 pg (ref 27.0–33.0)
MCHC: 33.2 g/dL (ref 32.0–36.0)
MCV: 86.4 fL (ref 80.0–100.0)
MPV: 9.6 fL (ref 7.5–12.5)
Monocytes Relative: 8.7 %
Neutro Abs: 2921 {cells}/uL (ref 1500–7800)
Neutrophils Relative %: 53.1 %
Platelets: 223 10*3/uL (ref 140–400)
RBC: 4.78 10*6/uL (ref 3.80–5.10)
RDW: 12.9 % (ref 11.0–15.0)
Total Lymphocyte: 35.8 %
WBC: 5.5 10*3/uL (ref 3.8–10.8)

## 2023-06-03 MED ORDER — VENLAFAXINE HCL ER 150 MG PO CP24
150.0000 mg | ORAL_CAPSULE | Freq: Every day | ORAL | 3 refills | Status: AC
Start: 2023-06-03 — End: ?

## 2023-06-03 MED ORDER — NEBIVOLOL HCL 10 MG PO TABS: 10.0000 mg | ORAL_TABLET | Freq: Every day | ORAL | 3 refills | Status: AC

## 2023-06-03 NOTE — Addendum Note (Signed)
 Addended by: Gillermo Lack K on: 06/03/2023 03:07 PM   Modules accepted: Orders

## 2023-06-03 NOTE — Progress Notes (Signed)
 Subjective:    Patient ID: Shannon [name], female    DOB: 29-Oct-1965, 58 y.o.   MRN: [id number]  HPI Patient has a history of hypertension as well as depression.  She is currently on Bystolic  10 mg a day.  Her blood pressure is well-controlled today.  She denies any chest pain or shortness of breath.  She is also on venlafaxine  for anxiety as well as depression.  She feels that the medication is working well for her..  Her stress level has improved.  She appears to be due for mammogram.  She would like to call the breast center and schedule this herself.  Colonoscopy is up-to-date.  She is due for the pneumonia vaccine as well as the shingles vaccine.  Past Medical History:  Diagnosis Date   Cancer [facility])    Uterine cancer dx. surgery planned TAH   Hypertension    not taking med x1 yr. couldn't afford.   Multiple thyroid  nodules    - FNA 2019, surgery recommends annual US    Ovarian cancer (HCC)    PONV (postoperative nausea and vomiting)    Primary hyperparathyroidism (HCC)    Uterine cancer (HCC)    UTI (lower urinary tract infection)    history of 1 month ago   Past Surgical History:  Procedure Laterality Date   BIOPSY  03/08/2021   Procedure: BIOPSY;  Surgeon: Daina Drum, MD;  Location: [facility];  Service: Gastroenterology;;   CHOLECYSTECTOMY     laparoscopic   COLONOSCOPY WITH PROPOFOL  N/A 03/08/2021   Procedure: COLONOSCOPY WITH PROPOFOL ;  Surgeon: Daina Drum, MD;  Location: [facility];  Service: Gastroenterology;  Laterality: N/A;   PARATHYROIDECTOMY Right 02/07/2017   Procedure: MINIMALLY INVASIVE PARATHYROIDECTOMY;  Surgeon: Boyce Byes, MD;  Location: [facility];  Service: General;  Laterality: Right;   POLYPECTOMY  03/08/2021   Procedure: POLYPECTOMY;  Surgeon: Daina Drum, MD;  Location: [facility];  Service: Gastroenterology;;   ROBOTIC ASSISTED TOTAL HYSTERECTOMY WITH BILATERAL SALPINGO OOPHERECTOMY Bilateral 09/28/2013   Procedure: ROBOTIC ASSISTED TOTAL  HYSTERECTOMY WITH BILATERAL SALPINGO OOPHORECTOMY WITH  LYMPH NODE DISECTION, ;  Surgeon: Alphonso Aschoff, MD;  Location: [facility];  Service: Gynecology;  Laterality: Bilateral;   Current Outpatient Medications on File Prior to Visit  Medication Sig Dispense Refill   acetaminophen  (TYLENOL ) 500 MG tablet Take 2 tablets (1,000 mg total) by mouth every 8 (eight) hours as needed. 30 tablet 0   Carboxymethylcellul-Glycerin (LUBRICATING EYE DROPS OP) Place 1 drop into both eyes daily as needed (dry eyes).     cetirizine (ZYRTEC) 10 MG tablet Take 10 mg by mouth daily.     loperamide (IMODIUM A-D) 2 MG tablet Take 1 mg by mouth daily.     ondansetron  (ZOFRAN ) 4 MG tablet Take 1 tablet (4 mg total) by mouth every 6 (six) hours. 12 tablet 0   No current facility-administered medications on file prior to visit.   Allergies  Allergen Reactions   Contrast Media [Iodinated Contrast Media] Anaphylaxis and Other (See Comments)    Swelling of the throat, difficulty breathing   Tetanus Toxoids Swelling and Other (See Comments)    Swelling at site, lethargic x few days   Social History   Socioeconomic History   Marital status: Single    Spouse name: Not on file   Number of children: Not on file   Years of education: Not on file   Highest education level: Not on file  Occupational History   Not on  file  Tobacco Use   Smoking status: Never   Smokeless tobacco: Never  Vaping Use   Vaping status: Never Used  Substance and Sexual Activity   Alcohol use: No   Drug use: No   Sexual activity: Not Currently  Other Topics Concern   Not on file  Social History Narrative   Not on file   Social Drivers of Health   Financial Resource Strain: Not on file  Food Insecurity: Not on file  Transportation Needs: Not on file  Physical Activity: Not on file  Stress: Not on file  Social Connections: Not on file  Intimate Partner Violence: Not on file    Review of Systems     Objective:   Physical  Exam Constitutional:      Appearance: Normal appearance. She is obese.  Cardiovascular:     Rate and Rhythm: Normal rate and regular rhythm.     Heart sounds: Normal heart sounds.  Pulmonary:     Effort: Pulmonary effort is normal. No respiratory distress.     Breath sounds: Normal breath sounds. No stridor. No wheezing, rhonchi or rales.  Chest:     Chest wall: No tenderness.  Abdominal:     General: Abdomen is flat. Bowel sounds are normal. There is no distension.     Tenderness: There is no abdominal tenderness.  Neurological:     General: No focal deficit present.     Mental Status: She is alert and oriented to person, place, and time. Mental status is at baseline.     Cranial Nerves: No cranial nerve deficit.     Motor: No weakness.     Gait: Gait normal.           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  Therefore refill the medication.  No changes are necessary as she feels that it is working well.  Blood pressure is well-controlled.  Did recommend weight loss.  Discussed Zepbound but the patient is unable to afford that.  Check CBC, CMP, and fasting lipid panel.  Patient will schedule her own mammogram.  Colonoscopy is up-to-date.  She defers the shingles vaccine.  Patient received Prevnar 20 today.

## 2023-09-23 IMAGING — MG MM DIGITAL SCREENING BILAT W/ TOMO AND CAD
6 of 10 series · 6 of 30 positions shown · non-contrast
Comparison: Previous exam(s).

CLINICAL DATA: Screening.

EXAM:
DIGITAL SCREENING BILATERAL MAMMOGRAM WITH TOMOSYNTHESIS AND CAD
TECHNIQUE: Bilateral screening digital craniocaudal and mediolateral oblique
mammograms were obtained. Bilateral screening digital breast
tomosynthesis was performed. The images were evaluated with
computer-aided detection.

[R CC synth-2D]
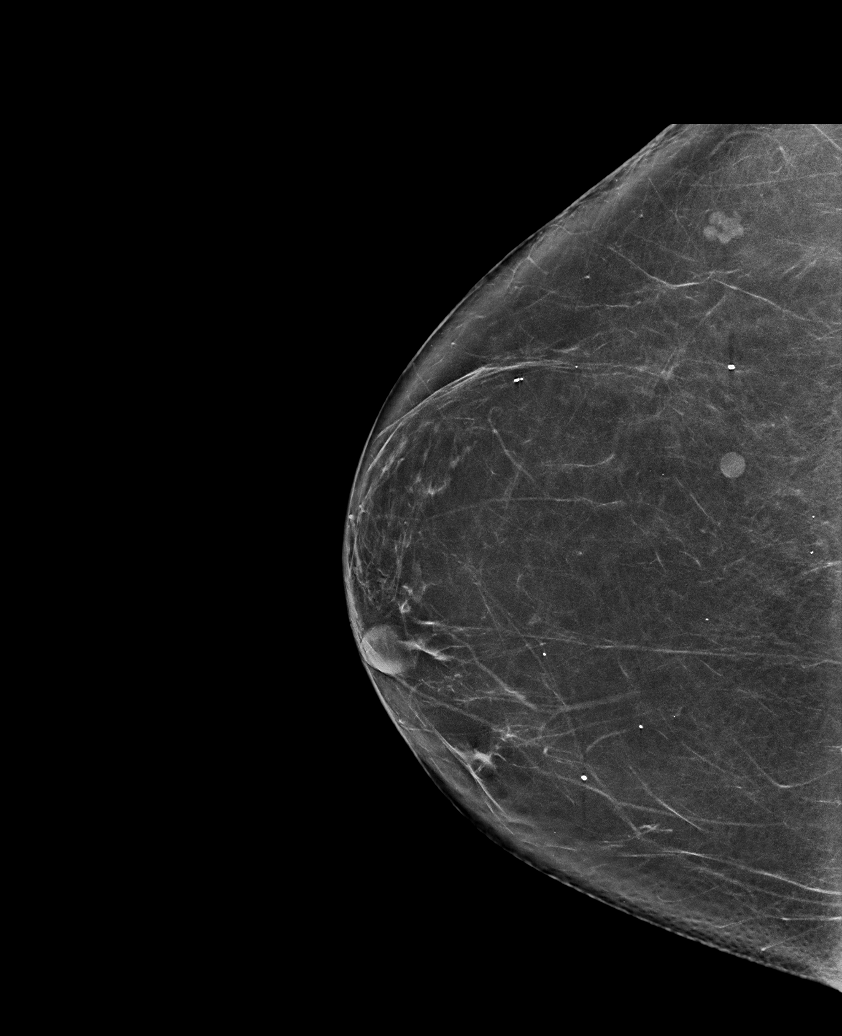

[L MLO synth-2D]
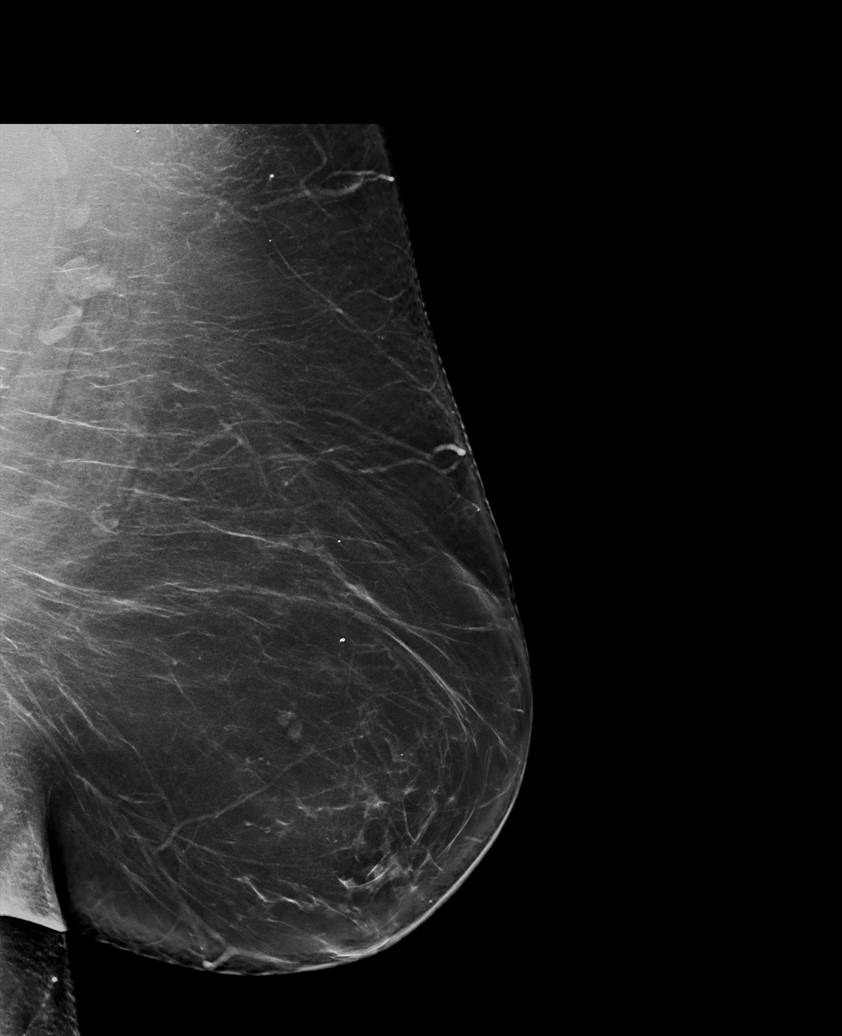

[L CC synth-2D (1 of 2)]
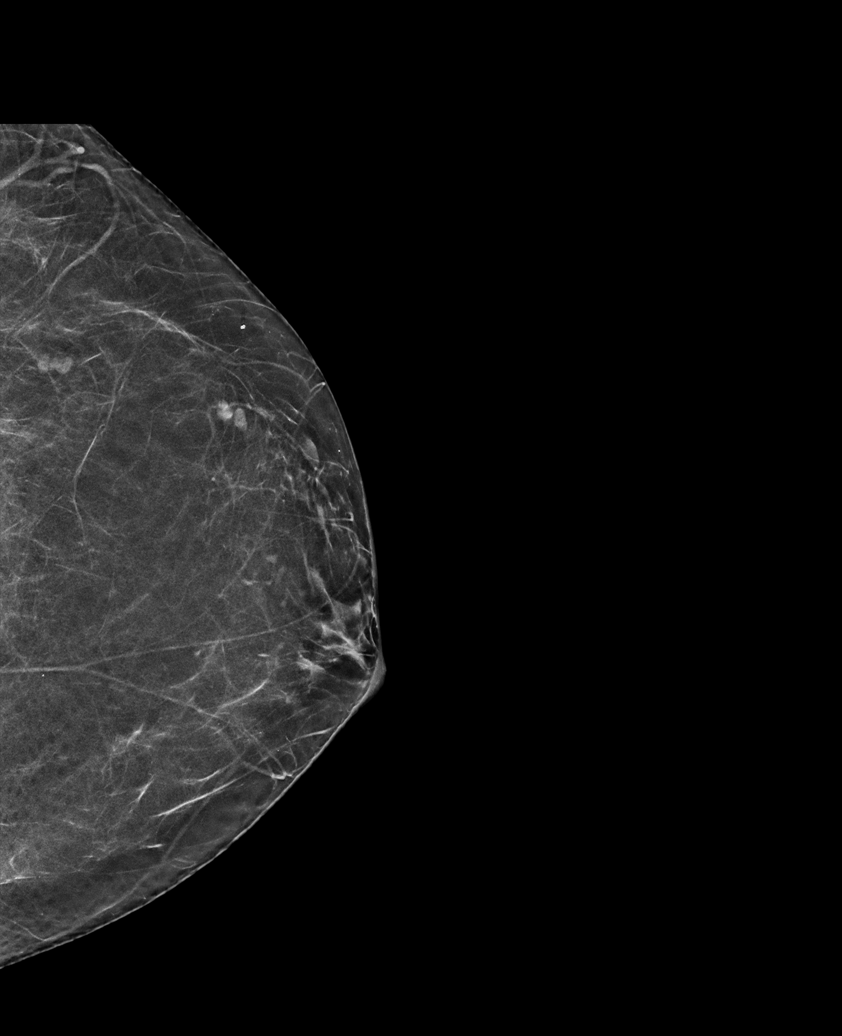

[L CC synth-2D (2 of 2)]
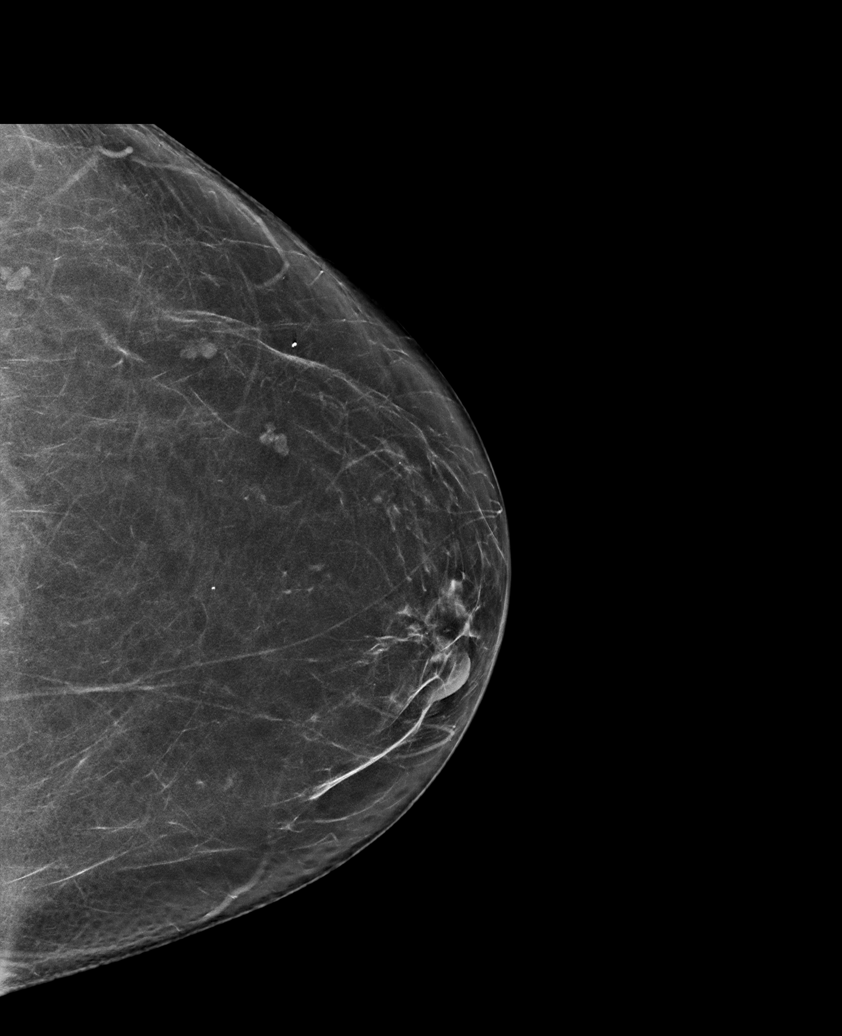

[R MLO synth-2D]
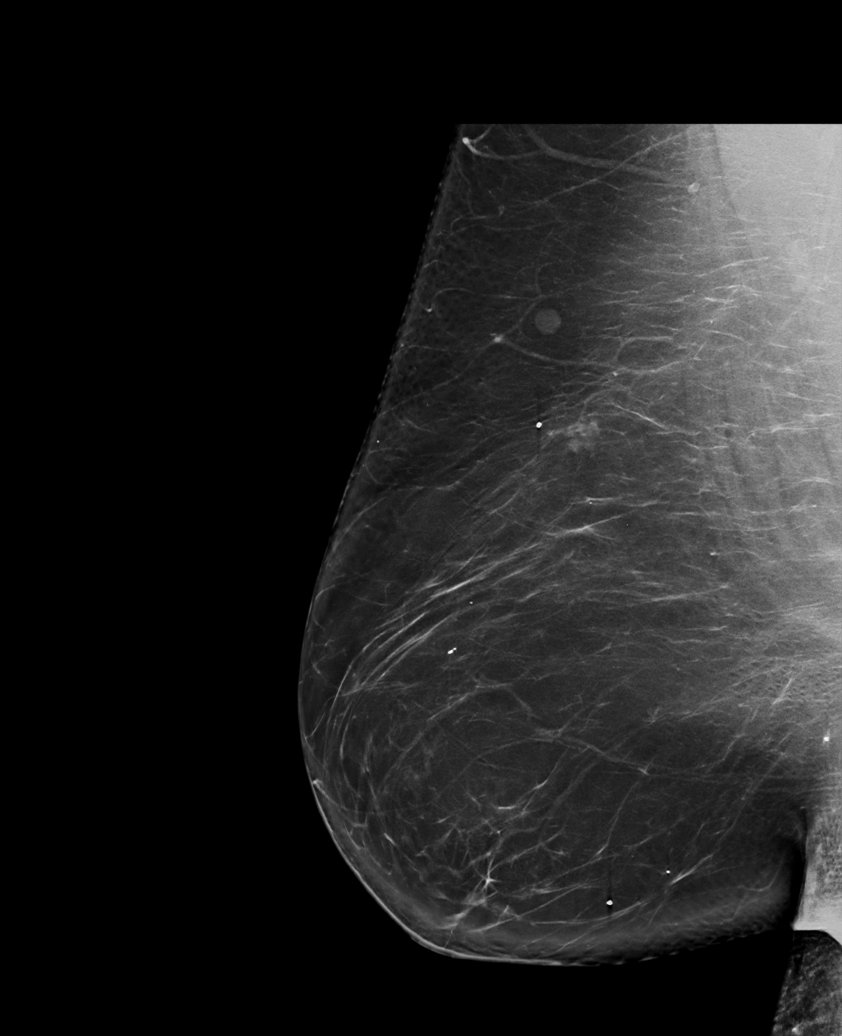

[R MLO tomo · tomo slice 47/94.0]
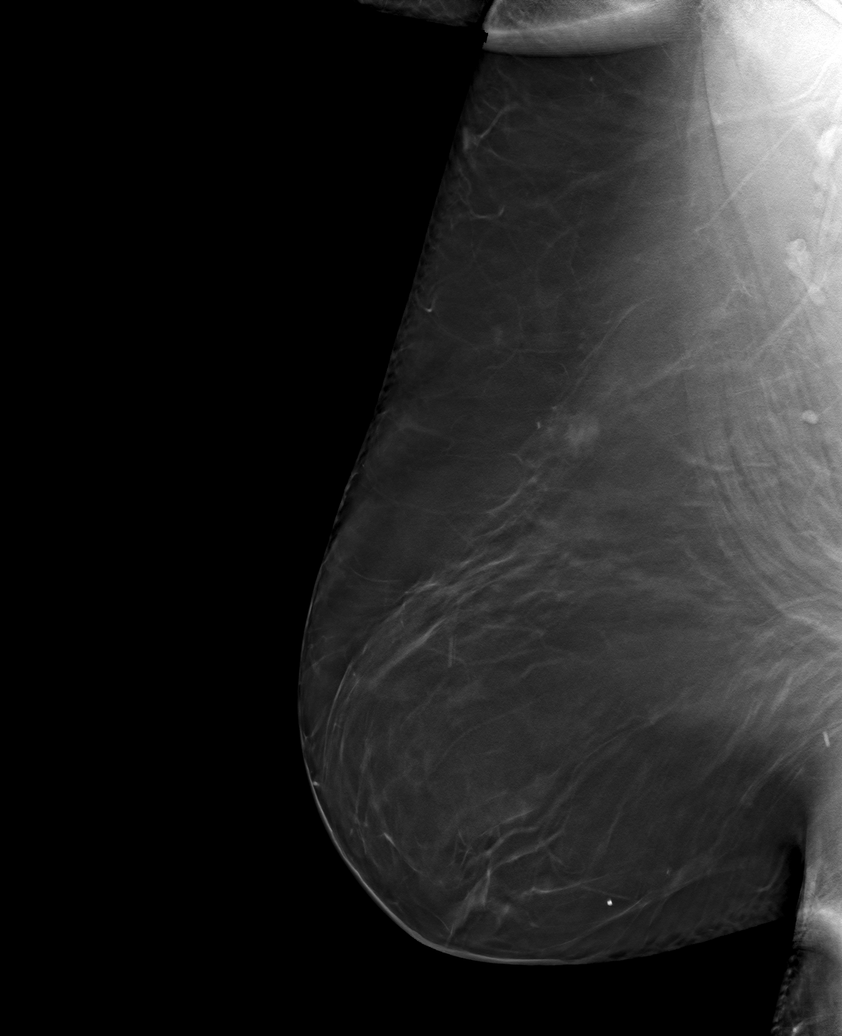

[6 of 30 positions shown; findings below may reference images not displayed]

ACR Breast Density Category b: There are scattered areas of
fibroglandular density.
FINDINGS: There are no findings suspicious for malignancy.
IMPRESSION: No mammographic evidence of malignancy. A result letter of this
screening mammogram will be mailed directly to the patient.

RECOMMENDATION:
Screening mammogram in one year. (Code:51-O-LD2)

BI-RADS CATEGORY  1: Negative.
